# Patient Record
Sex: Female | Born: 1980
Health system: Southern US, Community
[De-identification: ages and names within clinical notes are randomized; demographics above are authoritative.]

## PROBLEM LIST (undated history)

## (undated) ENCOUNTER — Ambulatory Visit: Admission: EM | Discharge status: Absent without leave | Payer: 59

## (undated) DIAGNOSIS — F329 Major depressive disorder, single episode, unspecified: Secondary | ICD-10-CM

## (undated) DIAGNOSIS — K219 Gastro-esophageal reflux disease without esophagitis: Secondary | ICD-10-CM

## (undated) DIAGNOSIS — Z91018 Allergy to other foods: Secondary | ICD-10-CM

## (undated) DIAGNOSIS — M2550 Pain in unspecified joint: Secondary | ICD-10-CM

## (undated) DIAGNOSIS — E282 Polycystic ovarian syndrome: Secondary | ICD-10-CM

## (undated) DIAGNOSIS — N979 Female infertility, unspecified: Secondary | ICD-10-CM

## (undated) DIAGNOSIS — C4490 Unspecified malignant neoplasm of skin, unspecified: Secondary | ICD-10-CM

## (undated) DIAGNOSIS — I10 Essential (primary) hypertension: Secondary | ICD-10-CM

## (undated) DIAGNOSIS — E88819 Insulin resistance, unspecified: Secondary | ICD-10-CM

## (undated) DIAGNOSIS — F419 Anxiety disorder, unspecified: Secondary | ICD-10-CM

## (undated) DIAGNOSIS — E559 Vitamin D deficiency, unspecified: Secondary | ICD-10-CM

## (undated) DIAGNOSIS — E669 Obesity, unspecified: Secondary | ICD-10-CM

## (undated) DIAGNOSIS — N912 Amenorrhea, unspecified: Secondary | ICD-10-CM

## (undated) DIAGNOSIS — F32A Depression, unspecified: Secondary | ICD-10-CM

## (undated) DIAGNOSIS — F1010 Alcohol abuse, uncomplicated: Secondary | ICD-10-CM

## (undated) DIAGNOSIS — R0683 Snoring: Secondary | ICD-10-CM

## (undated) DIAGNOSIS — E8881 Metabolic syndrome: Secondary | ICD-10-CM

## (undated) DIAGNOSIS — G43109 Migraine with aura, not intractable, without status migrainosus: Secondary | ICD-10-CM

## (undated) DIAGNOSIS — E7800 Pure hypercholesterolemia, unspecified: Secondary | ICD-10-CM

## (undated) HISTORY — DX: Metabolic syndrome: E88.81

## (undated) HISTORY — DX: Migraine with aura, not intractable, without status migrainosus: G43.109

## (undated) HISTORY — DX: Vitamin D deficiency, unspecified: E55.9

## (undated) HISTORY — DX: Depression, unspecified: F32.A

## (undated) HISTORY — DX: Anxiety disorder, unspecified: F41.9

## (undated) HISTORY — DX: Snoring: R06.83

## (undated) HISTORY — DX: Insulin resistance, unspecified: E88.819

## (undated) HISTORY — DX: Allergy to other foods: Z91.018

## (undated) HISTORY — DX: Pure hypercholesterolemia, unspecified: E78.00

## (undated) HISTORY — DX: Major depressive disorder, single episode, unspecified: F32.9

## (undated) HISTORY — DX: Pain in unspecified joint: M25.50

## (undated) HISTORY — DX: Obesity, unspecified: E66.9

## (undated) HISTORY — DX: Amenorrhea, unspecified: N91.2

## (undated) HISTORY — DX: Polycystic ovarian syndrome: E28.2

## (undated) HISTORY — PX: WISDOM TOOTH EXTRACTION: SHX21

## (undated) HISTORY — DX: Female infertility, unspecified: N97.9

## (undated) HISTORY — DX: Alcohol abuse, uncomplicated: F10.10

## (undated) HISTORY — DX: Gastro-esophageal reflux disease without esophagitis: K21.9

## (undated) HISTORY — DX: Essential (primary) hypertension: I10

---

## 2001-09-30 ENCOUNTER — Other Ambulatory Visit: Admission: RE | Admit: 2001-09-30 | Discharge: 2001-09-30 | Payer: Self-pay | Admitting: Family Medicine

## 2004-06-21 ENCOUNTER — Ambulatory Visit: Payer: Self-pay | Admitting: Family Medicine

## 2004-07-09 DIAGNOSIS — N912 Amenorrhea, unspecified: Secondary | ICD-10-CM

## 2004-07-09 DIAGNOSIS — G43109 Migraine with aura, not intractable, without status migrainosus: Secondary | ICD-10-CM

## 2004-07-09 HISTORY — DX: Migraine with aura, not intractable, without status migrainosus: G43.109

## 2004-07-09 HISTORY — DX: Amenorrhea, unspecified: N91.2

## 2006-07-09 HISTORY — PX: SKIN CANCER DESTRUCTION: SHX778

## 2013-06-09 ENCOUNTER — Ambulatory Visit: Payer: Self-pay

## 2013-06-09 LAB — HM PAP SMEAR: HM Pap smear: NORMAL

## 2013-06-11 ENCOUNTER — Ambulatory Visit: Payer: Self-pay | Admitting: Family Medicine

## 2013-06-11 LAB — COMPREHENSIVE METABOLIC PANEL
Albumin: 3.9 g/dL (ref 3.4–5.0)
Alkaline Phosphatase: 52 U/L
Anion Gap: 9 (ref 7–16)
BUN: 11 mg/dL (ref 7–18)
Bilirubin,Total: 0.4 mg/dL (ref 0.2–1.0)
Calcium, Total: 8.7 mg/dL (ref 8.5–10.1)
Co2: 27 mmol/L (ref 21–32)
Creatinine: 0.79 mg/dL (ref 0.60–1.30)
Glucose: 96 mg/dL (ref 65–99)
Osmolality: 275 (ref 275–301)
Potassium: 3.8 mmol/L (ref 3.5–5.1)

## 2013-06-11 LAB — CBC WITH DIFFERENTIAL/PLATELET
Basophil #: 0 10*3/uL (ref 0.0–0.1)
Eosinophil #: 0.1 10*3/uL (ref 0.0–0.7)
HCT: 41.6 % (ref 35.0–47.0)
HGB: 14 g/dL (ref 12.0–16.0)
Lymphocyte #: 2.6 10*3/uL (ref 1.0–3.6)
MCHC: 33.7 g/dL (ref 32.0–36.0)
Monocyte #: 0.4 x10 3/mm (ref 0.2–0.9)
Monocyte %: 6.1 %
Neutrophil #: 3.9 10*3/uL (ref 1.4–6.5)
Neutrophil %: 55 %
RDW: 12.5 % (ref 11.5–14.5)
WBC: 7.1 10*3/uL (ref 3.6–11.0)

## 2013-06-11 LAB — TSH: Thyroid Stimulating Horm: 1.06 u[IU]/mL

## 2013-06-11 LAB — LIPID PANEL
Cholesterol: 175 mg/dL (ref 0–200)
Ldl Cholesterol, Calc: 123 mg/dL — ABNORMAL HIGH (ref 0–100)
Triglycerides: 52 mg/dL (ref 0–200)
VLDL Cholesterol, Calc: 10 mg/dL (ref 5–40)

## 2013-07-09 ENCOUNTER — Ambulatory Visit: Payer: Self-pay

## 2013-10-30 ENCOUNTER — Ambulatory Visit: Payer: Self-pay | Admitting: Family Medicine

## 2013-11-06 ENCOUNTER — Ambulatory Visit: Payer: Self-pay | Admitting: Family Medicine

## 2013-12-14 ENCOUNTER — Ambulatory Visit: Payer: Self-pay | Admitting: Family Medicine

## 2014-01-06 ENCOUNTER — Ambulatory Visit: Payer: Self-pay | Admitting: Family Medicine

## 2014-07-25 ENCOUNTER — Ambulatory Visit: Payer: Self-pay | Admitting: Physician Assistant

## 2014-11-07 ENCOUNTER — Ambulatory Visit
Admission: EM | Admit: 2014-11-07 | Discharge: 2014-11-07 | Disposition: A | Discharge status: Home / Self Care | Payer: 59 | Attending: Internal Medicine | Admitting: Internal Medicine

## 2014-11-07 ENCOUNTER — Ambulatory Visit: Payer: 59

## 2014-11-07 DIAGNOSIS — M2011 Hallux valgus (acquired), right foot: Secondary | ICD-10-CM | POA: Insufficient documentation

## 2014-11-07 DIAGNOSIS — M79671 Pain in right foot: Secondary | ICD-10-CM | POA: Diagnosis present

## 2014-11-07 DIAGNOSIS — Z85828 Personal history of other malignant neoplasm of skin: Secondary | ICD-10-CM | POA: Insufficient documentation

## 2014-11-07 DIAGNOSIS — Z833 Family history of diabetes mellitus: Secondary | ICD-10-CM | POA: Diagnosis present

## 2014-11-07 DIAGNOSIS — Z87891 Personal history of nicotine dependence: Secondary | ICD-10-CM | POA: Insufficient documentation

## 2014-11-07 DIAGNOSIS — Z8249 Family history of ischemic heart disease and other diseases of the circulatory system: Secondary | ICD-10-CM | POA: Diagnosis not present

## 2014-11-07 DIAGNOSIS — Z79899 Other long term (current) drug therapy: Secondary | ICD-10-CM | POA: Diagnosis present

## 2014-11-07 DIAGNOSIS — M21611 Bunion of right foot: Secondary | ICD-10-CM

## 2014-11-07 HISTORY — DX: Unspecified malignant neoplasm of skin, unspecified: C44.90

## 2014-11-07 NOTE — Discharge Instructions (Signed)

## 2014-11-07 NOTE — ED Provider Notes (Signed)
CSN: [id number]     Arrival date & time 11/07/14  1655 History   First MD Initiated Contact with Patient 11/07/14 1032     Chief Complaint  Patient presents with  . Foot Pain   (Consider location/radiation/quality/duration/timing/severity/associated sxs/prior Treatment) HPI Comments: Right foot pain worsening over past year  The history is provided by the patient.   Patient denied trauma, has noticed swelling and radiating pain across top of right foot.  Tried shoes with large toebox no improvement. Past Medical History  Diagnosis Date  . Skin cancer    History reviewed. No pertinent past surgical history. Family History  Problem Relation Age of Onset  . Hypertension Father   . Diabetes Father    History  Substance Use Topics  . Smoking status: Former Smoker    Quit date: 07/10/2003  . Smokeless tobacco: Never Used  . Alcohol Use: 1.2 oz/week    1 Glasses of wine, 1 Cans of beer per week   OB History    No data available     Review of Systems  Constitutional: Negative.   HENT: Negative.   Eyes: Negative.   Respiratory: Negative.   Cardiovascular: Negative.   Gastrointestinal: Negative.   Musculoskeletal: Positive for joint swelling and arthralgias.  Skin: Negative.   Neurological: Negative.   Hematological: Negative.     Allergies  Celery oil; Codeine; and Pollen extract  Home Medications   Prior to Admission medications   Medication Sig Start Date End Date Taking? Authorizing Provider  citalopram (CELEXA) 10 MG tablet Take 10 mg by mouth daily.   Yes Historical Provider, MD  Multiple Vitamin (MULTIVITAMIN WITH MINERALS) TABS tablet Take 1 tablet by mouth daily.   Yes Historical Provider, MD   BP 117/74 mmHg  Pulse 80  Temp(Src) 97.1 F (36.2 C) (Tympanic)  Resp 16  Ht 5\' 3"  (1.6 m)  Wt 193 lb (87.544 kg)  BMI 34.20 kg/m2  SpO2 99%  LMP  (Within Weeks) Physical Exam  Constitutional: She is oriented to person, place, and time. She appears  well-developed and well-nourished. No distress.  HENT:  Head: Normocephalic and atraumatic.  Eyes: Conjunctivae and EOM are normal. Pupils are equal, round, and reactive to light.  Neck: Normal range of motion. Neck supple.  Cardiovascular: Normal rate, regular rhythm and intact distal pulses.   Pulmonary/Chest: Breath sounds normal.  Musculoskeletal: Normal range of motion.       Feet:  Neurological: She is alert and oriented to person, place, and time. She has normal reflexes.  Skin: Skin is warm and dry. She is not diaphoretic.  Psychiatric: She has a normal mood and affect. Her behavior is normal. Judgment and thought content normal.  Nursing note and vitals reviewed.   ED Course  Procedures (including critical care time) Labs Review Labs Reviewed - No data to display Discussed radiology results with patient and given copy of report.  Discussed to follow up with [facility] for podiatry referral if required by insurance or schedule on own at provider of choice.  May request copy of foot xray images from radiology dept on disk.  May start motrin 800mg  po TID or tylenol 1000mg  po QID prn pain, heat, stretches and elevation at home, ensure toe box shoes not pinching toes.  Patient verbalized understanding of information/instructions, agreed with plan of care and had no further questions at this time.  Imaging Review Dg Foot Complete Right  11/07/2014   CLINICAL DATA:  Metatarsal phalangeal joint pain and swelling, with  progression over past year. No known injury.  EXAM: RIGHT FOOT COMPLETE - 3+ VIEW  COMPARISON:  None.  FINDINGS: There is no evidence of fracture or dislocation.  Mild to moderate hallux valgus is seen with bony bunion formation involving the distal first metatarsal. No other osseous abnormality identified.  IMPRESSION: No acute findings.  Mild to moderate hallux valgus with bony bunion.   Electronically Signed   By: Earle Gell M.D.   On: 11/07/2014 14:31     MDM   1. Hallux  valgus with bunions of right foot       Olen Cordial, NP 11/07/14 1949

## 2014-11-07 NOTE — ED Notes (Signed)
Right foot pain thinks she is having a bunion on big toe inside portion.

## 2014-12-14 ENCOUNTER — Ambulatory Visit: Payer: Self-pay | Admitting: Podiatry

## 2014-12-31 ENCOUNTER — Encounter: Payer: Self-pay | Admitting: Family Medicine

## 2015-01-31 ENCOUNTER — Ambulatory Visit
Admission: EM | Admit: 2015-01-31 | Discharge: 2015-01-31 | Disposition: A | Discharge status: Home / Self Care | Payer: 59 | Attending: Family Medicine | Admitting: Family Medicine

## 2015-01-31 DIAGNOSIS — S93401A Sprain of unspecified ligament of right ankle, initial encounter: Secondary | ICD-10-CM | POA: Diagnosis not present

## 2015-01-31 MED ORDER — ACETAMINOPHEN 500 MG PO TABS: 500.0000 mg | ORAL_TABLET | Freq: Four times a day (QID) | ORAL | Status: DC | PRN

## 2015-01-31 NOTE — ED Notes (Signed)
States twisted right ankle Saturday afternoon. Painful to move but not when bearing weight. + swelling right outer ankle

## 2015-01-31 NOTE — Discharge Instructions (Signed)

## 2015-01-31 NOTE — ED Provider Notes (Signed)
CSN: [id number]     Arrival date & time 01/31/15  1256 History   First MD Initiated Contact with Patient 01/31/15 1312     Chief Complaint  Patient presents with  . 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  Has been walking in exercise shoes yesterday and today without difficulty unless she makes sharp movement.  Iced and elevated yesterday at home.  Denied bruising right foot, leg pain, toe pain or injuring any other extremities, previous sprain to right ankle.  Patient is a 34 y.o. female presenting with ankle pain. The history is provided by the patient.  Ankle Pain Location:  Ankle Time since incident:  36 hours Injury: yes   Ankle location:  R ankle Pain details:    Quality:  Aching   Radiates to:  Does not radiate   Severity:  Mild   Onset quality:  Sudden   Duration:  36 hours   Timing:  Intermittent   Progression:  Unchanged Chronicity:  New Dislocation: no   Foreign body present:  No foreign bodies Tetanus status:  Up to date Prior injury to area:  No Relieved by:  Rest, ice and elevation Worsened by:  Rotation Ineffective treatments:  Rest, ice and elevation Associated symptoms: swelling   Associated symptoms: no back pain, no decreased ROM, no fatigue, no fever, no itching, no muscle weakness, no neck pain, no numbness and no tingling   Risk factors: obesity   Risk factors: no concern for non-accidental trauma, no frequent fractures, no known bone disorder and no recent illness     Past Medical History  Diagnosis Date  . Skin cancer    History reviewed. No pertinent past surgical history. Family History  Problem Relation Age of Onset  . Hypertension Father   . Diabetes Father    History  Substance  Use Topics  . Smoking status: Former Smoker    Quit date: 07/10/2003  . Smokeless tobacco: Never Used  . Alcohol Use: 1.2 oz/week    1 Glasses of wine, 1 Cans of beer per week     Comment: socially   OB History    No data available     Review of Systems  Constitutional: Negative for fever, chills, diaphoresis, activity change, appetite change and fatigue.  HENT: Negative for dental problem, ear discharge and nosebleeds.   Eyes: Negative for discharge and itching.  Respiratory: Negative for cough and wheezing.   Cardiovascular: Negative for chest pain and leg swelling.  Gastrointestinal: Negative for nausea, vomiting, abdominal pain, diarrhea, constipation, blood in stool and abdominal distention.  Endocrine: Negative for cold intolerance and heat intolerance.  Genitourinary: Negative for dysuria and hematuria.  Musculoskeletal: Positive for joint swelling. Negative for myalgias, back pain, arthralgias, gait problem, neck pain and neck stiffness.  Skin: Negative for color change, itching, pallor, rash and wound.  Allergic/Immunologic: Negative for environmental allergies and food allergies.  Neurological: Negative for dizziness, tremors, syncope, facial asymmetry, weakness, light-headedness and headaches.  Hematological: Negative for adenopathy. Does not bruise/bleed easily.  Psychiatric/Behavioral: Negative for behavioral problems, confusion, sleep disturbance and agitation.    Allergies  Celery oil; Codeine; and Pollen extract  Home Medications   Prior to Admission medications   Medication Sig Start Date End Date Taking? Authorizing Provider  citalopram (CELEXA) 10 MG tablet Take 10 mg by  mouth daily.   Yes Historical Provider, MD  Garlic 510 MG TABS Take by mouth.   Yes Historical Provider, MD  Multiple Vitamin (MULTIVITAMIN WITH MINERALS) TABS tablet Take 1 tablet by mouth daily.   Yes Historical Provider, MD  acetaminophen (TYLENOL) 500 MG tablet Take 1 tablet (500 mg  total) by mouth every 6 (six) hours as needed. 01/31/15   Aura Fey Betancourt, NP   BP 121/60 mmHg  Pulse 74  Temp(Src) 98.5 F (36.9 C) (Oral)  Resp 16  Ht 5\' 3"  (1.6 m)  Wt 190 lb (86.183 kg)  BMI 33.67 kg/m2  SpO2 99%  LMP 01/02/2015 (Approximate) Physical Exam  Constitutional: She is oriented to person, place, and time. Vital signs are normal. She appears well-developed and well-nourished. No distress.  HENT:  Head: Normocephalic and atraumatic.  Right Ear: External ear normal.  Left Ear: External ear normal.  Nose: Nose normal.  Mouth/Throat: Oropharynx is clear and moist.  Eyes: Conjunctivae, EOM and lids are normal. Pupils are equal, round, and reactive to light. Right eye exhibits no discharge. Left eye exhibits no discharge. No scleral icterus.  Neck: Trachea normal and normal range of motion. Neck supple. No tracheal deviation present.  Cardiovascular: Normal rate, regular rhythm and intact distal pulses.   Pulmonary/Chest: Effort normal and breath sounds normal. No stridor. No respiratory distress. She has no wheezes.  Abdominal: Soft. She exhibits no distension.  Musculoskeletal: She exhibits edema and tenderness.       Right knee: Normal.       Left knee: Normal.       Right ankle: She exhibits decreased range of motion and swelling. She exhibits no ecchymosis, no deformity, no laceration and normal pulse. Tenderness. AITFL tenderness found. No lateral malleolus, no medial malleolus, no CF ligament, no posterior TFL, no head of 5th metatarsal and no proximal fibula tenderness found. Achilles tendon normal. Achilles tendon exhibits no pain and no defect.       Left ankle: Normal.       Right lower leg: Normal.       Left lower leg: Normal.       Right foot: Normal.       Left foot: Normal.       Feet:  Neurological: She is alert and oriented to person, place, and time. She exhibits normal muscle tone. Coordination normal.  Skin: Skin is warm, dry and intact. No abrasion,  no bruising, no burn, no ecchymosis, no laceration, no lesion, no petechiae and no rash noted. She is not diaphoretic. No erythema. No pallor.  Psychiatric: She has a normal mood and affect. Her speech is normal and behavior is normal. Judgment and thought content normal. Cognition and memory are normal.  Nursing note and vitals reviewed.   ED Course  Procedures (including critical care time) Labs Review Labs Reviewed - No data to display  Imaging Review No results found.   MDM   1. Ankle sprain, right, initial encounter    Patient was instructed to rest, ice and elevate the ankle as much as possible.  Activity as tolerated and work on ROM exercises.  Patient is to take NSAIDS as needed tylenol 1000mg  po QID prn.  Discussed at risk to reinjure ankle over the next year and to wear supportive footwear/ankle sleeve/ace bandage.  Follow up with PCM if symptoms persist greater than 4 weeks for re-evaluation.  Patient refused work note and offer of crutches discussed if changing gait can cause foot/ankle tendonitis in addition  to sprain.  Follow up for re-evaluation in 7 to 10 days if symptoms worsening.  Patient verbalized agreement and understanding of treatment plan.   P2:  Injury Prevention and Fitness.    Olen Cordial, NP 01/31/15 [phone]

## 2015-10-06 ENCOUNTER — Encounter: Payer: 59 | Attending: Family Medicine | Admitting: Dietician

## 2015-10-06 ENCOUNTER — Encounter: Payer: Self-pay | Admitting: Dietician

## 2015-10-06 VITALS — Ht 63.0 in | Wt 200.7 lb

## 2015-10-06 DIAGNOSIS — Z713 Dietary counseling and surveillance: Secondary | ICD-10-CM | POA: Diagnosis not present

## 2015-10-06 DIAGNOSIS — E669 Obesity, unspecified: Secondary | ICD-10-CM

## 2015-10-06 NOTE — Progress Notes (Signed)
Medical Nutrition Therapy:  Appt start time: [id number] end time:  [id number].   Assessment:  Primary concerns today: Leah Brown is here today since she is having a hard time losing weight. Feeling very frustrated. Weight has been the same for the past 6-12 months. Normal weight is 180 lbs for the past 10 years and she was 170 lbs about a year ago. Has cut back on soda and is drinking sparkling water. Other than that cannot figure out what is different about what she was doing then versus now. Had a new job that was stressful but is getting less so. Moves less with this job than previous job. Exercises now when she used to not. Feeling hungry all of the time which is she used to feel also. Doesn't feel full when she eats. Feels like portion sizes are large. Eats meals quickly, when she is driving, and eats lunch and dinner talking with someone.   Describes herself as a "caffeine addict" and takes a supplement each day. Feels sluggish if she doesn't take it. Gets 7-8 hours of sleep per night. Has been tested for sleep apnea 3 or more years ago and did not have it. Thyroid was normal several years ago. Was negative for PCOS.   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 Lives with her boyfriend and states that he does the meal preparation at home. Has been doing small or no dinner recently. Eats out 1-2 meals per week.   Family members tend to be on the larger size. Would like to be able to do yoga, be healthy, and feel better in her own skin. Felt best around 150 lbs.    Preferred Learning Style:   No preference indicated   Learning Readiness:   Ready  MEDICATIONS: see list   DIETARY INTAKE:  Usual eating pattern includes 3 meals and 2 snacks per day.  Avoided foods include: doesn't like a lot of meat, almonds   24-hr recall:  B (8 AM): V8 juice, bagel or toast plain or butter  Snk ( 10-10:30 AM): granola bar   L ( PM): lean meat and vegetable - salad with meat  sometimes with a carb Snk ( PM): on drive home - apple or banana D ( PM): red meat and vegetable or protein shake or cheese stick with crackers Snk ( PM): none Beverages: water, coffee with 1/2 and 1/2 cream and sometimes sugar, sparkling water, coconut water, hot tea   Usual physical activity: 2 x week - cardio sculpt, spin, and pump and sculpt  Estimated energy needs: 1800 calories 200 g carbohydrates 135 g protein 50 g fat  Progress Towards Goal(s):  In progress.   Nutritional Diagnosis:  [place]-3.3 Overweight/obesity As related to hx of large portion sizing and eating quickly.  As evidenced by BMI of 35.6.    Intervention:  Nutrition counseling provided. Plan: Fill up half of your plate with vegetables at lunch and dinner meals.  Have a quarter of your plate protein and a quarter of your plate starch or fruit. Try adding and egg or other protein breakfast meals/snacks (egg, protein shake, nuts, cheese).  Try using small plates to help with portion sizes. Aim to take 20 minutes to eat meals. Chew 20-30 x per bite and eat without distractions when possible. Enjoy your food! Pay attention to hunger/fullness feelings. Eat when you are hungry and stop when you have had enough.  Teaching Method Utilized:  Ship broker  Hands on  Handouts given during visit include:  MyPlate Handout  15 g CHO Snacks  Barriers to learning/adherence to lifestyle change: none  Demonstrated degree of understanding via:  Teach Back   Monitoring/Evaluation:  Dietary intake, exercise, and body weight in 1 month(s).

## 2015-10-06 NOTE — Patient Instructions (Signed)
Fill up half of your plate with vegetables at lunch and dinner meals.  Have a quarter of your plate protein and a quarter of your plate starch or fruit. Try adding and egg or other protein breakfast meals/snacks (egg, protein shake, nuts, cheese).  Try using small plates to help with portion sizes. Aim to take 20 minutes to eat meals. Chew 20-30 x per bite and eat without distractions when possible. Enjoy your food! Pay attention to hunger/fullness feelings. Eat when you are hungry and stop when you have had enough.

## 2015-11-01 ENCOUNTER — Encounter: Payer: Self-pay | Admitting: Physician Assistant

## 2015-11-01 ENCOUNTER — Ambulatory Visit: Payer: Self-pay | Admitting: Physician Assistant

## 2015-11-01 VITALS — BP 110/80 | HR 80 | Temp 99.1°F

## 2015-11-01 DIAGNOSIS — R197 Diarrhea, unspecified: Secondary | ICD-10-CM

## 2015-11-01 DIAGNOSIS — R509 Fever, unspecified: Secondary | ICD-10-CM

## 2015-11-01 DIAGNOSIS — R110 Nausea: Secondary | ICD-10-CM

## 2015-11-01 LAB — POCT URINALYSIS DIPSTICK
Bilirubin, UA: NEGATIVE
GLUCOSE UA: NEGATIVE
Ketones, UA: NEGATIVE
Leukocytes, UA: NEGATIVE
Nitrite, UA: NEGATIVE
Protein, UA: NEGATIVE
SPEC GRAV UA: 1.015
Urobilinogen, UA: 0.2
pH, UA: 6

## 2015-11-01 LAB — POCT INFLUENZA A/B
Influenza A, POC: NEGATIVE
Influenza B, POC: NEGATIVE

## 2015-11-01 LAB — POCT URINE PREGNANCY: Preg Test, Ur: NEGATIVE

## 2015-11-01 NOTE — Progress Notes (Signed)
S:  Pt c/o nausea and diarrhea, sx for 1 day, +fever/chills, no abd pain except for cramping with diarrhea; denies cp/sob, denies camping, bad food, recent antibiotics, or exposure to bad water, ?if pregnant Remainder ros neg  O:  Vitals wnl, nad, ENT wnl, neck supple no lymph, lungs c t a, cv rrr, abd soft nontender bs increased lower quads b/l, neuro intact, flu swab neg, ua 3+ blood, urine preg neg  A:  Viral gastroenteritis  P:  Reassurance, fluids, brat diet, immodium ad for diarrhea if needed, return if not better in 3 days, return earlier if worsening, if need something for nausea will call in phenergan or zofran

## 2015-11-08 ENCOUNTER — Encounter: Payer: Self-pay | Admitting: Dietician

## 2015-11-08 ENCOUNTER — Encounter: Payer: 59 | Attending: Family Medicine | Admitting: Dietician

## 2015-11-08 VITALS — Ht 63.0 in | Wt 200.0 lb

## 2015-11-08 DIAGNOSIS — E669 Obesity, unspecified: Secondary | ICD-10-CM

## 2015-11-08 DIAGNOSIS — Z713 Dietary counseling and surveillance: Secondary | ICD-10-CM | POA: Insufficient documentation

## 2015-11-08 NOTE — Progress Notes (Signed)
  Medical Nutrition Therapy:  Appt start time: 1720 end time:  1745   Assessment:  Primary concerns today: Leah Brown is here for a follow up for weight loss. Has not lost weight. Had been working on eating slower for about 2 weeks and then slipped back into old habits. Felt like she wasn't eating as much in one sitting when she was eating slower or was feeling more satisfied. Has been sick and has not exercised in 2 weeks. Also was eating more carbs when sick. Also had her birthday and probably ate more than usual. No really having any changes to her diet.  Got some smaller plates. Trying to not have seconds.    Family members tend to be on the larger size. Would like to be able to do yoga, be healthy, and feel better in her own skin. Felt best around 150 lbs.    Preferred Learning Style:   No preference indicated   Learning Readiness:   Ready  MEDICATIONS: see list   DIETARY INTAKE:  Usual eating pattern includes 3 meals and 2 snacks per day.  Avoided foods include: doesn't like a lot of meat, almonds   24-hr recall:  B (8 AM): V8 juice premier protein and shake Snk ( 10-10:30 AM): granola bar   L ( PM): lean meat and vegetable - salad with meat sometimes with a carb Snk ( PM): on drive home - apple or banana D ( PM): red meat and vegetable or protein shake or cheese stick with crackers Snk ( PM): none Beverages: water, coffee with 1/2 and 1/2 cream and sometimes sugar, sparkling water, coconut water, hot tea   Usual physical activity: 2 x week - cardio sculpt, spin, and pump and sculpt  Estimated energy needs: 1800 calories 200 g carbohydrates 135 g protein 50 g fat  Progress Towards Goal(s):  In progress.   Nutritional Diagnosis:  [place]-3.3 Overweight/obesity As related to hx of large portion sizing and eating quickly.  As evidenced by BMI of 35.6.    Intervention:  Nutrition counseling provided. Plan: Make sure to have vegetable at meals, consider having prepped raw  vegetables around. Try ranch with plain nonfat greek yogurt with [employer].  Aim to take 20 minutes to eat meals. Chew 20-30 x per bite and eat without distractions when possible. Enjoy your food! Pay attention to hunger/fullness feelings. Eat when you are hungry and stop when you have had enough. Try to journal food and hunger feelings. Look for patterns. Consider - how do you know when you are hungry?   Teaching Method Utilized:  Visual Auditory Hands on  Handouts given during visit include:  none  Barriers to learning/adherence to lifestyle change: none  Demonstrated degree of understanding via:  Teach Back   Monitoring/Evaluation:  Dietary intake, exercise, and body weight in 1 month(s).

## 2015-11-08 NOTE — Patient Instructions (Addendum)
Make sure to have vegetable at meals, consider having prepped raw vegetables around. Try ranch with plain nonfat greek yogurt with [employer].  Aim to take 20 minutes to eat meals. Chew 20-30 x per bite and eat without distractions when possible. Enjoy your food! Pay attention to hunger/fullness feelings. Eat when you are hungry and stop when you have had enough. Try to journal food and hunger feelings. Look for patterns. Consider - how do you know when you are hungry?

## 2015-12-02 ENCOUNTER — Encounter: Payer: Self-pay | Admitting: Family Medicine

## 2015-12-02 ENCOUNTER — Ambulatory Visit (INDEPENDENT_AMBULATORY_CARE_PROVIDER_SITE_OTHER): Payer: 59 | Admitting: Family Medicine

## 2015-12-02 VITALS — BP 126/76 | HR 73 | Temp 98.8°F | Resp 16 | Ht 63.0 in | Wt 203.0 lb

## 2015-12-02 DIAGNOSIS — N889 Noninflammatory disorder of cervix uteri, unspecified: Secondary | ICD-10-CM

## 2015-12-02 DIAGNOSIS — E669 Obesity, unspecified: Secondary | ICD-10-CM | POA: Diagnosis not present

## 2015-12-02 DIAGNOSIS — Z124 Encounter for screening for malignant neoplasm of cervix: Secondary | ICD-10-CM | POA: Diagnosis not present

## 2015-12-02 DIAGNOSIS — Z8041 Family history of malignant neoplasm of ovary: Secondary | ICD-10-CM | POA: Diagnosis not present

## 2015-12-02 DIAGNOSIS — Z0000 Encounter for general adult medical examination without abnormal findings: Secondary | ICD-10-CM | POA: Diagnosis not present

## 2015-12-02 DIAGNOSIS — J302 Other seasonal allergic rhinitis: Secondary | ICD-10-CM | POA: Diagnosis not present

## 2015-12-02 DIAGNOSIS — R0683 Snoring: Secondary | ICD-10-CM

## 2015-12-02 DIAGNOSIS — Z114 Encounter for screening for human immunodeficiency virus [HIV]: Secondary | ICD-10-CM | POA: Diagnosis not present

## 2015-12-02 HISTORY — DX: Snoring: R06.83

## 2015-12-02 MED ORDER — FLUCONAZOLE 150 MG PO TABS: 150.0000 mg | ORAL_TABLET | Freq: Once | ORAL | Status: DC

## 2015-12-02 NOTE — Progress Notes (Signed)
Patient ID: Leah Brown, female   DOB: 1980-08-11, 35 y.o.   MRN: [id number]   Subjective:   Leah Brown is a 35 y.o. female here for a complete physical exam  Interim issues since last visit: no medical excitement  USPSTF grade A and B recommendations Alcohol: socially, much less than 7 per week Depression:  Depression screen [facility] 2/9 12/02/2015 11/08/2015 10/06/2015  Decreased Interest 0 0 0  Down, Depressed, Hopeless 0 0 0  PHQ - 2 Score 0 0 0   Hypertension: no, controlled Obesity: struggled with weight since 2005; ongoing; has been checked for thyroid issues, not PCOS; does spin class, exercises; sleepy a lot; had sleep study done; snores Tobacco use: hx of, quit 2006 HIV: interested STD testing and prevention (chl/gon/syphilis): asx, willing to get tested Lipids: another day, not fasting Glucose: another day, not fasting Colorectal cancer: no 1st degrees fam Breast cancer: no 1st or 2nd degrees fam BRCA gene screening: no breast cancer; already had the screening done (GM and aunt had ovarian cancer) Intimate partner violence: no Cervical cancer screening: no abnormal pap smears; requesting one today Lung cancer: n/a Osteoporosis: n/a Fall prevention/vitamin D: 1000 iu vit D3 daily AAA: n/a Aspirin: n/a Diet: larger portions, normal healthy food; rare fast food and processed; does have coffee but limiting calories from cream and sugar; low calorie grapefruit juice, diet drinks Exercise: doing exercise, spin classes and cardio scuplt class Skin cancer: hx of skin cancer, left breast, mole on right side scalp; watching it (NOT melanoma)  She is interested in starting a family in about a year; asked if anything she should be doing now   Past Medical History  Diagnosis Date  . Anxiety   . Depression   . Multiple food allergies   . Obesity   . Skin cancer   . Ophthalmic migraine 2006  . Amenorrhea 2006    Ongoing since 2006  . Snores 12/02/2015   Past Surgical  History  Procedure Laterality Date  . Skin cancer destruction Left 2008    Breast   Family History  Problem Relation Age of Onset  . Hypertension Father   . Diabetes Father   . Obesity Father   . Appendicitis Mother 60  . Ovarian cancer Maternal Aunt   . Ovarian cancer Maternal Grandmother   added: Ovarian GM and aunt (maternal)  Social History  Substance Use Topics  . Smoking status: Former Smoker -- 0.50 packs/day for 4 years    Types: Cigarettes    Start date: 07/10/1999    Quit date: 07/10/2003  . Smokeless tobacco: Never Used  . Alcohol Use: 1.2 oz/week    1 Glasses of wine, 1 Cans of beer per week     Comment: socially   Review of Systems  Constitutional: Negative for unexpected weight change.  HENT: Positive for ear pain (pressure with allergies).   Eyes: Negative for visual disturbance.  Respiratory: Negative for cough, shortness of breath and wheezing.   Cardiovascular: Negative for chest pain and leg swelling.  Gastrointestinal: Negative for blood in stool.  Endocrine: Positive for heat intolerance. Negative for polydipsia and polyuria.  Genitourinary: Positive for menstrual problem. Negative for hematuria, vaginal bleeding, vaginal discharge and pelvic pain.  Musculoskeletal: Negative for joint swelling and arthralgias.  Skin:       Watching mole right temple in side hairline  Allergic/Immunologic: Positive for environmental allergies.  Neurological: Negative for tremors.  Hematological: Does not bruise/bleed easily.  Psychiatric/Behavioral: Negative for dysphoric  mood.    Objective:   Filed Vitals:   12/02/15 1427  BP: 126/76  Pulse: 73  Temp: 98.8 F (37.1 C)  TempSrc: Oral  Resp: 16  Height: 5' 3"  (1.6 m)  Weight: 203 lb (92.08 kg)  SpO2: 98%   Body mass index is 35.97 kg/(m^2). Wt Readings from Last 3 Encounters:  12/02/15 203 lb (92.08 kg)  11/08/15 200 lb (90.719 kg)  10/06/15 200 lb 11.2 oz (91.037 kg)   Physical Exam  Constitutional:  She appears well-developed and well-nourished.  obese  HENT:  Head: Normocephalic and atraumatic.  Eyes: Conjunctivae and EOM are normal. Right eye exhibits no hordeolum. Left eye exhibits no hordeolum. No scleral icterus.  Neck: Carotid bruit is not present. No thyromegaly present.  Cardiovascular: Normal rate, regular rhythm, S1 normal, S2 normal and normal heart sounds.   No extrasystoles are present.  Pulmonary/Chest: Effort normal and breath sounds normal. No respiratory distress. Right breast exhibits no inverted nipple, no mass, no nipple discharge, no skin change and no tenderness. Left breast exhibits no inverted nipple, no mass, no nipple discharge, no skin change and no tenderness. Breasts are symmetrical.  Surgical scar lateral left breast  Abdominal: Soft. Normal appearance and bowel sounds are normal. She exhibits no distension, no abdominal bruit, no pulsatile midline mass and no mass. There is no hepatosplenomegaly. There is no tenderness. No hernia.  Genitourinary: Uterus normal. Pelvic exam was performed with patient prone. There is no rash or lesion on the right labia. There is no rash or lesion on the left labia. Cervix exhibits friability. Cervix exhibits no motion tenderness. Right adnexum displays no mass, no tenderness and no fullness. Left adnexum displays no mass, no tenderness and no fullness. Vaginal discharge (clumpy white) found.  Easily bled, what appears to be small polyp 12 to 1 o'clock position of cervix at os, but visualization difficult because of bleeding  Musculoskeletal: Normal range of motion. She exhibits no edema.  Lymphadenopathy:       Head (right side): No submandibular adenopathy present.       Head (left side): No submandibular adenopathy present.    She has no cervical adenopathy.    She has no axillary adenopathy.  Neurological: She is alert. She displays no tremor. No cranial nerve deficit. She exhibits normal muscle tone. Gait normal.  Skin: Skin  is warm and dry. No bruising and no ecchymosis noted. No cyanosis. No pallor.  Psychiatric: Her speech is normal and behavior is normal. Judgment and thought content normal. Her mood appears not anxious. Her affect is not labile and not inappropriate. She does not exhibit a depressed mood.    Assessment/Plan:   Problem List Items Addressed This Visit      Respiratory   Other seasonal allergic rhinitis   Relevant Orders   Ambulatory referral to ENT     Genitourinary   Abnormal cervix finding    Possible small polyp at os, excessive bright red bleeding with pap collection; refer to GYN      Relevant Orders   Ambulatory referral to Gynecology     Other   Cervical cancer screening    Thin prep collected      Relevant Orders   IGP, rfx Aptima HPV ASCU   Family hx of ovarian malignancy    Patient was tested for BRCA and was negative per her report      Obesity    Encouraged working on weight and diet now, prior to considering starting  a family; get in training now, get body in shape      Preventative health care - Primary    USPSTF grade A and B recommendations reviewed with patient; age-appropriate recommendations, preventive care, screening tests, etc discussed and encouraged; healthy living encouraged; see AVS for patient education given to patient      Relevant Orders   CBC with Differential/Platelet   Comprehensive metabolic panel   Lipid Panel w/o Chol/HDL Ratio   Vitamin B12   VITAMIN D 25 Hydroxy (Vit-D Deficiency, Fractures)   TSH   Screening for HIV (human immunodeficiency virus)    Discussed one-time HIV screening recommendation per USPSTF guidelines; patient agrees with testing; HIV antibody ordered      Relevant Orders   HIV antibody   Snores    Obese; refer to ENT      Relevant Orders   Ambulatory referral to ENT       Meds ordered this encounter  Medications  . cetirizine (ZYRTEC) 10 MG tablet    Sig: Take 10 mg by mouth daily.   .  fluconazole (DIFLUCAN) 150 MG tablet    Sig: Take 1 tablet (150 mg total) by mouth once.    Dispense:  1 tablet    Refill:  0   Orders Placed This Encounter  Procedures  . CBC with Differential/Platelet  . Comprehensive metabolic panel    Order Specific Question:  Has the patient fasted?    Answer:  Yes  . HIV antibody  . Lipid Panel w/o Chol/HDL Ratio    Order Specific Question:  Has the patient fasted?    Answer:  Yes  . Vitamin B12  . VITAMIN D 25 Hydroxy (Vit-D Deficiency, Fractures)  . TSH  . Ambulatory referral to ENT    Referral Priority:  Routine    Referral Type:  Consultation    Referral Reason:  Specialty Services Required    Requested Specialty:  Otolaryngology    Number of Visits Requested:  1  . Ambulatory referral to Gynecology    Referral Priority:  Routine    Referral Type:  Consultation    Referral Reason:  Specialty Services Required    Requested Specialty:  Gynecology    Number of Visits Requested:  1    Follow up plan: Return in about 1 year (around 12/01/2016) for complete physical.  An after-visit summary was printed and given to the patient at [place].  Please see the patient instructions which may contain other information and recommendations beyond what is mentioned above in the assessment and plan.

## 2015-12-02 NOTE — Patient Instructions (Addendum)
I've put in a referral for you to see ENT Please have fasting labs done at your convenience  Health Maintenance, Female Adopting a healthy lifestyle and getting preventive care can go a long way to promote health and wellness. Talk with your health care provider about what schedule of regular examinations is right for you. This is a good chance for you to check in with your provider about disease prevention and staying healthy. In between checkups, there are plenty of things you can do on your own. Experts have done a lot of research about which lifestyle changes and preventive measures are most likely to keep you healthy. Ask your health care provider for more information. WEIGHT AND DIET  Eat a healthy diet  Be sure to include plenty of vegetables, fruits, low-fat dairy products, and lean protein.  Do not eat a lot of foods high in solid fats, added sugars, or salt.  Get regular exercise. This is one of the most important things you can do for your health.  Most adults should exercise for at least 150 minutes each week. The exercise should increase your heart rate and make you sweat (moderate-intensity exercise).  Most adults should also do strengthening exercises at least twice a week. This is in addition to the moderate-intensity exercise.  Maintain a healthy weight  Body mass index (BMI) is a measurement that can be used to identify possible weight problems. It estimates body fat based on height and weight. Your health care provider can help determine your BMI and help you achieve or maintain a healthy weight.  For females 83 years of age and older:   A BMI below 18.5 is considered underweight.  A BMI of 18.5 to 24.9 is normal.  A BMI of 25 to 29.9 is considered overweight.  A BMI of 30 and above is considered obese.  Watch levels of cholesterol and blood lipids  You should start having your blood tested for lipids and cholesterol at 34 years of age, then have this test every  5 years.  You may need to have your cholesterol levels checked more often if:  Your lipid or cholesterol levels are high.  You are older than 34 years of age.  You are at high risk for heart disease.  CANCER SCREENING   Lung Cancer  Lung cancer screening is recommended for adults 34-37 years old who are at high risk for lung cancer because of a history of smoking.  A yearly low-dose CT scan of the lungs is recommended for people who:  Currently smoke.  Have quit within the past 15 years.  Have at least a 30-pack-year history of smoking. A pack year is smoking an average of one pack of cigarettes a day for 1 year.  Yearly screening should continue until it has been 15 years since you quit.  Yearly screening should stop if you develop a health problem that would prevent you from having lung cancer treatment.  Breast Cancer  Practice breast self-awareness. This means understanding how your breasts normally appear and feel.  It also means doing regular breast self-exams. Let your health care provider know about any changes, no matter how small.  If you are in your 20s or 30s, you should have a clinical breast exam (CBE) by a health care provider every 1-3 years as part of a regular health exam.  If you are 89 or older, have a CBE every year. Also consider having a breast X-ray (mammogram) every year.  If you  have a family history of breast cancer, talk to your health care provider about genetic screening.  If you are at high risk for breast cancer, talk to your health care provider about having an MRI and a mammogram every year.  Breast cancer gene (BRCA) assessment is recommended for women who have family members with BRCA-related cancers. BRCA-related cancers include:  Breast.  Ovarian.  Tubal.  Peritoneal cancers.  Results of the assessment will determine the need for genetic counseling and BRCA1 and BRCA2 testing. Cervical Cancer Your health care provider may  recommend that you be screened regularly for cancer of the pelvic organs (ovaries, uterus, and vagina). This screening involves a pelvic examination, including checking for microscopic changes to the surface of your cervix (Pap test). You may be encouraged to have this screening done every 3 years, beginning at age 50.  For women ages 72-65, health care providers may recommend pelvic exams and Pap testing every 3 years, or they may recommend the Pap and pelvic exam, combined with testing for human papilloma virus (HPV), every 5 years. Some types of HPV increase your risk of cervical cancer. Testing for HPV may also be done on women of any age with unclear Pap test results.  Other health care providers may not recommend any screening for nonpregnant women who are considered low risk for pelvic cancer and who do not have symptoms. Ask your health care provider if a screening pelvic exam is right for you.  If you have had past treatment for cervical cancer or a condition that could lead to cancer, you need Pap tests and screening for cancer for at least 20 years after your treatment. If Pap tests have been discontinued, your risk factors (such as having a new sexual partner) need to be reassessed to determine if screening should resume. Some women have medical problems that increase the chance of getting cervical cancer. In these cases, your health care provider may recommend more frequent screening and Pap tests. Colorectal Cancer  This type of cancer can be detected and often prevented.  Routine colorectal cancer screening usually begins at 34 years of age and continues through 34 years of age.  Your health care provider may recommend screening at an earlier age if you have risk factors for colon cancer.  Your health care provider may also recommend using home test kits to check for hidden blood in the stool.  A small camera at the end of a tube can be used to examine your colon directly  (sigmoidoscopy or colonoscopy). This is done to check for the earliest forms of colorectal cancer.  Routine screening usually begins at age 43.  Direct examination of the colon should be repeated every 5-10 years through 34 years of age. However, you may need to be screened more often if early forms of precancerous polyps or small growths are found. Skin Cancer  Check your skin from head to toe regularly.  Tell your health care provider about any new moles or changes in moles, especially if there is a change in a mole's shape or color.  Also tell your health care provider if you have a mole that is larger than the size of a pencil eraser.  Always use sunscreen. Apply sunscreen liberally and repeatedly throughout the day.  Protect yourself by wearing long sleeves, pants, a wide-brimmed hat, and sunglasses whenever you are outside. HEART DISEASE, DIABETES, AND HIGH BLOOD PRESSURE   High blood pressure causes heart disease and increases the risk of stroke.  High blood pressure is more likely to develop in:  People who have blood pressure in the high end of the normal range (130-139/85-89 mm Hg).  People who are overweight or obese.  People who are African American.  If you are 42-104 years of age, have your blood pressure checked every 3-5 years. If you are 60 years of age or older, have your blood pressure checked every year. You should have your blood pressure measured twice--once when you are at a hospital or clinic, and once when you are not at a hospital or clinic. Record the average of the two measurements. To check your blood pressure when you are not at a hospital or clinic, you can use:  An automated blood pressure machine at a pharmacy.  A home blood pressure monitor.  If you are between 18 years and 59 years old, ask your health care provider if you should take aspirin to prevent strokes.  Have regular diabetes screenings. This involves taking a blood sample to check your  fasting blood sugar level.  If you are at a normal weight and have a low risk for diabetes, have this test once every three years after 34 years of age.  If you are overweight and have a high risk for diabetes, consider being tested at a younger age or more often. PREVENTING INFECTION  Hepatitis B  If you have a higher risk for hepatitis B, you should be screened for this virus. You are considered at high risk for hepatitis B if:  You were born in a country where hepatitis B is common. Ask your health care provider which countries are considered high risk.  Your parents were born in a high-risk country, and you have not been immunized against hepatitis B (hepatitis B vaccine).  You have HIV or AIDS.  You use needles to inject street drugs.  You live with someone who has hepatitis B.  You have had sex with someone who has hepatitis B.  You get hemodialysis treatment.  You take certain medicines for conditions, including cancer, organ transplantation, and autoimmune conditions. Hepatitis C  Blood testing is recommended for:  Everyone born from [birth year] through 1964.  Anyone with known risk factors for hepatitis C. Sexually transmitted infections (STIs)  You should be screened for sexually transmitted infections (STIs) including gonorrhea and chlamydia if:  You are sexually active and are younger than 34 years of age.  You are older than 34 years of age and your health care provider tells you that you are at risk for this type of infection.  Your sexual activity has changed since you were last screened and you are at an increased risk for chlamydia or gonorrhea. Ask your health care provider if you are at risk.  If you do not have HIV, but are at risk, it may be recommended that you take a prescription medicine daily to prevent HIV infection. This is called pre-exposure prophylaxis (PrEP). You are considered at risk if:  You are sexually active and do not regularly use condoms or  know the HIV status of your partner(s).  You take drugs by injection.  You are sexually active with a partner who has HIV. Talk with your health care provider about whether you are at high risk of being infected with HIV. If you choose to begin PrEP, you should first be tested for HIV. You should then be tested every 3 months for as long as you are taking PrEP.  PREGNANCY   If you  are premenopausal and you may become pregnant, ask your health care provider about preconception counseling.  If you may become pregnant, take 400 to 800 micrograms (mcg) of folic acid every day.  If you want to prevent pregnancy, talk to your health care provider about birth control (contraception). OSTEOPOROSIS AND MENOPAUSE   Osteoporosis is a disease in which the bones lose minerals and strength with aging. This can result in serious bone fractures. Your risk for osteoporosis can be identified using a bone density scan.  If you are 38 years of age or older, or if you are at risk for osteoporosis and fractures, ask your health care provider if you should be screened.  Ask your health care provider whether you should take a calcium or vitamin D supplement to lower your risk for osteoporosis.  Menopause may have certain physical symptoms and risks.  Hormone replacement therapy may reduce some of these symptoms and risks. Talk to your health care provider about whether hormone replacement therapy is right for you.  HOME CARE INSTRUCTIONS   Schedule regular health, dental, and eye exams.  Stay current with your immunizations.   Do not use any tobacco products including cigarettes, chewing tobacco, or electronic cigarettes.  If you are pregnant, do not drink alcohol.  If you are breastfeeding, limit how much and how often you drink alcohol.  Limit alcohol intake to no more than 1 drink per day for nonpregnant women. One drink equals 12 ounces of beer, 5 ounces of wine, or 1 ounces of hard liquor.  Do  not use street drugs.  Do not share needles.  Ask your health care provider for help if you need support or information about quitting drugs.  Tell your health care provider if you often feel depressed.  Tell your health care provider if you have ever been abused or do not feel safe at home.   This information is not intended to replace advice given to you by your health care provider. Make sure you discuss any questions you have with your health care provider.   Document Released: 01/08/2011 Document Revised: 07/16/2014 Document Reviewed: 05/27/2013 Elsevier Interactive Patient Education [employer].

## 2015-12-03 ENCOUNTER — Encounter: Payer: Self-pay | Admitting: Family Medicine

## 2015-12-03 DIAGNOSIS — Z124 Encounter for screening for malignant neoplasm of cervix: Secondary | ICD-10-CM | POA: Insufficient documentation

## 2015-12-03 DIAGNOSIS — N889 Noninflammatory disorder of cervix uteri, unspecified: Secondary | ICD-10-CM | POA: Insufficient documentation

## 2015-12-03 DIAGNOSIS — E669 Obesity, unspecified: Secondary | ICD-10-CM | POA: Insufficient documentation

## 2015-12-03 DIAGNOSIS — Z8041 Family history of malignant neoplasm of ovary: Secondary | ICD-10-CM | POA: Insufficient documentation

## 2015-12-03 DIAGNOSIS — J302 Other seasonal allergic rhinitis: Secondary | ICD-10-CM | POA: Insufficient documentation

## 2015-12-03 NOTE — Assessment & Plan Note (Signed)
USPSTF grade A and B recommendations reviewed with patient; age-appropriate recommendations, preventive care, screening tests, etc discussed and encouraged; healthy living encouraged; see AVS for patient education given to patient  

## 2015-12-03 NOTE — Assessment & Plan Note (Signed)
Encouraged working on [employer] and diet now, prior to considering starting a family; get in training now, get body in shape

## 2015-12-03 NOTE — Assessment & Plan Note (Signed)
Thin prep collected 

## 2015-12-03 NOTE — Assessment & Plan Note (Signed)
Possible small polyp at os, excessive bright red bleeding with pap collection; refer to GYN

## 2015-12-03 NOTE — Assessment & Plan Note (Signed)
Obese; refer to ENT

## 2015-12-03 NOTE — Assessment & Plan Note (Signed)
Patient was tested for BRCA and was negative per her report

## 2015-12-03 NOTE — Assessment & Plan Note (Signed)
Discussed one-time HIV screening recommendation per USPSTF guidelines; patient agrees with testing; HIV antibody ordered 

## 2015-12-06 DIAGNOSIS — Z124 Encounter for screening for malignant neoplasm of cervix: Secondary | ICD-10-CM | POA: Diagnosis not present

## 2015-12-08 ENCOUNTER — Encounter: Payer: Self-pay | Admitting: Family Medicine

## 2015-12-09 DIAGNOSIS — Z114 Encounter for screening for human immunodeficiency virus [HIV]: Secondary | ICD-10-CM | POA: Diagnosis not present

## 2015-12-09 DIAGNOSIS — Z0000 Encounter for general adult medical examination without abnormal findings: Secondary | ICD-10-CM | POA: Diagnosis not present

## 2015-12-09 LAB — IGP, RFX APTIMA HPV ASCU: PAP Smear Comment: 0

## 2015-12-09 LAB — HIV ANTIBODY (ROUTINE TESTING W REFLEX): HIV 1&2 Ab, 4th Generation: NEGATIVE

## 2015-12-10 LAB — COMPREHENSIVE METABOLIC PANEL
A/G RATIO: 1.8 (ref 1.2–2.2)
ALBUMIN: 4.6 g/dL (ref 3.5–5.5)
ALT: 14 IU/L (ref 0–32)
AST: 15 IU/L (ref 0–40)
Alkaline Phosphatase: 69 IU/L (ref 39–117)
BILIRUBIN TOTAL: 0.6 mg/dL (ref 0.0–1.2)
BUN / CREAT RATIO: 12 (ref 9–23)
BUN: 11 mg/dL (ref 6–20)
CHLORIDE: 99 mmol/L (ref 96–106)
CO2: 22 mmol/L (ref 18–29)
Calcium: 9.5 mg/dL (ref 8.7–10.2)
Creatinine, Ser: 0.89 mg/dL (ref 0.57–1.00)
GFR calc non Af Amer: 85 mL/min/{1.73_m2} (ref >59)
GFR, EST AFRICAN AMERICAN: 98 mL/min/{1.73_m2} (ref >59)
Globulin, Total: 2.5 g/dL (ref 1.5–4.5)
Glucose: 96 mg/dL (ref 65–99)
POTASSIUM: 4.5 mmol/L (ref 3.5–5.2)
SODIUM: 137 mmol/L (ref 134–144)
Total Protein: 7.1 g/dL (ref 6.0–8.5)

## 2015-12-10 LAB — CBC WITH DIFFERENTIAL/PLATELET
Basophils Absolute: 0 10*3/uL (ref 0.0–0.2)
Basos: 1 %
EOS (ABSOLUTE): 0.2 10*3/uL (ref 0.0–0.4)
EOS: 3 %
HEMATOCRIT: 40.6 % (ref 34.0–46.6)
HEMOGLOBIN: 13.9 g/dL (ref 11.1–15.9)
IMMATURE GRANULOCYTES: 0 %
Immature Grans (Abs): 0 10*3/uL (ref 0.0–0.1)
LYMPHS: 39 %
Lymphocytes Absolute: 2.6 10*3/uL (ref 0.7–3.1)
MCH: 30.7 pg (ref 26.6–33.0)
MCHC: 34.2 g/dL (ref 31.5–35.7)
MCV: 90 fL (ref 79–97)
Monocytes Absolute: 0.5 10*3/uL (ref 0.1–0.9)
Monocytes: 8 %
NEUTROS ABS: 3.3 10*3/uL (ref 1.4–7.0)
Neutrophils: 49 %
Platelets: 296 10*3/uL (ref 150–379)
RBC: 4.53 x10E6/uL (ref 3.77–5.28)
RDW: 13.5 % (ref 12.3–15.4)
WBC: 6.6 10*3/uL (ref 3.4–10.8)

## 2015-12-10 LAB — VITAMIN D 25 HYDROXY (VIT D DEFICIENCY, FRACTURES): Vit D, 25-Hydroxy: 27.2 ng/mL — ABNORMAL LOW (ref 30.0–100.0)

## 2015-12-10 LAB — LIPID PANEL W/O CHOL/HDL RATIO
Cholesterol, Total: 209 mg/dL — ABNORMAL HIGH (ref 100–199)
HDL: 51 mg/dL (ref >39)
LDL Calculated: 143 mg/dL — ABNORMAL HIGH (ref 0–99)
Triglycerides: 73 mg/dL (ref 0–149)
VLDL Cholesterol Cal: 15 mg/dL (ref 5–40)

## 2015-12-10 LAB — HIV ANTIBODY (ROUTINE TESTING W REFLEX): HIV Screen 4th Generation wRfx: NONREACTIVE

## 2015-12-10 LAB — VITAMIN B12: VITAMIN B 12: 613 pg/mL (ref 211–946)

## 2015-12-10 LAB — TSH: TSH: 1.17 u[IU]/mL (ref 0.450–4.500)

## 2015-12-13 ENCOUNTER — Encounter: Payer: 59 | Attending: Family Medicine | Admitting: Dietician

## 2015-12-13 ENCOUNTER — Encounter: Payer: Self-pay | Admitting: Dietician

## 2015-12-13 ENCOUNTER — Telehealth: Payer: Self-pay | Admitting: Family Medicine

## 2015-12-13 VITALS — Ht 63.0 in | Wt 198.1 lb

## 2015-12-13 DIAGNOSIS — Z713 Dietary counseling and surveillance: Secondary | ICD-10-CM | POA: Insufficient documentation

## 2015-12-13 DIAGNOSIS — Z124 Encounter for screening for malignant neoplasm of cervix: Secondary | ICD-10-CM

## 2015-12-13 DIAGNOSIS — E669 Obesity, unspecified: Secondary | ICD-10-CM

## 2015-12-13 NOTE — Patient Instructions (Addendum)
Try doing grapefruit and protein shake in the morning (carb and protein).  Continue to work out 5 x week. Continue to work on eating slowly and mindfully. Pay attention to how clothes are fitting and how you are feeling exercising for motivation. Look for other "small wins/non scale victories".  Look for non food rewards such as new exercise clothes.

## 2015-12-13 NOTE — Progress Notes (Signed)
  Medical Nutrition Therapy:  Appt start time: 1710 end time:  1735  Assessment:  Primary concerns today: Oneisha is here for a follow up for weight loss. Has lost about 2 lbs in the past month. Started logging her food in a journal and it opened her eyes to the amounts and types of foods she was eating. Starting using MyFitnessPal. Has cut back on calories a lot and eating 1300-1500 calories per day. Was eating around 2300 calories. Trying to eat more mindfully. [facility] Doing weights and swimming. Has a friend who has been keeping her on track.  Overall doing well and trying to not get frustrated that the weight loss isn't faster.    Preferred Learning Style:   No preference indicated   Learning Readiness:   Ready  MEDICATIONS: see list   DIETARY INTAKE:  Usual eating pattern includes 3 meals and 2 snacks per day.  Avoided foods include: doesn't like a lot of meat, almonds   24-hr recall:  B (8 AM): V8 juice premier protein and shake or grapefruit  Snk ( 10-10:30 AM): none L ( PM): lean meat and vegetable - salad with meat sometimes with a carb (from cafeteria from [facility])  Snk ( PM): none D ( PM): red meat and vegetable  Snk ( PM): none Beverages: water, coffee with 1/2 and 1/2 cream and sometimes sugar, sparkling water, coconut water, hot tea, grapefruit juice (low calorie)   Us[facility] - [facility], weights, cardio  Estimated energy needs: 1800 calories 200 g carbohydrates 135 g protein 50 g fat  Progress Towards Goal(s):  In progress.   Nutritional Diagnosis:  [place]-3.3 Overweight/obesity As related to hx of large portion sizing and eating quickly.  As evidenced by BMI of 35.6.    Intervention:  Nutrition counseling provided. Plan: Make sure to have vegetable at meals, consider having prepped raw vegetables around. Try ranch with plain nonfat greek yogurt with [employer].  Aim to take 20 minutes to eat meals. Chew 20-30 x per bite and eat without distractions when possible. Enjoy your food! Pay attention to hunger/fullness feelings. Eat when you are hungry and stop when you have had enough. Try to journal food and hunger feelings. Look for patterns. Consider - how do you know when you are hungry?   Teaching Method Utilized:  Visual Auditory Hands on  Handouts given during visit include:  none  Barriers to learning/adherence to lifestyle change: none  Demonstrated degree of understanding via:  Teach Back   Monitoring/Evaluation:  Dietary intake, exercise, and body weight in 1 month(s).

## 2015-12-13 NOTE — Assessment & Plan Note (Signed)
Pap smear

## 2015-12-13 NOTE — Telephone Encounter (Signed)
I am so sorry Leah Brown. I hope this is the last one going forward. Please add on an HPV to the pap smear that was collected for this patient. Thank you

## 2015-12-14 NOTE — Telephone Encounter (Signed)
Added

## 2015-12-16 LAB — HPV DNA PROBE HIGH RISK, AMPLIFIED: HPV, high-risk: NEGATIVE

## 2015-12-16 LAB — SPECIMEN STATUS REPORT

## 2015-12-20 ENCOUNTER — Encounter: Payer: Self-pay | Admitting: Family Medicine

## 2015-12-20 MED ORDER — CITALOPRAM HYDROBROMIDE 10 MG PO TABS: 10.0000 mg | ORAL_TABLET | Freq: Every day | ORAL | Status: DC

## 2016-01-12 ENCOUNTER — Encounter: Payer: Self-pay | Admitting: Obstetrics and Gynecology

## 2016-01-12 ENCOUNTER — Ambulatory Visit (INDEPENDENT_AMBULATORY_CARE_PROVIDER_SITE_OTHER): Payer: 59 | Admitting: Obstetrics and Gynecology

## 2016-01-12 VITALS — BP 116/60 | HR 80 | Resp 16 | Ht 63.5 in | Wt 198.0 lb

## 2016-01-12 DIAGNOSIS — N888 Other specified noninflammatory disorders of cervix uteri: Secondary | ICD-10-CM | POA: Diagnosis not present

## 2016-01-12 DIAGNOSIS — N949 Unspecified condition associated with female genital organs and menstrual cycle: Secondary | ICD-10-CM

## 2016-01-12 DIAGNOSIS — N9489 Other specified conditions associated with female genital organs and menstrual cycle: Secondary | ICD-10-CM

## 2016-01-12 NOTE — Progress Notes (Signed)
Patient ID: Leah Brown, female   DOB: 15-May-1981, 35 y.o.   MRN: [id number] 35 y.o. [id number] SingleCaucasianF here for a consultation from Dr Sanda Klein for a possible cervical polyp. She was noted to have excessive bleeding during her recent pap (pap was normal). She is also c/o vaginal burning x 7 days, mild. No itching or increased vaginal discharge.   Period Cycle (Days): 28 Period Duration (Days): 3-5 days Period Pattern: Regular Menstrual Flow: Light, Heavy Menstrual Control: Maxi pad, Thin pad Dysmenorrhea: (!) Moderate Dysmenorrhea Symptoms: Cramping  She can saturate a pad in up to 5-6 hours. No BTB, no postcoital bleeding.   Patient's last menstrual period was 12/14/2015.          Sexually active: Yes.    The current method of family planning is none.    Exercising: Yes.    weights, cardio, and swim Smoker:  Former smoker   Health Maintenance: Pap:  12-06-15 WNL NEG HR HPV History of abnormal Pap:  no MMG:  N/A Colonoscopy:  N/A BMD:   N/A TDaP:  04-22-13 Gardasil: completed all 3    reports that she quit smoking about 12 years ago. Her smoking use included Cigarettes. She started smoking about 16 years ago. She has a 2 pack-year smoking history. She has never used smokeless tobacco. She reports that she drinks about 1.2 oz of alcohol per week. She reports that she does not use illicit drugs.  Past Medical History  Diagnosis Date  . Anxiety   . Depression   . Multiple food allergies   . Obesity   . Skin cancer   . Ophthalmic migraine 2006  . Amenorrhea 2006    Ongoing since 2006  . Snores 12/02/2015  . Infertility, female     Past Surgical History  Procedure Laterality Date  . Skin cancer destruction Left 2008    Breast    Current Outpatient Prescriptions  Medication Sig Dispense Refill  . aspirin 81 MG tablet Take 81 mg by mouth daily.    . caffeine 200 MG TABS tablet Take 200 mg by mouth 2 (two) times daily.    . cetirizine (ZYRTEC) 10 MG tablet Take 10 mg by  mouth daily.     . citalopram (CELEXA) 10 MG tablet Take 1 tablet (10 mg total) by mouth daily. 30 tablet 6  . Garlic 123XX123 MG TABS Take 1 tablet by mouth daily.     . Magnesium 100 MG CAPS Take by mouth.    . MELATONIN PO Take 25 mg by mouth daily.    . Multiple Vitamin (MULTIVITAMIN WITH MINERALS) TABS tablet Take 1 tablet by mouth daily.    Marland Kitchen VITAMIN D, ERGOCALCIFEROL, PO Take 1 tablet by mouth daily.      No current facility-administered medications for this visit.    Family History  Problem Relation Age of Onset  . Hypertension Father   . Diabetes Father   . Obesity Father   . Appendicitis Mother 36  . Ovarian cancer Maternal Aunt   . Ovarian cancer Maternal Grandmother     Review of Systems  Constitutional: Negative.   HENT: Negative.   Eyes: Negative.   Respiratory: Negative.   Cardiovascular: Negative.   Gastrointestinal: Negative.   Endocrine: Negative.   Genitourinary:       Vaginal burning   Musculoskeletal: Negative.   Skin: Negative.   Allergic/Immunologic: Negative.   Neurological: Negative.   Psychiatric/Behavioral: Negative.     Exam:   BP 116/60 mmHg  Pulse 80  Resp 16  Ht 5' 3.5" (1.613 m)  Wt 198 lb (89.812 kg)  BMI 34.52 kg/m2  LMP 12/14/2015  Weight change: @WEIGHTCHANGE @ Height:   Height: 5' 3.5" (161.3 cm)  Ht Readings from Last 3 Encounters:  01/12/16 5' 3.5" (1.613 m)  12/13/15 5\' 3"  (1.6 m)  12/02/15 5\' 3"  (1.6 m)    General appearance: alert, cooperative and appears stated age   Pelvic: External genitalia:  no lesions              Urethra:  normal appearing urethra with no masses, tenderness or lesions              Bartholins and Skenes: normal                 Vagina: normal appearing vagina with normal color, slight increase in white creamy vaginal discharge, no lesions              Cervix: no lesions, slight ectropion, used a cytobrush to try and tease any polyp out from the endocervix, no polyp seen. No excessive bleeding today.                Bimanual Exam:  Uterus:  normal size, contour, position, consistency, mobility, non-tender              Adnexa: no mass, fullness, tenderness                Wet prep: no clue, no trich, ++ wbc KOH: no yeast PH: 4   Chaperone was present for exam.  A:  Friable cervix on recent exam, currently WNL, no polyp seen  Vaginal burning, ++WBC on vaginal slides, no clear infection  P:   Reassured  Wet prep probe sent, treatment depending on results    CC: Dr Enid Derry Letter sent

## 2016-01-13 ENCOUNTER — Other Ambulatory Visit: Payer: Self-pay | Admitting: *Deleted

## 2016-01-13 LAB — WET PREP BY MOLECULAR PROBE
Candida species: NEGATIVE
Gardnerella vaginalis: POSITIVE — AB
TRICHOMONAS VAG: NEGATIVE

## 2016-01-13 MED ORDER — METRONIDAZOLE 500 MG PO TABS: 500.0000 mg | ORAL_TABLET | Freq: Two times a day (BID) | ORAL | Status: DC

## 2016-01-13 MED FILL — metroNIDAZOLE 500 MG TABS: 500 | 7 days supply | Qty: 14 | Fill #0

## 2016-01-19 ENCOUNTER — Encounter: Payer: Self-pay | Admitting: Family Medicine

## 2016-01-20 ENCOUNTER — Telehealth: Payer: Self-pay | Admitting: Family Medicine

## 2016-01-20 NOTE — Telephone Encounter (Signed)
Patient has not heard anything back about the sleep study specialist referral that was put in on May 26th; please follow-up on that and contact her Monday

## 2016-01-23 NOTE — Telephone Encounter (Signed)
Please follow-up on the referral and contact the patient; she has not heard anything; thank you

## 2016-01-23 NOTE — Telephone Encounter (Signed)
According to documentation it was sent to [facility] on 5/30 by Roselyn Reef

## 2016-01-30 MED FILL — CITALOPRAM HBR 10 MG TABLET: 10 | 30 days supply | Qty: 30 | Fill #0

## 2016-02-20 DIAGNOSIS — G4710 Hypersomnia, unspecified: Secondary | ICD-10-CM | POA: Diagnosis not present

## 2016-02-20 DIAGNOSIS — J301 Allergic rhinitis due to pollen: Secondary | ICD-10-CM | POA: Diagnosis not present

## 2016-03-04 ENCOUNTER — Ambulatory Visit: Payer: 59 | Attending: Otolaryngology

## 2016-03-04 DIAGNOSIS — R0683 Snoring: Secondary | ICD-10-CM | POA: Insufficient documentation

## 2016-03-04 DIAGNOSIS — G4730 Sleep apnea, unspecified: Secondary | ICD-10-CM | POA: Diagnosis not present

## 2016-03-04 DIAGNOSIS — E669 Obesity, unspecified: Secondary | ICD-10-CM | POA: Diagnosis not present

## 2016-03-04 DIAGNOSIS — G4700 Insomnia, unspecified: Secondary | ICD-10-CM | POA: Diagnosis present

## 2016-03-04 DIAGNOSIS — F5101 Primary insomnia: Secondary | ICD-10-CM | POA: Insufficient documentation

## 2016-03-04 DIAGNOSIS — F39 Unspecified mood [affective] disorder: Secondary | ICD-10-CM | POA: Insufficient documentation

## 2016-03-08 MED FILL — CITALOPRAM HBR 10 MG TABLET: 10 | 30 days supply | Qty: 30 | Fill #1

## 2016-03-13 ENCOUNTER — Encounter: Payer: 59 | Admitting: Obstetrics and Gynecology

## 2016-03-15 NOTE — Telephone Encounter (Signed)
Please see note.

## 2016-03-16 NOTE — Telephone Encounter (Signed)
Yes, this is an old note; I started the note on July 14th; thank you for giving some closure

## 2016-03-16 NOTE — Telephone Encounter (Signed)
Confused is this an old note? She has sleep study 03/04/16

## 2016-04-12 MED FILL — CITALOPRAM HBR 10 MG TABLET: 10 | 30 days supply | Qty: 30 | Fill #2

## 2016-05-14 MED FILL — CITALOPRAM HBR 10 MG TABLET: 10 | 30 days supply | Qty: 30 | Fill #3

## 2016-06-05 MED FILL — CITALOPRAM HBR 10 MG TABLET: 10 | 30 days supply | Qty: 30 | Fill #4

## 2016-06-25 ENCOUNTER — Encounter: Payer: Self-pay | Admitting: Physician Assistant

## 2016-06-25 ENCOUNTER — Ambulatory Visit: Payer: Self-pay | Admitting: Physician Assistant

## 2016-06-25 VITALS — BP 120/74 | HR 80 | Temp 98.9°F

## 2016-06-25 DIAGNOSIS — J069 Acute upper respiratory infection, unspecified: Secondary | ICD-10-CM

## 2016-06-25 NOTE — Progress Notes (Signed)
S: C/o runny nose and congestion for 2 days, no fever, chills, cp/sob, v/d; mucus was bloody this am but clear throughout the day, cough is sporadic, dry, traveled to [place] and it was dryer there than here, ? If bloody mucus bc she's dried out or because she's sick  Using otc meds: robitussin  O: PE: vitals wnl, nad, perrl eomi, normocephalic, tms dull, nasal mucosa red and swollen, throat injected, neck supple no lymph, lungs c t a, cv rrr, neuro intact  A:  Acute viral uri   P: drink fluids, continue regular meds , use otc meds of choice, return if not improving in 5 days, return earlier if worsening , saline nasal spray, neosporin to inside of nose, if worsening will call in an antibotic

## 2016-07-11 DIAGNOSIS — H52223 Regular astigmatism, bilateral: Secondary | ICD-10-CM | POA: Diagnosis not present

## 2016-07-13 MED FILL — CITALOPRAM HBR 10 MG TABLET: 10 | 30 days supply | Qty: 30 | Fill #5

## 2016-08-13 ENCOUNTER — Other Ambulatory Visit: Payer: Self-pay | Admitting: Family Medicine

## 2016-08-13 MED FILL — CITALOPRAM HBR 10 MG TABLET: 10 | 30 days supply | Qty: 30 | Fill #0

## 2016-09-11 MED FILL — CITALOPRAM HBR 10 MG TABLET: 10 | 30 days supply | Qty: 30 | Fill #1

## 2016-09-19 ENCOUNTER — Telehealth: Payer: 59 | Admitting: Family

## 2016-09-19 DIAGNOSIS — R399 Unspecified symptoms and signs involving the genitourinary system: Secondary | ICD-10-CM | POA: Diagnosis not present

## 2016-09-19 MED ORDER — NITROFURANTOIN MONOHYD MACRO 100 MG PO CAPS: 100.0000 mg | ORAL_CAPSULE | Freq: Two times a day (BID) | ORAL | 0 refills | Status: DC

## 2016-09-19 MED FILL — NITROFURANTOIN MONO-MCR 100: 100 | 7 days supply | Qty: 14 | Fill #0

## 2016-09-19 NOTE — Progress Notes (Signed)

## 2016-10-15 MED FILL — CITALOPRAM HBR 10 MG TABLET: 10 | 30 days supply | Qty: 30 | Fill #2

## 2016-11-20 MED FILL — CITALOPRAM HBR 10 MG TABLET: 10 | 30 days supply | Qty: 30 | Fill #3

## 2016-12-06 ENCOUNTER — Encounter: Payer: 59 | Admitting: Family Medicine

## 2016-12-13 ENCOUNTER — Ambulatory Visit (INDEPENDENT_AMBULATORY_CARE_PROVIDER_SITE_OTHER): Payer: 59 | Admitting: Family Medicine

## 2016-12-13 ENCOUNTER — Encounter: Payer: Self-pay | Admitting: Family Medicine

## 2016-12-13 VITALS — BP 104/56 | HR 83 | Temp 98.5°F | Resp 16 | Ht 63.5 in | Wt 193.6 lb

## 2016-12-13 DIAGNOSIS — Z0000 Encounter for general adult medical examination without abnormal findings: Secondary | ICD-10-CM

## 2016-12-13 DIAGNOSIS — Z1231 Encounter for screening mammogram for malignant neoplasm of breast: Secondary | ICD-10-CM | POA: Diagnosis not present

## 2016-12-13 DIAGNOSIS — Z1239 Encounter for other screening for malignant neoplasm of breast: Secondary | ICD-10-CM

## 2016-12-13 DIAGNOSIS — E559 Vitamin D deficiency, unspecified: Secondary | ICD-10-CM | POA: Insufficient documentation

## 2016-12-13 DIAGNOSIS — Z8041 Family history of malignant neoplasm of ovary: Secondary | ICD-10-CM

## 2016-12-13 DIAGNOSIS — D485 Neoplasm of uncertain behavior of skin: Secondary | ICD-10-CM

## 2016-12-13 LAB — CBC WITH DIFFERENTIAL/PLATELET
Basophils Absolute: 80 cells/uL (ref 0–200)
Basophils Relative: 1 %
EOS ABS: 80 {cells}/uL (ref 15–500)
Eosinophils Relative: 1 %
HCT: 41.3 % (ref 35.0–45.0)
Hemoglobin: 13.6 g/dL (ref 11.7–15.5)
LYMPHS PCT: 34 %
Lymphs Abs: 2720 cells/uL (ref 850–3900)
MCH: 30.6 pg (ref 27.0–33.0)
MCHC: 32.9 g/dL (ref 32.0–36.0)
MCV: 93 fL (ref 80.0–100.0)
MONO ABS: 560 {cells}/uL (ref 200–950)
MONOS PCT: 7 %
MPV: 9.8 fL (ref 7.5–12.5)
Neutro Abs: 4560 cells/uL (ref 1500–7800)
Neutrophils Relative %: 57 %
Platelets: 282 10*3/uL (ref 140–400)
RBC: 4.44 MIL/uL (ref 3.80–5.10)
RDW: 13.4 % (ref 11.0–15.0)
WBC: 8 10*3/uL (ref 3.8–10.8)

## 2016-12-13 LAB — COMPLETE METABOLIC PANEL WITH GFR
ALT: 15 U/L (ref 6–29)
AST: 15 U/L (ref 10–30)
Albumin: 4.1 g/dL (ref 3.6–5.1)
Alkaline Phosphatase: 58 U/L (ref 33–115)
BILIRUBIN TOTAL: 0.6 mg/dL (ref 0.2–1.2)
BUN: 13 mg/dL (ref 7–25)
CO2: 25 mmol/L (ref 20–31)
CREATININE: 0.77 mg/dL (ref 0.50–1.10)
Calcium: 9.2 mg/dL (ref 8.6–10.2)
Chloride: 104 mmol/L (ref 98–110)
GFR, Est African American: >89 mL/min (ref >=60)
GFR, Est Non African American: >89 mL/min (ref >=60)
GLUCOSE: 90 mg/dL (ref 65–99)
Potassium: 4.3 mmol/L (ref 3.5–5.3)
SODIUM: 138 mmol/L (ref 135–146)
TOTAL PROTEIN: 6.7 g/dL (ref 6.1–8.1)

## 2016-12-13 LAB — LIPID PANEL
CHOL/HDL RATIO: 3.5 ratio (ref <5.0)
Cholesterol: 188 mg/dL (ref <200)
HDL: 53 mg/dL (ref >50)
LDL Cholesterol: 122 mg/dL — ABNORMAL HIGH (ref <100)
Triglycerides: 64 mg/dL (ref <150)
VLDL: 13 mg/dL (ref <30)

## 2016-12-13 MED ORDER — CITALOPRAM HYDROBROMIDE 10 MG PO TABS: 5.0000 mg | ORAL_TABLET | Freq: Every day | ORAL | 0 refills | Status: DC

## 2016-12-13 NOTE — Assessment & Plan Note (Signed)
Check level, supplement as indicated

## 2016-12-13 NOTE — Progress Notes (Signed)
Patient ID: Leah Brown, female   DOB: [birth year], 36 y.o.   MRN: [id number]   Subjective:   Leah Brown is a 36 y.o. female here for a complete physical exam  Interim issues since last visit: saw gyn for possible polyp; none seen by GYN; no abnormal paps  USPSTF grade A and B recommendations Depression:  Depression screen [facility] 2/9 12/13/2016 12/13/2015 12/02/2015 11/08/2015 10/06/2015  Decreased Interest 0 0 0 0 0  Down, Depressed, Hopeless 0 0 0 0 0  PHQ - 2 Score 0 0 0 0 0   Hypertension: BP Readings from Last 3 Encounters:  12/13/16 (!) 104/56  06/25/16 120/74  01/12/16 116/60   Obesity:  Wt Readings from Last 3 Encounters:  12/13/16 193 lb 9.6 oz (87.8 kg)  01/12/16 198 lb (89.8 kg)  12/13/15 198 lb 1.6 oz (89.9 kg)   BMI Readings from Last 3 Encounters:  12/13/16 33.76 kg/m  01/12/16 34.52 kg/m  12/13/15 35.09 kg/m    Alcohol: social Tobacco use: quit 2005 HIV, hep B, hep C: not intersted STD testing and prevention (chl/gon/syphilis): not interested Intimate partner violence: no abuse Breast cancer: no lumps BRCA gene screening: already tested, negative Cervical cancer screening: 12/06/2015 negative, HPV negative Osteoporosis: n/a Fall prevention/vitamin D: taking vit D if not out in the sun Lipids:  Lab Results  Component Value Date   CHOL 209 (H) 12/09/2015   CHOL 175 06/11/2013   Lab Results  Component Value Date   HDL 51 12/09/2015   HDL 42 06/11/2013   Lab Results  Component Value Date   LDLCALC 143 (H) 12/09/2015   LDLCALC 123 (H) 06/11/2013   Lab Results  Component Value Date   TRIG 73 12/09/2015   TRIG 52 06/11/2013   No results found for: CHOLHDL No results found for: LDLDIRECT Glucose:  Glucose  Date Value Ref Range Status  12/09/2015 96 65 - 99 mg/dL Final  06/11/2013 96 65 - 99 mg/dL Final   Colorectal cancer: no first degree relatives with colon cancer Lung cancer:  n/a AAA: n/a Aspirin: n/a Diet: will work to improve diet   Exercise: will try to increase activity slowly and gradually Skin cancer: one mole on right scalp and right hip  Interested in decreasing SSRI  Past Medical History:  Diagnosis Date  . Amenorrhea 2006   Ongoing since 2006  . Anxiety   . Depression   . Infertility, female   . Multiple food allergies   . Obesity   . Ophthalmic migraine 2006  . Skin cancer   . Snores 12/02/2015   Past Surgical History:  Procedure Laterality Date  . SKIN CANCER DESTRUCTION Left 2008   Breast   Family History  Problem Relation Age of Onset  . Hypertension Father   . Diabetes Father   . Obesity Father   . Appendicitis Mother 57  . Ovarian cancer Maternal Aunt   . Gallstones Maternal Aunt   . Ovarian cancer Maternal Grandmother    Social History  Substance Use Topics  . Smoking status: Former Smoker    Packs/day: 0.50    Years: 4.00    Types: Cigarettes    Start date: 07/10/1999    Quit date: 07/10/2003  . Smokeless tobacco: Never Used  . Alcohol use 1.2 oz/week    1 Glasses of wine, 1 Cans of beer per week     Comment: socially   Review of Systems  Objective:   Vitals:   12/13/16 0819  BP: (!) 104/56  Pulse: 83  Resp: 16  Temp: 98.5 F (36.9 C)  TempSrc: Oral  SpO2: 98%  Weight: 193 lb 9.6 oz (87.8 kg)  Height: 5' 3.5" (1.613 m)   Body mass index is 33.76 kg/m. Wt Readings from Last 3 Encounters:  12/13/16 193 lb 9.6 oz (87.8 kg)  01/12/16 198 lb (89.8 kg)  12/13/15 198 lb 1.6 oz (89.9 kg)   Physical Exam  Constitutional: She appears well-developed and well-nourished.  HENT:  Head: Normocephalic and atraumatic.  Right Ear: Hearing, tympanic membrane, external ear and ear canal normal.  Left Ear: Hearing, tympanic membrane, external ear and ear canal normal.  Eyes: Conjunctivae and EOM are normal. Right eye exhibits no hordeolum. Left eye exhibits no hordeolum. No scleral icterus.  Neck: Carotid bruit is not present. No thyromegaly present.  Cardiovascular: Normal  rate, regular rhythm, S1 normal, S2 normal and normal heart sounds.   No extrasystoles are present.  Pulmonary/Chest: Effort normal and breath sounds normal. No respiratory distress. Right breast exhibits no inverted nipple, no mass, no nipple discharge, no skin change and no tenderness. Left breast exhibits no inverted nipple, no mass, no nipple discharge, no skin change and no tenderness. Breasts are symmetrical.  Abdominal: Soft. Normal appearance and bowel sounds are normal. She exhibits no distension, no abdominal bruit, no pulsatile midline mass and no mass. There is no hepatosplenomegaly. There is no tenderness. No hernia.  Musculoskeletal: Normal range of motion. She exhibits no edema.  Lymphadenopathy:       Head (right side): No submandibular adenopathy present.       Head (left side): No submandibular adenopathy present.    She has no cervical adenopathy.    She has no axillary adenopathy.  Neurological: She is alert. She displays no tremor. No cranial nerve deficit. She exhibits normal muscle tone. Gait normal.  Reflex Scores:      Patellar reflexes are 2+ on the right side and 2+ on the left side. Skin: Skin is warm and dry. No bruising and no ecchymosis noted. No cyanosis. No pallor.  Oval slightly papular lesion, light brown with very dark brown center; RIGHT flank 3.5 x 2 mm  Psychiatric: Her speech is normal and behavior is normal. Thought content normal. Her mood appears not anxious. She does not exhibit a depressed mood.    Assessment/Plan:   Problem List Items Addressed This Visit      Other   Vitamin D deficiency    Check level, supplement as indicated      Relevant Orders   VITAMIN D 25 Hydroxy (Vit-D Deficiency, Fractures)   Preventative health care - Primary    USPSTF grade A and B recommendations reviewed with patient; age-appropriate recommendations, preventive care, screening tests, etc discussed and encouraged; healthy living encouraged; see AVS for patient  education given to patient      Relevant Orders   COMPLETE METABOLIC PANEL WITH GFR   CBC with Differential/Platelet   Lipid panel   TSH   Family hx of ovarian malignancy    BRCA negative       Other Visit Diagnoses    Neoplasm of uncertain behavior of skin       Relevant Orders   Ambulatory referral to Dermatology   Screening for breast cancer       Relevant Orders   MM Digital Screening       Meds ordered this encounter  Medications  . Cholecalciferol (VITAMIN D) 2000 units CAPS  Sig: Take 1 capsule by mouth daily.  . citalopram (CELEXA) 10 MG tablet    Sig: Take 0.5 tablets (5 mg total) by mouth daily. For 4 weeks, then stop    Dispense:  15 tablet    Refill:  0   Orders Placed This Encounter  Procedures  . MM Digital Screening    Standing Status:   Future    Standing Expiration Date:   02/12/2018    Order Specific Question:   Reason for Exam (SYMPTOM  OR DIAGNOSIS REQUIRED)    Answer:   yearly screening for breast cancer    Order Specific Question:   Is the patient pregnant?    Answer:   No    Order Specific Question:   Preferred imaging location?    Answer:   [facility]  . VITAMIN D 25 Hydroxy (Vit-D Deficiency, Fractures)  . COMPLETE METABOLIC PANEL WITH GFR  . CBC with Differential/Platelet  . Lipid panel  . TSH  . Ambulatory referral to Dermatology    Referral Priority:   Routine    Referral Type:   Consultation    Referral Reason:   Specialty Services Required    Requested Specialty:   Dermatology    Number of Visits Requested:   1    Follow up plan: Return in about 1 year (around 12/13/2017) for complete physical.  An After Visit Summary was printed and given to the patient.

## 2016-12-13 NOTE — Assessment & Plan Note (Signed)
USPSTF grade A and B recommendations reviewed with patient; age-appropriate recommendations, preventive care, screening tests, etc discussed and encouraged; healthy living encouraged; see AVS for patient education given to patient  

## 2016-12-13 NOTE — Patient Instructions (Addendum)
We'll get labs Decrease citalopram to 5 mg daily for 4 weeks then stop Call right away if darkened mood Try to limit saturated fats in your diet (bologna, hot dogs, barbeque, cheeseburgers, hamburgers, steak, bacon, sausage, cheese, etc.) and get more fresh fruits, vegetables, and whole grains Health Maintenance, Female Adopting a healthy lifestyle and getting preventive care can go a long way to promote health and wellness. Talk with your health care provider about what schedule of regular examinations is right for you. This is a good chance for you to check in with your provider about disease prevention and staying healthy. In between checkups, there are plenty of things you can do on your own. Experts have done a lot of research about which lifestyle changes and preventive measures are most likely to keep you healthy. Ask your health care provider for more information. Weight and diet Eat a healthy diet  Be sure to include plenty of vegetables, fruits, low-fat dairy products, and lean protein.  Do not eat a lot of foods high in solid fats, added sugars, or salt.  Get regular exercise. This is one of the most important things you can do for your health. ? Most adults should exercise for at least 150 minutes each week. The exercise should increase your heart rate and make you sweat (moderate-intensity exercise). ? Most adults should also do strengthening exercises at least twice a week. This is in addition to the moderate-intensity exercise.  Maintain a healthy weight  Body mass index (BMI) is a measurement that can be used to identify possible weight problems. It estimates body fat based on height and weight. Your health care provider can help determine your BMI and help you achieve or maintain a healthy weight.  For females 48 years of age and older: ? A BMI below 18.5 is considered underweight. ? A BMI of 18.5 to 24.9 is normal. ? A BMI of 25 to 29.9 is considered overweight. ? A BMI of  30 and above is considered obese.  Watch levels of cholesterol and blood lipids  You should start having your blood tested for lipids and cholesterol at 35 years of age, then have this test every 5 years.  You may need to have your cholesterol levels checked more often if: ? Your lipid or cholesterol levels are high. ? You are older than 35 years of age. ? You are at high risk for heart disease.  Cancer screening Lung Cancer  Lung cancer screening is recommended for adults 24-89 years old who are at high risk for lung cancer because of a history of smoking.  A yearly low-dose CT scan of the lungs is recommended for people who: ? Currently smoke. ? Have quit within the past 15 years. ? Have at least a 30-pack-year history of smoking. A pack year is smoking an average of one pack of cigarettes a day for 1 year.  Yearly screening should continue until it has been 15 years since you quit.  Yearly screening should stop if you develop a health problem that would prevent you from having lung cancer treatment.  Breast Cancer  Practice breast self-awareness. This means understanding how your breasts normally appear and feel.  It also means doing regular breast self-exams. Let your health care provider know about any changes, no matter how small.  If you are in your 20s or 30s, you should have a clinical breast exam (CBE) by a health care provider every 1-3 years as part of a regular health  exam.  If you are 40 or older, have a CBE every year. Also consider having a breast X-ray (mammogram) every year.  If you have a family history of breast cancer, talk to your health care provider about genetic screening.  If you are at high risk for breast cancer, talk to your health care provider about having an MRI and a mammogram every year.  Breast cancer gene (BRCA) assessment is recommended for women who have family members with BRCA-related cancers. BRCA-related cancers  include: ? Breast. ? Ovarian. ? Tubal. ? Peritoneal cancers.  Results of the assessment will determine the need for genetic counseling and BRCA1 and BRCA2 testing.  Cervical Cancer Your health care provider may recommend that you be screened regularly for cancer of the pelvic organs (ovaries, uterus, and vagina). This screening involves a pelvic examination, including checking for microscopic changes to the surface of your cervix (Pap test). You may be encouraged to have this screening done every 3 years, beginning at age 55.  For women ages 59-65, health care providers may recommend pelvic exams and Pap testing every 3 years, or they may recommend the Pap and pelvic exam, combined with testing for human papilloma virus (HPV), every 5 years. Some types of HPV increase your risk of cervical cancer. Testing for HPV may also be done on women of any age with unclear Pap test results.  Other health care providers may not recommend any screening for nonpregnant women who are considered low risk for pelvic cancer and who do not have symptoms. Ask your health care provider if a screening pelvic exam is right for you.  If you have had past treatment for cervical cancer or a condition that could lead to cancer, you need Pap tests and screening for cancer for at least 20 years after your treatment. If Pap tests have been discontinued, your risk factors (such as having a new sexual partner) need to be reassessed to determine if screening should resume. Some women have medical problems that increase the chance of getting cervical cancer. In these cases, your health care provider may recommend more frequent screening and Pap tests.  Colorectal Cancer  This type of cancer can be detected and often prevented.  Routine colorectal cancer screening usually begins at 35 years of age and continues through 35 years of age.  Your health care provider may recommend screening at an earlier age if you have risk factors  for colon cancer.  Your health care provider may also recommend using home test kits to check for hidden blood in the stool.  A small camera at the end of a tube can be used to examine your colon directly (sigmoidoscopy or colonoscopy). This is done to check for the earliest forms of colorectal cancer.  Routine screening usually begins at age 75.  Direct examination of the colon should be repeated every 5-10 years through 35 years of age. However, you may need to be screened more often if early forms of precancerous polyps or small growths are found.  Skin Cancer  Check your skin from head to toe regularly.  Tell your health care provider about any new moles or changes in moles, especially if there is a change in a mole's shape or color.  Also tell your health care provider if you have a mole that is larger than the size of a pencil eraser.  Always use sunscreen. Apply sunscreen liberally and repeatedly throughout the day.  Protect yourself by wearing long sleeves, pants, a wide-brimmed  hat, and sunglasses whenever you are outside.  Heart disease, diabetes, and high blood pressure  High blood pressure causes heart disease and increases the risk of stroke. High blood pressure is more likely to develop in: ? People who have blood pressure in the high end of the normal range (130-139/85-89 mm Hg). ? People who are overweight or obese. ? People who are African American.  If you are 77-97 years of age, have your blood pressure checked every 3-5 years. If you are 30 years of age or older, have your blood pressure checked every year. You should have your blood pressure measured twice-once when you are at a hospital or clinic, and once when you are not at a hospital or clinic. Record the average of the two measurements. To check your blood pressure when you are not at a hospital or clinic, you can use: ? An automated blood pressure machine at a pharmacy. ? A home blood pressure monitor.  If  you are between 7 years and 48 years old, ask your health care provider if you should take aspirin to prevent strokes.  Have regular diabetes screenings. This involves taking a blood sample to check your fasting blood sugar level. ? If you are at a normal weight and have a low risk for diabetes, have this test once every three years after 35 years of age. ? If you are overweight and have a high risk for diabetes, consider being tested at a younger age or more often. Preventing infection Hepatitis B  If you have a higher risk for hepatitis B, you should be screened for this virus. You are considered at high risk for hepatitis B if: ? You were born in a country where hepatitis B is common. Ask your health care provider which countries are considered high risk. ? Your parents were born in a high-risk country, and you have not been immunized against hepatitis B (hepatitis B vaccine). ? You have HIV or AIDS. ? You use needles to inject street drugs. ? You live with someone who has hepatitis B. ? You have had sex with someone who has hepatitis B. ? You get hemodialysis treatment. ? You take certain medicines for conditions, including cancer, organ transplantation, and autoimmune conditions.  Hepatitis C  Blood testing is recommended for: ? Everyone born from [birth year] through 1964. ? Anyone with known risk factors for hepatitis C.  Sexually transmitted infections (STIs)  You should be screened for sexually transmitted infections (STIs) including gonorrhea and chlamydia if: ? You are sexually active and are younger than 35 years of age. ? You are older than 35 years of age and your health care provider tells you that you are at risk for this type of infection. ? Your sexual activity has changed since you were last screened and you are at an increased risk for chlamydia or gonorrhea. Ask your health care provider if you are at risk.  If you do not have HIV, but are at risk, it may be recommended  that you take a prescription medicine daily to prevent HIV infection. This is called pre-exposure prophylaxis (PrEP). You are considered at risk if: ? You are sexually active and do not regularly use condoms or know the HIV status of your partner(s). ? You take drugs by injection. ? You are sexually active with a partner who has HIV.  Talk with your health care provider about whether you are at high risk of being infected with HIV. If you choose to begin  PrEP, you should first be tested for HIV. You should then be tested every 3 months for as long as you are taking PrEP. Pregnancy  If you are premenopausal and you may become pregnant, ask your health care provider about preconception counseling.  If you may become pregnant, take 400 to 800 micrograms (mcg) of folic acid every day.  If you want to prevent pregnancy, talk to your health care provider about birth control (contraception). Osteoporosis and menopause  Osteoporosis is a disease in which the bones lose minerals and strength with aging. This can result in serious bone fractures. Your risk for osteoporosis can be identified using a bone density scan.  If you are 22 years of age or older, or if you are at risk for osteoporosis and fractures, ask your health care provider if you should be screened.  Ask your health care provider whether you should take a calcium or vitamin D supplement to lower your risk for osteoporosis.  Menopause may have certain physical symptoms and risks.  Hormone replacement therapy may reduce some of these symptoms and risks. Talk to your health care provider about whether hormone replacement therapy is right for you. Follow these instructions at home:  Schedule regular health, dental, and eye exams.  Stay current with your immunizations.  Do not use any tobacco products including cigarettes, chewing tobacco, or electronic cigarettes.  If you are pregnant, do not drink alcohol.  If you are  breastfeeding, limit how much and how often you drink alcohol.  Limit alcohol intake to no more than 1 drink per day for nonpregnant women. One drink equals 12 ounces of beer, 5 ounces of wine, or 1 ounces of hard liquor.  Do not use street drugs.  Do not share needles.  Ask your health care provider for help if you need support or information about quitting drugs.  Tell your health care provider if you often feel depressed.  Tell your health care provider if you have ever been abused or do not feel safe at home. This information is not intended to replace advice given to you by your health care provider. Make sure you discuss any questions you have with your health care provider. Document Released: 01/08/2011 Document Revised: 12/01/2015 Document Reviewed: 03/29/2015 Elsevier Interactive Patient Education  [employer].

## 2016-12-13 NOTE — Assessment & Plan Note (Signed)
BRCA negative

## 2016-12-14 LAB — TSH: TSH: 1.83 m[IU]/L

## 2016-12-14 LAB — VITAMIN D 25 HYDROXY (VIT D DEFICIENCY, FRACTURES): VIT D 25 HYDROXY: 28 ng/mL — AB (ref 30–100)

## 2016-12-20 ENCOUNTER — Other Ambulatory Visit: Payer: Self-pay | Admitting: Family Medicine

## 2016-12-20 NOTE — Telephone Encounter (Signed)
She was supposed to taper off of medicine and stop

## 2016-12-21 MED FILL — CITALOPRAM HBR 10 MG TABLET: 10 | 30 days supply | Qty: 15 | Fill #0

## 2017-04-19 ENCOUNTER — Ambulatory Visit
Admission: RE | Admit: 2017-04-19 | Discharge: 2017-04-19 | Disposition: A | Discharge status: Home / Self Care | Payer: 59 | Source: Ambulatory Visit | Attending: Family Medicine | Admitting: Family Medicine

## 2017-04-19 DIAGNOSIS — Z1239 Encounter for other screening for malignant neoplasm of breast: Secondary | ICD-10-CM

## 2017-04-19 DIAGNOSIS — Z1231 Encounter for screening mammogram for malignant neoplasm of breast: Secondary | ICD-10-CM | POA: Diagnosis not present

## 2017-06-24 ENCOUNTER — Telehealth: Payer: 59 | Admitting: Family

## 2017-06-24 DIAGNOSIS — R12 Heartburn: Secondary | ICD-10-CM

## 2017-06-24 NOTE — Progress Notes (Signed)
Thank you for the details you included in the comment boxes. Those details are very helpful in determining the best course of treatment for you and help [place] to provide the best care. If this is constant for 3 days without any variation or relief, we need to examine you in person.  Based on what you shared with me it looks like you have a serious condition that should be evaluated in a face to face office visit.  NOTE: Even if you have entered your credit card information for this eVisit, you will not be charged.   If you are having a true medical emergency please call 911.  If you need an urgent face to face visit, [facility] has four urgent care centers for your convenience.  If you need care fast and have a high deductible or no insurance consider:   [email or web address]  [phone]  [address] [place], [place] [address] 8 am to 8 pm Monday-Friday 10 am to 4 pm Saturday-Sunday   The following sites will take your  insurance:    . [facility] [place] a Provider at this Location  [address] [place], [place] [address] . 10 am to 8 pm Monday-Friday . 12 pm to 8 pm Saturday-Sunday   . [facility] [place] a Provider at this Location  [address] [place], [place], [place] [address] . 8 am to 8 pm Monday-Friday . 9 am to 6 pm Saturday . 11 am to 6 pm Sunday   . [facility] at [place] Get Driving Directions  [address] [place],  [address] . 8 am to 8 pm Monday-Friday . 8 am to 4 pm Saturday-Sunday   Your e-visit answers were reviewed by a board certified advanced clinical practitioner to complete your personal care plan.  Thank you for using e-Visits.

## 2017-06-25 ENCOUNTER — Ambulatory Visit (INDEPENDENT_AMBULATORY_CARE_PROVIDER_SITE_OTHER): Payer: 59 | Admitting: Family Medicine

## 2017-06-25 ENCOUNTER — Encounter: Payer: Self-pay | Admitting: Family Medicine

## 2017-06-25 VITALS — BP 120/74 | HR 100 | Temp 98.8°F | Ht 63.0 in | Wt 198.4 lb

## 2017-06-25 DIAGNOSIS — R1013 Epigastric pain: Secondary | ICD-10-CM | POA: Diagnosis not present

## 2017-06-25 MED ORDER — SUCRALFATE 1 GM/10ML PO SUSP: 1.0000 g | Freq: Three times a day (TID) | ORAL | 0 refills | Status: DC

## 2017-06-25 MED ORDER — OMEPRAZOLE 20 MG PO CPDR: 20.0000 mg | DELAYED_RELEASE_CAPSULE | Freq: Every day | ORAL | 0 refills | Status: DC

## 2017-06-25 MED FILL — OMEPRAZOLE 20 MG CAP: 20 | 60 days supply | Qty: 60 | Fill #0

## 2017-06-25 MED FILL — CARAFATE 1 GM/10 ML SUSP: 1 | 10 days supply | Qty: 420 | Fill #0

## 2017-06-25 NOTE — Progress Notes (Signed)
BP 120/74 (BP Location: Right Arm, Patient Position: Sitting, Cuff Size: Large)   Pulse 100   Temp 98.8 F (37.1 C) (Oral)   Ht 5\' 3"  (1.6 m)   Wt 198 lb 6.4 oz (90 kg)   LMP 05/31/2017   SpO2 99%   BMI 35.14 kg/m    Subjective:    Patient ID: Leah Brown, female    DOB: 1981-01-15, 36 y.o.   MRN: [id number]  HPI: Leah Brown is a 36 y.o. female  Chief Complaint  Patient presents with  . Heartburn    Pt states that she has had ,omior issues with this in th past nothing this bad, came on 3 days ago has tred tums and tummy zen     HPI Patient is here c/o heartburn No major increase in stress Trying tummy zen and tums, not really helping No certain trigger No blood in stool Some caffeine, coffee, 2-3 cups a day Not many sodas or tea Caffeine pill for energy Rare NSAID No hx of H pylori; not known in the family Abdominal aching and burning belly button up base of throat is raw No actual acid or regurgitation No vomiting, but does feel a bit nauseated Not losing weight  Depression screen [facility] 2/9 06/25/2017 12/13/2016 12/13/2015 12/02/2015 11/08/2015  Decreased Interest 0 0 0 0 0  Down, Depressed, Hopeless 0 0 0 0 0  PHQ - 2 Score 0 0 0 0 0    Relevant past medical, surgical, family and social history reviewed Past Medical History:  Diagnosis Date  . Amenorrhea 2006   Ongoing since 2006  . Anxiety   . Depression   . Infertility, female   . Multiple food allergies   . Obesity   . Ophthalmic migraine 2006  . Skin cancer   . Snores 12/02/2015   Past Surgical History:  Procedure Laterality Date  . SKIN CANCER DESTRUCTION Left 2008   Breast   Family History  Problem Relation Age of Onset  . Hypertension Father   . Diabetes Father   . Obesity Father   . Appendicitis Mother 46  . Ovarian cancer Maternal Aunt   . Gallstones Maternal Aunt   . Ovarian cancer Maternal Grandmother   . Breast cancer Neg Hx    Social History   Tobacco Use  . Smoking status:  Former Smoker    Packs/day: 0.50    Years: 4.00    Pack years: 2.00    Types: Cigarettes    Start date: 07/10/1999    Last attempt to quit: 07/10/2003    Years since quitting: 13.9  . Smokeless tobacco: Never Used  Substance Use Topics  . Alcohol use: Yes    Alcohol/week: 1.2 oz    Types: 1 Glasses of wine, 1 Cans of beer per week    Comment: socially  . Drug use: No   Interim medical history since last visit reviewed. Allergies and medications reviewed  Review of Systems Per HPI unless specifically indicated above     Objective:    BP 120/74 (BP Location: Right Arm, Patient Position: Sitting, Cuff Size: Large)   Pulse 100   Temp 98.8 F (37.1 C) (Oral)   Ht 5\' 3"  (1.6 m)   Wt 198 lb 6.4 oz (90 kg)   LMP 05/31/2017   SpO2 99%   BMI 35.14 kg/m   Wt Readings from Last 3 Encounters:  06/25/17 198 lb 6.4 oz (90 kg)  12/13/16 193 lb 9.6 oz (87.8  kg)  01/12/16 198 lb (89.8 kg)    Physical Exam  Constitutional: She appears well-developed and well-nourished. No distress.  HENT:  Mouth/Throat: Mucous membranes are normal.  Eyes: EOM are normal. No scleral icterus.  Cardiovascular: Normal rate and regular rhythm.  Pulmonary/Chest: Effort normal and breath sounds normal.  Abdominal: She exhibits no distension and no mass. There is tenderness (mild epigastric). There is no guarding.  Neurological: She is alert.  Skin: No pallor.  Psychiatric: She has a normal mood and affect. Her behavior is normal.      Assessment & Plan:   Problem List Items Addressed This Visit    None    Visit Diagnoses    Acute epigastric pain    -  Primary   today is day 4; ddx includes gastritis, duodenitis, H pylori infection; start with labs, carafate, PPI   Relevant Orders   CBC with Differential/Platelet (Completed)   H. pylori breath test   Lipase   Amylase   COMPLETE METABOLIC PANEL WITH GFR       Follow up plan: No Follow-up on file.  An after-visit summary was printed and given  to the patient at [place].  Please see the patient instructions which may contain other information and recommendations beyond what is mentioned above in the assessment and plan.  Meds ordered this encounter  Medications  . sucralfate (CARAFATE) 1 GM/10ML suspension    Sig: Take 10 mLs (1 g total) by mouth 4 (four) times daily -  with meals and at bedtime.    Dispense:  420 mL    Refill:  0  . omeprazole (PRILOSEC) 20 MG capsule    Sig: Take 1 capsule (20 mg total) by mouth daily.    Dispense:  60 capsule    Refill:  0    Orders Placed This Encounter  Procedures  . CBC with Differential/Platelet  . H. pylori breath test  . Lipase  . Amylase  . COMPLETE METABOLIC PANEL WITH GFR

## 2017-06-25 NOTE — Patient Instructions (Addendum)
Start the new medicines Try to limit or avoid triggers like coffee, caffeinated beverages, onions, chocolate, spicy foods, peppermint, acidic foods like pizza, spaghetti sauce, and orange juice Lose weight if you are overweight or obese Try elevating the head of your bed by placing a small wedge between your mattress and box springs to keep acid in the stomach at night instead of coming up into your esophagus We'll get labs If you have not heard anything from my staff in a week about any orders/referrals/studies from today, please contact us here to follow-up [phone] Please do seek medical treatment right away for any serious symptoms like bleeding, vomiting blood, serious pain, etc. Call me with an update in 2 weeks

## 2017-06-26 ENCOUNTER — Encounter: Payer: Self-pay | Admitting: Family Medicine

## 2017-06-26 LAB — COMPLETE METABOLIC PANEL WITH GFR
AG RATIO: 1.5 (calc) (ref 1.0–2.5)
ALBUMIN MSPROF: 4.1 g/dL (ref 3.6–5.1)
ALT: 11 U/L (ref 6–29)
AST: 15 U/L (ref 10–30)
Alkaline phosphatase (APISO): 59 U/L (ref 33–115)
BILIRUBIN TOTAL: 0.5 mg/dL (ref 0.2–1.2)
BUN: 10 mg/dL (ref 7–25)
CHLORIDE: 103 mmol/L (ref 98–110)
CO2: 27 mmol/L (ref 20–32)
Calcium: 9.4 mg/dL (ref 8.6–10.2)
Creat: 0.72 mg/dL (ref 0.50–1.10)
GFR, EST AFRICAN AMERICAN: 126 mL/min/{1.73_m2} (ref >=60)
GFR, Est Non African American: 108 mL/min/{1.73_m2} (ref >=60)
GLOBULIN: 2.8 g/dL (ref 1.9–3.7)
Glucose, Bld: 96 mg/dL (ref 65–99)
POTASSIUM: 4.3 mmol/L (ref 3.5–5.3)
SODIUM: 137 mmol/L (ref 135–146)
TOTAL PROTEIN: 6.9 g/dL (ref 6.1–8.1)

## 2017-06-26 LAB — CBC WITH DIFFERENTIAL/PLATELET
BASOS ABS: 59 {cells}/uL (ref 0–200)
Basophils Relative: 0.7 %
EOS PCT: 1.8 %
Eosinophils Absolute: 151 cells/uL (ref 15–500)
HCT: 40.6 % (ref 35.0–45.0)
Hemoglobin: 13.7 g/dL (ref 11.7–15.5)
Lymphs Abs: 3427 cells/uL (ref 850–3900)
MCH: 30.4 pg (ref 27.0–33.0)
MCHC: 33.7 g/dL (ref 32.0–36.0)
MCV: 90 fL (ref 80.0–100.0)
MONOS PCT: 6.6 %
MPV: 10.5 fL (ref 7.5–12.5)
NEUTROS PCT: 50.1 %
Neutro Abs: 4208 cells/uL (ref 1500–7800)
PLATELETS: 259 10*3/uL (ref 140–400)
RBC: 4.51 10*6/uL (ref 3.80–5.10)
RDW: 12.2 % (ref 11.0–15.0)
Total Lymphocyte: 40.8 %
WBC mixed population: 554 cells/uL (ref 200–950)
WBC: 8.4 10*3/uL (ref 3.8–10.8)

## 2017-06-26 LAB — AMYLASE: AMYLASE: 28 U/L (ref 21–101)

## 2017-06-26 LAB — LIPASE: Lipase: 18 U/L (ref 7–60)

## 2017-06-26 LAB — H. PYLORI BREATH TEST: H. pylori Breath Test: NOT DETECTED

## 2017-07-10 ENCOUNTER — Encounter: Payer: Self-pay | Admitting: Family Medicine

## 2017-07-18 ENCOUNTER — Ambulatory Visit (INDEPENDENT_AMBULATORY_CARE_PROVIDER_SITE_OTHER): Payer: No Typology Code available for payment source | Admitting: Family Medicine

## 2017-07-18 ENCOUNTER — Encounter: Payer: Self-pay | Admitting: Family Medicine

## 2017-07-18 VITALS — BP 118/64 | HR 75 | Temp 98.4°F | Resp 14 | Wt 197.0 lb

## 2017-07-18 DIAGNOSIS — Z026 Encounter for examination for insurance purposes: Secondary | ICD-10-CM | POA: Diagnosis not present

## 2017-07-18 DIAGNOSIS — J302 Other seasonal allergic rhinitis: Secondary | ICD-10-CM | POA: Diagnosis not present

## 2017-07-18 DIAGNOSIS — K30 Functional dyspepsia: Secondary | ICD-10-CM | POA: Diagnosis not present

## 2017-07-18 MED ORDER — OMEPRAZOLE 20 MG PO CPDR: 20.0000 mg | DELAYED_RELEASE_CAPSULE | Freq: Every day | ORAL | 3 refills | Status: DC

## 2017-07-18 NOTE — Patient Instructions (Signed)
Caution: prolonged use of proton pump inhibitors like omeprazole (Prilosec), pantoprazole (Protonix), esomeprazole (Nexium), and others like Dexilant and Aciphex may increase your risk of pneumonia, Clostridium difficile colitis, osteoporosis, anemia and other health complications Try to limit or avoid triggers like coffee, caffeinated beverages, onions, chocolate, spicy foods, peppermint, acidic foods like pizza, spaghetti sauce, and orange juice Lose weight if you are overweight or obese Try elevating the head of your bed by placing a small wedge between your mattress and box springs to keep acid in the stomach at night instead of coming up into your esophagus

## 2017-07-18 NOTE — Assessment & Plan Note (Signed)
Taking cetirizine PRN

## 2017-07-18 NOTE — Assessment & Plan Note (Signed)
Avoid triggers; continue PPI for short term

## 2017-07-18 NOTE — Progress Notes (Signed)
BP 118/64   Pulse 75   Temp 98.4 F (36.9 C) (Oral)   Resp 14   Wt 197 lb (89.4 kg)   LMP 07/07/2017   SpO2 99%   BMI 34.90 kg/m    Subjective:    Patient ID: Leah Brown, female    DOB: March 01, 1981, 37 y.o.   MRN: [id number]  HPI: Leah Brown is a 37 y.o. female  Chief Complaint  Patient presents with  . Paperwork    life ins.    HPI Patient is here for an appt to go over life insurance paperwork Signing up for life insurance, new plan  She had some issues with epigastric discomfort and was seen in December; she wrote a note to say that her stomach was no longer bothering her; she was taking the medicine and being mindful of her diet and it was working Labs were done in December which showed normal CMP, CBC, lipase, amylase, and H pylori breath test; no blood in stool; medicine is working well and she'd like refills  Hx of some anxiety, work-related prior to June 2016; took medication; never had to see a psychiatrist or psychologist; changed jobs and the anxiety went away  Depression screen [facility] 2/9 07/18/2017 06/25/2017 12/13/2016 12/13/2015 12/02/2015  Decreased Interest 0 0 0 0 0  Down, Depressed, Hopeless 0 0 0 0 0  PHQ - 2 Score 0 0 0 0 0    Relevant past medical, surgical, family and social history reviewed Past Medical History:  Diagnosis Date  . Amenorrhea 2006   Ongoing since 2006  . Anxiety   . Depression   . Infertility, female   . Multiple food allergies   . Obesity   . Ophthalmic migraine 2006  . Skin cancer   . Snores 12/02/2015   Past Surgical History:  Procedure Laterality Date  . SKIN CANCER DESTRUCTION Left 2008   Breast   Family History  Problem Relation Age of Onset  . Hypertension Father   . Diabetes Father   . Obesity Father   . Appendicitis Mother 68  . Ovarian cancer Maternal Aunt   . Gallstones Maternal Aunt   . Ovarian cancer Maternal Grandmother   . Breast cancer Neg Hx    Social History   Tobacco Use  . Smoking  status: Former Smoker    Packs/day: 0.50    Years: 4.00    Pack years: 2.00    Types: Cigarettes    Start date: 07/10/1999    Last attempt to quit: 07/10/2003    Years since quitting: 14.0  . Smokeless tobacco: Never Used  Substance Use Topics  . Alcohol use: Yes    Alcohol/week: 1.2 oz    Types: 1 Glasses of wine, 1 Cans of beer per week    Comment: socially  . Drug use: No    Interim medical history since last visit reviewed. Allergies and medications reviewed  Review of Systems Per HPI unless specifically indicated above     Objective:    BP 118/64   Pulse 75   Temp 98.4 F (36.9 C) (Oral)   Resp 14   Wt 197 lb (89.4 kg)   LMP 07/07/2017   SpO2 99%   BMI 34.90 kg/m   Wt Readings from Last 3 Encounters:  07/18/17 197 lb (89.4 kg)  06/25/17 198 lb 6.4 oz (90 kg)  12/13/16 193 lb 9.6 oz (87.8 kg)    Physical Exam  Constitutional: She appears well-developed and well-nourished.  HENT:  Mouth/Throat: Mucous membranes are normal.  Eyes: EOM are normal. No scleral icterus.  Cardiovascular: Normal rate and regular rhythm.  Pulmonary/Chest: Effort normal and breath sounds normal.  Abdominal: Soft. She exhibits no distension. There is no tenderness.  Neurological: She is alert.  Psychiatric: She has a normal mood and affect. Her behavior is normal.    Results for orders placed or performed in visit on 06/25/17  CBC with Differential/Platelet  Result Value Ref Range   WBC 8.4 3.8 - 10.8 Thousand/uL   RBC 4.51 3.80 - 5.10 Million/uL   Hemoglobin 13.7 11.7 - 15.5 g/dL   HCT 40.6 35.0 - 45.0 %   MCV 90.0 80.0 - 100.0 fL   MCH 30.4 27.0 - 33.0 pg   MCHC 33.7 32.0 - 36.0 g/dL   RDW 12.2 11.0 - 15.0 %   Platelets 259 140 - 400 Thousand/uL   MPV 10.5 7.5 - 12.5 fL   Neutro Abs 4,208 1,500 - 7,800 cells/uL   Lymphs Abs 3,427 850 - 3,900 cells/uL   WBC mixed population 554 200 - 950 cells/uL   Eosinophils Absolute 151 15 - 500 cells/uL   Basophils Absolute 59 0 - 200  cells/uL   Neutrophils Relative % 50.1 %   Total Lymphocyte 40.8 %   Monocytes Relative 6.6 %   Eosinophils Relative 1.8 %   Basophils Relative 0.7 %  H. pylori breath test  Result Value Ref Range   H. pylori Breath Test NOT DETECTED NOT DETECT  Lipase  Result Value Ref Range   Lipase 18 7 - 60 U/L  Amylase  Result Value Ref Range   Amylase 28 21 - 101 U/L  COMPLETE METABOLIC PANEL WITH GFR  Result Value Ref Range   Glucose, Bld 96 65 - 99 mg/dL   BUN 10 7 - 25 mg/dL   Creat 0.72 0.50 - 1.10 mg/dL   GFR, Est Non African American 108 > OR = 60 mL/min/1.83m2   GFR, Est African American 126 > OR = 60 mL/min/1.33m2   BUN/Creatinine Ratio NOT APPLICABLE 6 - 22 (calc)   Sodium 137 135 - 146 mmol/L   Potassium 4.3 3.5 - 5.3 mmol/L   Chloride 103 98 - 110 mmol/L   CO2 27 20 - 32 mmol/L   Calcium 9.4 8.6 - 10.2 mg/dL   Total Protein 6.9 6.1 - 8.1 g/dL   Albumin 4.1 3.6 - 5.1 g/dL   Globulin 2.8 1.9 - 3.7 g/dL (calc)   AG Ratio 1.5 1.0 - 2.5 (calc)   Total Bilirubin 0.5 0.2 - 1.2 mg/dL   Alkaline phosphatase (APISO) 59 33 - 115 U/L   AST 15 10 - 30 U/L   ALT 11 6 - 29 U/L      Assessment & Plan:   Problem List Items Addressed This Visit      Respiratory   Other seasonal allergic rhinitis    Taking cetirizine PRN        Other   Acid indigestion - Primary    Avoid triggers; continue PPI for short term       Other Visit Diagnoses    Encounter for insurance examination       see form, completed with patient in its entirety       Follow up plan: Return if symptoms worsen or fail to improve.  An after-visit summary was printed and given to the patient at [facility].  Please see the patient instructions which may contain other information and  recommendations beyond what is mentioned above in the assessment and plan.  Meds ordered this encounter  Medications  . omeprazole (PRILOSEC) 20 MG capsule    Sig: Take 1 capsule (20 mg total) by mouth daily.    Dispense:  30  capsule    Refill:  3    No orders of the defined types were placed in this encounter.

## 2017-07-30 ENCOUNTER — Encounter: Payer: Self-pay | Admitting: Family Medicine

## 2017-08-09 ENCOUNTER — Ambulatory Visit (INDEPENDENT_AMBULATORY_CARE_PROVIDER_SITE_OTHER): Payer: No Typology Code available for payment source | Admitting: Family Medicine

## 2017-08-09 ENCOUNTER — Encounter: Payer: Self-pay | Admitting: Family Medicine

## 2017-08-09 VITALS — BP 118/74 | HR 88 | Temp 98.6°F | Resp 14 | Ht 63.88 in | Wt 197.0 lb

## 2017-08-09 DIAGNOSIS — D485 Neoplasm of uncertain behavior of skin: Secondary | ICD-10-CM

## 2017-08-09 DIAGNOSIS — N760 Acute vaginitis: Secondary | ICD-10-CM | POA: Diagnosis not present

## 2017-08-09 DIAGNOSIS — Z01419 Encounter for gynecological examination (general) (routine) without abnormal findings: Secondary | ICD-10-CM

## 2017-08-09 DIAGNOSIS — B9689 Other specified bacterial agents as the cause of diseases classified elsewhere: Secondary | ICD-10-CM

## 2017-08-09 NOTE — Progress Notes (Signed)
BP 118/74   Pulse 88   Temp 98.6 F (37 C) (Oral)   Resp 14   Ht 5' 3.88" (1.623 m)   Wt 197 lb (89.4 kg)   SpO2 96%   BMI 33.95 kg/m    Subjective:    Patient ID: Leah Brown, female    DOB: March 14, 1981, 37 y.o.   MRN: [id number]  HPI: Leah Brown is a 37 y.o. female  Chief Complaint  Patient presents with  . Paper work    HPI Patient has other paperwork to do; see copy She needs clarification on a few things She had a possible polyp and was seen by GYN at [facility] Reviewed note today by Dr. Sumner Boast; pelvic exam reviewed in detail; her notes clearly say "no polyp seen" on the exam and in the assessment  The second issue is dermatologic She is wanting to know her current status and treatment pertaining to a derm referral In June, 2018, she had a mole noted on the right flank with a very dark center and referral was entered; she does not recall getting an appointment notification from any office; I do not have any documentation that the patient was noncompliant; she simply doesn't recall getting an appt; note from June reviewed, 3.5 x 2 mm, right flank  Depression screen [facility] 2/9 07/18/2017 06/25/2017 12/13/2016 12/13/2015 12/02/2015  Decreased Interest 0 0 0 0 0  Down, Depressed, Hopeless 0 0 0 0 0  PHQ - 2 Score 0 0 0 0 0    Relevant past medical, surgical, family and social history reviewed Past Medical History:  Diagnosis Date  . Amenorrhea 2006   Ongoing since 2006  . Anxiety   . Depression   . Infertility, female   . Multiple food allergies   . Obesity   . Ophthalmic migraine 2006  . Skin cancer   . Snores 12/02/2015   Past Surgical History:  Procedure Laterality Date  . SKIN CANCER DESTRUCTION Left 2008   Breast   Family History  Problem Relation Age of Onset  . Hypertension Father   . Diabetes Father   . Obesity Father   . Appendicitis Mother 27  . Ovarian cancer Maternal Aunt   . Gallstones Maternal Aunt   . Ovarian cancer  Maternal Grandmother   . Breast cancer Neg Hx    Social History   Tobacco Use  . Smoking status: Former Smoker    Packs/day: 0.50    Years: 4.00    Pack years: 2.00    Types: Cigarettes    Start date: 07/10/1999    Last attempt to quit: 07/10/2003    Years since quitting: 14.0  . Smokeless tobacco: Never Used  Substance Use Topics  . Alcohol use: Yes    Alcohol/week: 1.2 oz    Types: 1 Glasses of wine, 1 Cans of beer per week    Comment: socially  . Drug use: No   Interim medical history since last visit reviewed. Allergies and medications reviewed  Review of Systems Per HPI unless specifically indicated above     Objective:    BP 118/74   Pulse 88   Temp 98.6 F (37 C) (Oral)   Resp 14   Ht 5' 3.88" (1.623 m)   Wt 197 lb (89.4 kg)   SpO2 96%   BMI 33.95 kg/m   Wt Readings from Last 3 Encounters:  08/09/17 197 lb (89.4 kg)  07/18/17 197 lb (89.4 kg)  06/25/17  198 lb 6.4 oz (90 kg)    Physical Exam  Constitutional: She appears well-developed and well-nourished. No distress.  Cardiovascular: Normal rate.  Pulmonary/Chest: Effort normal.  Skin: No pallor.  3.5 x 2 mm oval slightly papular lesion with dark center on the RIGHT flank  Psychiatric: She has a normal mood and affect.      Assessment & Plan:   Problem List Items Addressed This Visit    None    Visit Diagnoses    Neoplasm of uncertain behavior of skin of back    -  Primary   will re-refer to derm; lesion may be benign, but worth having a dermatologist look at this   Relevant Orders   Ambulatory referral to Dermatology   Gynecologic exam normal       patient was seen by Dr. Talbert Nan in 2017; there was no polyp found on the exam; see her note   Bacterial vaginosis       hx of, resolved; noted on wet prep from GYN in 2017; currently asymptomatic       Follow up plan: No Follow-up on file.  An after-visit summary was printed and given to the patient at [facility].  Please see the patient  instructions which may contain other information and recommendations beyond what is mentioned above in the assessment and plan.  No orders of the defined types were placed in this encounter.   Orders Placed This Encounter  Procedures  . Ambulatory referral to Dermatology   Face-to-face time with patient was more than 15 minutes, >50% time spent counseling and coordination of care

## 2017-08-09 NOTE — Patient Instructions (Signed)
Someone should call you about a dermatology appointment soon If you have not heard anything from my staff in a week about any orders/referrals/studies from today, please contact us here to follow-up [phone]

## 2017-10-10 ENCOUNTER — Encounter: Payer: Self-pay | Admitting: Family Medicine

## 2017-10-10 DIAGNOSIS — E6609 Other obesity due to excess calories: Secondary | ICD-10-CM

## 2017-10-10 DIAGNOSIS — Z6836 Body mass index (BMI) 36.0-36.9, adult: Secondary | ICD-10-CM

## 2017-10-17 ENCOUNTER — Encounter (INDEPENDENT_AMBULATORY_CARE_PROVIDER_SITE_OTHER): Payer: Self-pay

## 2017-10-31 ENCOUNTER — Ambulatory Visit (INDEPENDENT_AMBULATORY_CARE_PROVIDER_SITE_OTHER): Payer: 59 | Admitting: Family Medicine

## 2017-10-31 ENCOUNTER — Encounter (INDEPENDENT_AMBULATORY_CARE_PROVIDER_SITE_OTHER): Payer: Self-pay | Admitting: Family Medicine

## 2017-10-31 VITALS — BP 103/70 | HR 81 | Temp 98.3°F | Ht 63.0 in | Wt 205.0 lb

## 2017-10-31 DIAGNOSIS — Z0289 Encounter for other administrative examinations: Secondary | ICD-10-CM

## 2017-10-31 DIAGNOSIS — Z1331 Encounter for screening for depression: Secondary | ICD-10-CM | POA: Diagnosis not present

## 2017-10-31 DIAGNOSIS — R0602 Shortness of breath: Secondary | ICD-10-CM | POA: Diagnosis not present

## 2017-10-31 DIAGNOSIS — R5383 Other fatigue: Secondary | ICD-10-CM

## 2017-10-31 DIAGNOSIS — Z9189 Other specified personal risk factors, not elsewhere classified: Secondary | ICD-10-CM

## 2017-10-31 DIAGNOSIS — E559 Vitamin D deficiency, unspecified: Secondary | ICD-10-CM

## 2017-10-31 DIAGNOSIS — Z6836 Body mass index (BMI) 36.0-36.9, adult: Secondary | ICD-10-CM | POA: Diagnosis not present

## 2017-11-01 LAB — HEMOGLOBIN A1C
Est. average glucose Bld gHb Est-mCnc: 103 mg/dL
HEMOGLOBIN A1C: 5.2 % (ref 4.8–5.6)

## 2017-11-01 LAB — COMPREHENSIVE METABOLIC PANEL
ALT: 20 IU/L (ref 0–32)
AST: 20 IU/L (ref 0–40)
Albumin/Globulin Ratio: 1.5 (ref 1.2–2.2)
Albumin: 4.3 g/dL (ref 3.5–5.5)
Alkaline Phosphatase: 73 IU/L (ref 39–117)
BUN/Creatinine Ratio: 13 (ref 9–23)
BUN: 11 mg/dL (ref 6–20)
Bilirubin Total: 0.3 mg/dL (ref 0.0–1.2)
CALCIUM: 9.4 mg/dL (ref 8.7–10.2)
CO2: 24 mmol/L (ref 20–29)
CREATININE: 0.86 mg/dL (ref 0.57–1.00)
Chloride: 102 mmol/L (ref 96–106)
GFR calc Af Amer: 101 mL/min/{1.73_m2} (ref >59)
GFR, EST NON AFRICAN AMERICAN: 87 mL/min/{1.73_m2} (ref >59)
GLUCOSE: 96 mg/dL (ref 65–99)
Globulin, Total: 2.9 g/dL (ref 1.5–4.5)
Potassium: 4.3 mmol/L (ref 3.5–5.2)
Sodium: 140 mmol/L (ref 134–144)
Total Protein: 7.2 g/dL (ref 6.0–8.5)

## 2017-11-01 LAB — CBC WITH DIFFERENTIAL
BASOS: 1 %
Basophils Absolute: 0.1 10*3/uL (ref 0.0–0.2)
EOS (ABSOLUTE): 0.2 10*3/uL (ref 0.0–0.4)
Eos: 2 %
HEMOGLOBIN: 13.8 g/dL (ref 11.1–15.9)
Hematocrit: 42.2 % (ref 34.0–46.6)
IMMATURE GRANS (ABS): 0 10*3/uL (ref 0.0–0.1)
Immature Granulocytes: 0 %
LYMPHS: 40 %
Lymphocytes Absolute: 3.5 10*3/uL — ABNORMAL HIGH (ref 0.7–3.1)
MCH: 30.1 pg (ref 26.6–33.0)
MCHC: 32.7 g/dL (ref 31.5–35.7)
MCV: 92 fL (ref 79–97)
MONOCYTES: 7 %
Monocytes Absolute: 0.6 10*3/uL (ref 0.1–0.9)
NEUTROS ABS: 4.4 10*3/uL (ref 1.4–7.0)
Neutrophils: 50 %
RBC: 4.58 x10E6/uL (ref 3.77–5.28)
RDW: 13.7 % (ref 12.3–15.4)
WBC: 8.8 10*3/uL (ref 3.4–10.8)

## 2017-11-01 LAB — LIPID PANEL WITH LDL/HDL RATIO
CHOLESTEROL TOTAL: 205 mg/dL — AB (ref 100–199)
HDL: 53 mg/dL (ref >39)
LDL CALC: 132 mg/dL — AB (ref 0–99)
LDl/HDL Ratio: 2.5 ratio (ref 0.0–3.2)
TRIGLYCERIDES: 99 mg/dL (ref 0–149)
VLDL CHOLESTEROL CAL: 20 mg/dL (ref 5–40)

## 2017-11-01 LAB — VITAMIN B12: VITAMIN B 12: 777 pg/mL (ref 232–1245)

## 2017-11-01 LAB — VITAMIN D 25 HYDROXY (VIT D DEFICIENCY, FRACTURES): Vit D, 25-Hydroxy: 28.3 ng/mL — ABNORMAL LOW (ref 30.0–100.0)

## 2017-11-01 LAB — FOLATE: FOLATE: 11.5 ng/mL (ref >3.0)

## 2017-11-01 LAB — T4, FREE: Free T4: 1.09 ng/dL (ref 0.82–1.77)

## 2017-11-01 LAB — T3: T3, Total: 124 ng/dL (ref 71–180)

## 2017-11-01 LAB — INSULIN, RANDOM: INSULIN: 20.2 u[IU]/mL (ref 2.6–24.9)

## 2017-11-01 LAB — TSH: TSH: 1.73 u[IU]/mL (ref 0.450–4.500)

## 2017-11-04 NOTE — Progress Notes (Signed)
Office: [phone]  /  Fax: [phone]   Dear Dr. Sanda Klein,   Thank you for referring Leah Brown to our clinic. The following note includes my evaluation and treatment recommendations.  HPI:   Chief Complaint: OBESITY    Leah Brown has been referred by Arnetha Courser, MD for consultation regarding her obesity and obesity related comorbidities.    Leah Brown (MR# [id number]) is a 37 y.o. female who presents on 11/04/2017 for obesity evaluation and treatment. Current BMI is Body mass index is 36.31 kg/m.Marland Kitchen Leah Brown has been struggling with her weight for many years and has been unsuccessful in either losing weight, maintaining weight loss, or reaching her healthy weight goal.     Karlin attended our information session and states she is currently in the action stage of change and ready to dedicate time achieving and maintaining a healthier weight. Leah Brown is interested in becoming our Leah Brown and working on intensive lifestyle modifications including (but not limited to) diet, exercise and weight loss.    Leah Brown states her family eats meals together she thinks her family will eat healthier with  her she struggles with family and or coworkers weight loss sabotage her desired weight loss is 63 she started gaining weight in 2006 her heaviest weight ever was 207 lbs. she has significant food cravings issues  she skips meals frequently she is frequently drinking liquids with calories she frequently makes poor food choices she has problems with excessive hunger  she frequently eats larger portions than normal  she has binge eating behaviors she struggles with emotional eating    Leah Brown feels her energy is lower than it should be. This has worsened with weight gain and has not worsened recently. Leah Brown admits to daytime somnolence and admits to waking up still tired. Leah Brown is at risk for obstructive sleep apnea. Patent has a history of symptoms of daytime Leah,  morning Leah and morning headache. Leah Brown generally gets 3 to 5 hours of sleep per night, and states they generally have restless sleep. Snoring is present. Apneic episodes are not present. Epworth Sleepiness Score is 7  Dyspnea on exertion Lennie notes increasing shortness of breath with exercising and seems to be worsening over time with weight gain. She notes getting out of breath sooner with activity than she used to. This has not gotten worse recently. Leah Brown denies orthopnea.  Vitamin D deficiency Leah Brown has a diagnosis of vitamin D deficiency. She is currently taking OTC vit D and denies nausea, vomiting or muscle weakness.  At risk for cardiovascular disease Leah Brown is at a higher than average risk for cardiovascular disease due to obesity. She currently denies any chest pain.  Depression Screen Leah Brown's Food and Mood (modified PHQ-9) score was  Depression screen PHQ 2/9 10/31/2017  Decreased Interest 3  Down, Depressed, Hopeless 3  PHQ - 2 Score 6  Altered sleeping 3  Tired, decreased energy 3  Change in appetite 1  Feeling bad or failure about yourself  2  Trouble concentrating 1  Moving slowly or fidgety/restless 1  Suicidal thoughts 0  PHQ-9 Score 17    ALLERGIES: Allergies  Allergen Reactions  . Almond (Diagnostic) Hives and Swelling  . Celery Oil Swelling  . Codeine Nausea Only  . Peanut-Containing Drug Products Other (See Comments)    Allergy testing   . Pollen Extract Swelling  . Tree Extract Hives and Swelling    MEDICATIONS: Current Outpatient Medications on File Prior to Visit  Medication Sig Dispense  Refill  . caffeine 200 MG TABS tablet Take 0.5 tablets (100 mg total) by mouth daily as needed.    . cetirizine (ZYRTEC) 10 MG tablet Take 10 mg by mouth as needed.     . Cholecalciferol (VITAMIN D) 2000 units CAPS Take 1 capsule by mouth daily.    . Multiple Vitamin (MULTIVITAMIN WITH MINERALS) TABS tablet Take 1 tablet by mouth daily.    Marland Kitchen omeprazole  (PRILOSEC) 20 MG capsule Take 1 capsule (20 mg total) by mouth daily. (Leah Brown not taking: Reported on 08/09/2017) 30 capsule 3   No current facility-administered medications on file prior to visit.     PAST MEDICAL HISTORY: Past Medical History:  Diagnosis Date  . Alcohol abuse   . Amenorrhea 2006   Ongoing since 2006  . Anxiety   . Depression   . Food allergy   . GERD (gastroesophageal reflux disease)   . High cholesterol   . Hypertension   . Infertility, female   . Joint pain   . Multiple food allergies   . Obesity   . Ophthalmic migraine 2006  . Skin cancer   . Snores 12/02/2015  . Vitamin D deficiency     PAST SURGICAL HISTORY: Past Surgical History:  Procedure Laterality Date  . SKIN CANCER DESTRUCTION Left 2008   Breast  . WISDOM TOOTH EXTRACTION      SOCIAL HISTORY: Social History   Tobacco Use  . Smoking status: Former Smoker    Packs/day: 0.50    Years: 4.00    Pack years: 2.00    Types: Cigarettes    Start date: 07/10/1999    Last attempt to quit: 07/10/2003    Years since quitting: 14.3  . Smokeless tobacco: Never Used  Substance Use Topics  . Alcohol use: Yes    Alcohol/week: 1.2 oz    Types: 1 Glasses of wine, 1 Cans of beer per week    Comment: socially  . Drug use: No    FAMILY HISTORY: Family History  Problem Relation Age of Onset  . Hypertension Father   . Diabetes Father   . Obesity Father   . Hyperlipidemia Father   . Appendicitis Mother 89  . Ovarian cancer Maternal Aunt   . Gallstones Maternal Aunt   . Ovarian cancer Maternal Grandmother   . Breast cancer Neg Hx     ROS: Review of Systems  Constitutional: Positive for malaise/Leah.  Eyes:       Wear Glasses or Contacts  Respiratory: Positive for shortness of breath (on exertion).   Cardiovascular: Negative for chest pain and orthopnea.  Gastrointestinal: Negative for nausea and vomiting.  Musculoskeletal:       Negative for muscle weakness  Skin: Positive for itching.    Neurological: Positive for headaches.  Psychiatric/Behavioral: The Leah Brown has insomnia.        Stress    PHYSICAL EXAM: Blood pressure 103/70, pulse 81, temperature 98.3 F (36.8 C), height 5\' 3"  (1.6 m), weight 205 lb (93 kg), SpO2 98 %. Body mass index is 36.31 kg/m. Physical Exam  Constitutional: She is oriented to person, place, and time. She appears well-developed and well-nourished.  HENT:  Head: Normocephalic and atraumatic.  Nose: Nose normal.  Eyes: EOM are normal. No scleral icterus.  Neck: Normal range of motion. Neck supple. No thyromegaly present.  Cardiovascular: Normal rate and regular rhythm.  Pulmonary/Chest: Effort normal. No respiratory distress.  Abdominal: Soft. There is no tenderness.  + obesity  Musculoskeletal: Normal range of  motion.  Range of Motion normal in all 4 extremities  Neurological: She is alert and oriented to person, place, and time. Coordination normal.  Skin: Skin is warm and dry.  Psychiatric: She has a normal mood and affect. Her behavior is normal.  Vitals reviewed.   RECENT LABS AND TESTS: BMET    Component Value Date/Time   NA 140 10/31/2017 0919   NA 138 06/11/2013 1014   K 4.3 10/31/2017 0919   K 3.8 06/11/2013 1014   CL 102 10/31/2017 0919   CL 102 06/11/2013 1014   CO2 24 10/31/2017 0919   CO2 27 06/11/2013 1014   GLUCOSE 96 10/31/2017 0919   GLUCOSE 96 06/25/2017 1205   GLUCOSE 96 06/11/2013 1014   BUN 11 10/31/2017 0919   BUN 11 06/11/2013 1014   CREATININE 0.86 10/31/2017 0919   CREATININE 0.72 06/25/2017 1205   CALCIUM 9.4 10/31/2017 0919   CALCIUM 8.7 06/11/2013 1014   GFRNONAA 87 10/31/2017 0919   GFRNONAA 108 06/25/2017 1205   GFRAA 101 10/31/2017 0919   GFRAA 126 06/25/2017 1205   Lab Results  Component Value Date   HGBA1C 5.2 10/31/2017   Lab Results  Component Value Date   INSULIN 20.2 10/31/2017   CBC    Component Value Date/Time   WBC 8.8 10/31/2017 0919   WBC 8.4 06/25/2017 1205   RBC  4.58 10/31/2017 0919   RBC 4.51 06/25/2017 1205   HGB 13.8 10/31/2017 0919   HCT 42.2 10/31/2017 0919   PLT 259 06/25/2017 1205   PLT 296 12/09/2015 0830   MCV 92 10/31/2017 0919   MCV 91 06/11/2013 1014   MCH 30.1 10/31/2017 0919   MCH 30.4 06/25/2017 1205   MCHC 32.7 10/31/2017 0919   MCHC 33.7 06/25/2017 1205   RDW 13.7 10/31/2017 0919   RDW 12.5 06/11/2013 1014   LYMPHSABS 3.5 (H) 10/31/2017 0919   LYMPHSABS 2.6 06/11/2013 1014   MONOABS 560 12/13/2016 0902   MONOABS 0.4 06/11/2013 1014   EOSABS 0.2 10/31/2017 0919   EOSABS 0.1 06/11/2013 1014   BASOSABS 0.1 10/31/2017 0919   BASOSABS 0.0 06/11/2013 1014   Iron/TIBC/Ferritin/ %Sat No results found for: IRON, TIBC, FERRITIN, IRONPCTSAT Lipid Panel     Component Value Date/Time   CHOL 205 (H) 10/31/2017 0919   CHOL 175 06/11/2013 1014   TRIG 99 10/31/2017 0919   TRIG 52 06/11/2013 1014   HDL 53 10/31/2017 0919   HDL 42 06/11/2013 1014   CHOLHDL 3.5 12/13/2016 0902   VLDL 13 12/13/2016 0902   VLDL 10 06/11/2013 1014   LDLCALC 132 (H) 10/31/2017 0919   LDLCALC 123 (H) 06/11/2013 1014   Hepatic Function Panel     Component Value Date/Time   PROT 7.2 10/31/2017 0919   PROT 7.3 06/11/2013 1014   ALBUMIN 4.3 10/31/2017 0919   ALBUMIN 3.9 06/11/2013 1014   AST 20 10/31/2017 0919   AST 13 (L) 06/11/2013 1014   ALT 20 10/31/2017 0919   ALT 21 06/11/2013 1014   ALKPHOS 73 10/31/2017 0919   ALKPHOS 52 06/11/2013 1014   BILITOT 0.3 10/31/2017 0919   BILITOT 0.4 06/11/2013 1014      Component Value Date/Time   TSH 1.730 10/31/2017 0919   TSH 1.83 12/13/2016 0902   TSH 1.170 12/09/2015 0830   Results for ILEAN, SPRADLIN (MRN [id number]) as of 11/04/2017 08:25  Ref. Range 12/13/2016 09:02  Vitamin D, 25-Hydroxy Latest Ref Range: 30 - 100 ng/mL 28 (L)  ECG  shows NSR with a rate of 75 BPM INDIRECT CALORIMETER done today shows a VO2 of 274 and a REE of 1910.  Her calculated basal metabolic rate is 7782 thus her  basal metabolic rate is better than expected.    ASSESSMENT AND PLAN: Other Leah - Plan: EKG 12-Lead, Comprehensive metabolic panel, CBC With Differential, Hemoglobin A1c, Insulin, random, Lipid Panel With LDL/HDL Ratio, T3, T4, free, TSH, Vitamin B12, Folate  Shortness of breath on exertion  Vitamin D deficiency - Plan: VITAMIN D 25 Hydroxy (Vit-D Deficiency, Fractures)  Depression screening  At risk for heart disease  Class 2 severe obesity with serious comorbidity and body mass index (BMI) of 36.0 to 36.9 in adult, unspecified obesity type [facility]  PLAN: Leah Tammy was informed that her Leah may be related to obesity, depression or many other causes. Labs will be ordered, and in the meanwhile Leah Brown has agreed to work on diet, exercise and weight loss to help with Leah. Proper sleep hygiene was discussed including the need for 7-8 hours of quality sleep each night. A sleep study was not ordered based on symptoms and Epworth score.  Dyspnea on exertion Leah Brown's shortness of breath appears to be obesity related and exercise induced. She has agreed to work on weight loss and gradually increase exercise to treat her exercise induced shortness of breath. If Leah Brown follows our instructions and loses weight without improvement of her shortness of breath, we will plan to refer to pulmonology. We will monitor this condition regularly. Leah Brown agrees to this plan.  Vitamin D Deficiency Leah Brown was informed that low vitamin D levels contributes to Leah and are associated with obesity, breast, and colon cancer. She agrees to continue to take OTC Vit D. We will check labs and follow. Leah Brown will follow up for routine testing of vitamin D, at least 2-3 times per year. She was informed of the risk of over-replacement of vitamin D and agrees to not increase her dose unless she discusses this with [place] first.  Cardiovascular risk counseling Leah Brown was given extended (15 minutes) coronary artery  disease prevention counseling today. She is 37 y.o. female and has risk factors for heart disease including obesity. We discussed intensive lifestyle modifications today with an emphasis on specific weight loss instructions and strategies. Pt was also informed of the importance of increasing exercise and decreasing saturated fats to help prevent heart disease.  Depression Screen Leah Brown had a strongly positive depression screening. Depression is commonly associated with obesity and often results in emotional eating behaviors. We will monitor this closely and work on CBT to help improve the non-hunger eating patterns. Referral to Psychology may be required if no improvement is seen as she continues in our clinic.  Obesity Leah Brown is currently in the action stage of change and her goal is to continue with weight loss efforts. I recommend Leah Brown begin the structured treatment plan as follows:  She has agreed to follow the Category 3 plan Leah Brown has been instructed to eventually work up to a goal of 150 minutes of combined cardio and strengthening exercise per week for weight loss and overall health benefits. We discussed the following Behavioral Modification Strategies today: increasing lean protein intake, decreasing simple carbohydrates  and work on meal planning and easy cooking plans   She was informed of the importance of frequent follow up visits to maximize her success with intensive lifestyle modifications for her multiple health conditions. She was informed we would discuss her lab results at her  next visit unless there is a critical issue that needs to be addressed sooner. Kween agreed to keep her next visit at the agreed upon time to discuss these results.    OBESITY BEHAVIORAL INTERVENTION VISIT  Today's visit was # 1 out of 22.  Starting weight: 205 lbs Starting date: 10/31/17 Today's weight : 205 lbs Today's date: 10/31/2017 Total lbs lost to date: 0 (Patients must lose 7 lbs in the  first 6 months to continue with counseling)   ASK: We discussed the diagnosis of obesity with Saraya Kizzie Ide today and Camron agreed to give [place] permission to discuss obesity behavioral modification therapy today.  ASSESS: Nicolina has the diagnosis of obesity and her BMI today is 36.32 Sandeep is in the action stage of change   ADVISE: Janvi was educated on the multiple health risks of obesity as well as the benefit of weight loss to improve her health. She was advised of the need for long term treatment and the importance of lifestyle modifications.  AGREE: Multiple dietary modification options and treatment options were discussed and  Jazzalynn agreed to the above obesity treatment plan.   I, Doreene Nest, am acting as transcriptionist for  Dennard Nip, MD  I have reviewed the above documentation for accuracy and completeness, and I agree with the above. -Dennard Nip, MD

## 2017-11-13 ENCOUNTER — Ambulatory Visit (INDEPENDENT_AMBULATORY_CARE_PROVIDER_SITE_OTHER): Payer: No Typology Code available for payment source | Admitting: Family Medicine

## 2017-11-13 VITALS — BP 109/70 | HR 70 | Temp 98.6°F | Ht 63.0 in | Wt 201.0 lb

## 2017-11-13 DIAGNOSIS — E8881 Metabolic syndrome: Secondary | ICD-10-CM

## 2017-11-13 DIAGNOSIS — Z9189 Other specified personal risk factors, not elsewhere classified: Secondary | ICD-10-CM | POA: Diagnosis not present

## 2017-11-13 DIAGNOSIS — E559 Vitamin D deficiency, unspecified: Secondary | ICD-10-CM | POA: Diagnosis not present

## 2017-11-13 DIAGNOSIS — Z6835 Body mass index (BMI) 35.0-35.9, adult: Secondary | ICD-10-CM

## 2017-11-13 MED ORDER — VITAMIN D (ERGOCALCIFEROL) 1.25 MG (50000 UNIT) PO CAPS: 50000.0000 [IU] | ORAL_CAPSULE | ORAL | 0 refills | Status: DC

## 2017-11-13 MED ORDER — METFORMIN HCL 500 MG PO TABS: 500.0000 mg | ORAL_TABLET | Freq: Every day | ORAL | 0 refills | Status: DC

## 2017-11-13 MED FILL — VIT D2 1.25 MG (50,000 UNIT: 1.25 MG | 28 days supply | Qty: 4 | Fill #0

## 2017-11-13 MED FILL — metFORMIN HCL 500 MG TABS: 500 | 30 days supply | Qty: 30 | Fill #0

## 2017-11-14 NOTE — Progress Notes (Signed)
Office: [phone]  /  Fax: [phone]   HPI:   Chief Complaint: OBESITY Leah Brown is here to discuss her progress with her obesity treatment plan. She is on the Category 3 plan and is following her eating plan approximately 80-90 % of the time. She states she is exercising 0 minutes 0 times per week. Leah Brown has done well with weight loss on Category 3 plan. Hunger improved and had good support from her husband. She did well with her food options.  Her weight is 201 lb (91.2 kg) today and has had a weight loss of 4 pounds over a period of 2 weeks since her last visit. She has lost 4 lbs since starting treatment with [place].  Vitamin D Deficiency Leah Brown has a new diagnosis of vitamin D deficiency. She is on multivitamins, not yet at goal. She denies nausea, vomiting or muscle weakness.  Insulin Resistance Leah Brown has a new diagnosis of insulin resistance based on her elevated fasting insulin level >5. She has irregular menses with elevated fasting insulin. She notes polyphagia which improved with diet prescription. Although Leah Brown's blood glucose readings are still under good control, insulin resistance puts her at greater risk of metabolic syndrome and diabetes. She is not taking metformin currently and continues to work on diet and exercise to decrease risk of diabetes.  At risk for diabetes Leah Brown is at higher than average risk for developing diabetes due to her obesity and insulin resistance. She currently denies polyuria or polydipsia.  ALLERGIES: Allergies  Allergen Reactions  . Almond (Diagnostic) Hives and Swelling  . Celery Oil Swelling  . Codeine Nausea Only  . Peanut-Containing Drug Products Other (See Comments)    Allergy testing   . Pollen Extract Swelling  . Tree Extract Hives and Swelling    MEDICATIONS: Current Outpatient Medications on File Prior to Visit  Medication Sig Dispense Refill  . caffeine 200 MG TABS tablet Take 0.5 tablets (100 mg total) by mouth daily as  needed.    . cetirizine (ZYRTEC) 10 MG tablet Take 10 mg by mouth as needed.     . Cholecalciferol (VITAMIN D) 2000 units CAPS Take 1 capsule by mouth daily.    . Multiple Vitamin (MULTIVITAMIN WITH MINERALS) TABS tablet Take 1 tablet by mouth daily.     No current facility-administered medications on file prior to visit.     PAST MEDICAL HISTORY: Past Medical History:  Diagnosis Date  . Alcohol abuse   . Amenorrhea 2006   Ongoing since 2006  . Anxiety   . Depression   . Food allergy   . GERD (gastroesophageal reflux disease)   . High cholesterol   . Hypertension   . Infertility, female   . Joint pain   . Multiple food allergies   . Obesity   . Ophthalmic migraine 2006  . Skin cancer   . Snores 12/02/2015  . Vitamin D deficiency     PAST SURGICAL HISTORY: Past Surgical History:  Procedure Laterality Date  . SKIN CANCER DESTRUCTION Left 2008   Breast  . WISDOM TOOTH EXTRACTION      SOCIAL HISTORY: Social History   Tobacco Use  . Smoking status: Former Smoker    Packs/day: 0.50    Years: 4.00    Pack years: 2.00    Types: Cigarettes    Start date: 07/10/1999    Last attempt to quit: 07/10/2003    Years since quitting: 14.3  . Smokeless tobacco: Never Used  Substance Use Topics  .  Alcohol use: Yes    Alcohol/week: 1.2 oz    Types: 1 Glasses of wine, 1 Cans of beer per week    Comment: socially  . Drug use: No    FAMILY HISTORY: Family History  Problem Relation Age of Onset  . Hypertension Father   . Diabetes Father   . Obesity Father   . Hyperlipidemia Father   . Appendicitis Mother 66  . Ovarian cancer Maternal Aunt   . Gallstones Maternal Aunt   . Ovarian cancer Maternal Grandmother   . Breast cancer Neg Hx     ROS: Review of Systems  Constitutional: Positive for weight loss.  Gastrointestinal: Negative for nausea and vomiting.  Genitourinary: Negative for frequency.  Musculoskeletal:       Negative muscle weakness  Endo/Heme/Allergies:  Negative for polydipsia.       Positive polyphagia    PHYSICAL EXAM: Blood pressure 109/70, pulse 70, temperature 98.6 F (37 C), temperature source Oral, height 5\' 3"  (1.6 m), weight 201 lb (91.2 kg), last menstrual period 09/25/2017, SpO2 98 %. Body mass index is 35.61 kg/m. Physical Exam  Constitutional: She is oriented to person, place, and time. She appears well-developed and well-nourished.  Cardiovascular: Normal rate.  Pulmonary/Chest: Effort normal.  Musculoskeletal: Normal range of motion.  Neurological: She is oriented to person, place, and time.  Skin: Skin is warm and dry.  Psychiatric: She has a normal mood and affect. Her behavior is normal.  Vitals reviewed.   RECENT LABS AND TESTS: BMET    Component Value Date/Time   NA 140 10/31/2017 0919   NA 138 06/11/2013 1014   K 4.3 10/31/2017 0919   K 3.8 06/11/2013 1014   CL 102 10/31/2017 0919   CL 102 06/11/2013 1014   CO2 24 10/31/2017 0919   CO2 27 06/11/2013 1014   GLUCOSE 96 10/31/2017 0919   GLUCOSE 96 06/25/2017 1205   GLUCOSE 96 06/11/2013 1014   BUN 11 10/31/2017 0919   BUN 11 06/11/2013 1014   CREATININE 0.86 10/31/2017 0919   CREATININE 0.72 06/25/2017 1205   CALCIUM 9.4 10/31/2017 0919   CALCIUM 8.7 06/11/2013 1014   GFRNONAA 87 10/31/2017 0919   GFRNONAA 108 06/25/2017 1205   GFRAA 101 10/31/2017 0919   GFRAA 126 06/25/2017 1205   Lab Results  Component Value Date   HGBA1C 5.2 10/31/2017   Lab Results  Component Value Date   INSULIN 20.2 10/31/2017   CBC    Component Value Date/Time   WBC 8.8 10/31/2017 0919   WBC 8.4 06/25/2017 1205   RBC 4.58 10/31/2017 0919   RBC 4.51 06/25/2017 1205   HGB 13.8 10/31/2017 0919   HCT 42.2 10/31/2017 0919   PLT 259 06/25/2017 1205   PLT 296 12/09/2015 0830   MCV 92 10/31/2017 0919   MCV 91 06/11/2013 1014   MCH 30.1 10/31/2017 0919   MCH 30.4 06/25/2017 1205   MCHC 32.7 10/31/2017 0919   MCHC 33.7 06/25/2017 1205   RDW 13.7 10/31/2017 0919     RDW 12.5 06/11/2013 1014   LYMPHSABS 3.5 (H) 10/31/2017 0919   LYMPHSABS 2.6 06/11/2013 1014   MONOABS 560 12/13/2016 0902   MONOABS 0.4 06/11/2013 1014   EOSABS 0.2 10/31/2017 0919   EOSABS 0.1 06/11/2013 1014   BASOSABS 0.1 10/31/2017 0919   BASOSABS 0.0 06/11/2013 1014   Iron/TIBC/Ferritin/ %Sat No results found for: IRON, TIBC, FERRITIN, IRONPCTSAT Lipid Panel     Component Value Date/Time   CHOL 205 (H)  10/31/2017 0919   CHOL 175 06/11/2013 1014   TRIG 99 10/31/2017 0919   TRIG 52 06/11/2013 1014   HDL 53 10/31/2017 0919   HDL 42 06/11/2013 1014   CHOLHDL 3.5 12/13/2016 0902   VLDL 13 12/13/2016 0902   VLDL 10 06/11/2013 1014   LDLCALC 132 (H) 10/31/2017 0919   LDLCALC 123 (H) 06/11/2013 1014   Hepatic Function Panel     Component Value Date/Time   PROT 7.2 10/31/2017 0919   PROT 7.3 06/11/2013 1014   ALBUMIN 4.3 10/31/2017 0919   ALBUMIN 3.9 06/11/2013 1014   AST 20 10/31/2017 0919   AST 13 (L) 06/11/2013 1014   ALT 20 10/31/2017 0919   ALT 21 06/11/2013 1014   ALKPHOS 73 10/31/2017 0919   ALKPHOS 52 06/11/2013 1014   BILITOT 0.3 10/31/2017 0919   BILITOT 0.4 06/11/2013 1014      Component Value Date/Time   TSH 1.730 10/31/2017 0919   TSH 1.83 12/13/2016 0902   TSH 1.170 12/09/2015 0830  Results for CORTASIA, SCREWS (MRN [id number]) as of 11/14/2017 13:37  Ref. Range 10/31/2017 09:19  Vitamin D, 25-Hydroxy Latest Ref Range: 30.0 - 100.0 ng/mL 28.3 (L)    ASSESSMENT AND PLAN: Vitamin D deficiency - Plan: Vitamin D, Ergocalciferol, (DRISDOL) 50000 units CAPS capsule  Insulin resistance - Plan: metFORMIN (GLUCOPHAGE) 500 MG tablet  At risk for diabetes mellitus  Class 2 severe obesity with serious comorbidity and body mass index (BMI) of 35.0 to 35.9 in adult, unspecified obesity type ([place])  PLAN:  Vitamin D Deficiency Leah Brown was informed that low vitamin D levels contributes to fatigue and are associated with obesity, breast, and colon cancer.  Leah Brown agrees to start prescription Vit D @50 ,000 IU every week #4 with no refills. She will follow up for routine testing of vitamin D, at least 2-3 times per year. She was informed of the risk of over-replacement of vitamin D and agrees to not increase her dose unless she discusses this with [place] first. Leah Brown agrees to follow up with our clinic in 2 weeks.  Insulin Resistance Leah Brown will continue to work on weight loss, exercise, and decreasing simple carbohydrates in her diet to help decrease the risk of diabetes. We dicussed metformin including benefits and risks. She was informed that eating too many simple carbohydrates or too many calories at one sitting increases the likelihood of GI side effects. Leah Brown agrees to start metformin 500 mg q AM #30 with no refills. Leah Brown agrees to follow up with our clinic in 2 weeks as directed to monitor her progress.  Diabetes risk counselling Leah Brown was given extended (30 minutes) diabetes prevention counseling today. She is 36 y.o. female and has risk factors for diabetes including obesity and insulin resistance. We discussed intensive lifestyle modifications today with an emphasis on weight loss as well as increasing exercise and decreasing simple carbohydrates in her diet.  Obesity Leah Brown is currently in the action stage of change. As such, her goal is to continue with weight loss efforts She has agreed to follow the Category 3 plan Leah Brown has been instructed to work up to a goal of 150 minutes of combined cardio and strengthening exercise per week for weight loss and overall health benefits. We discussed the following Behavioral Modification Strategies today: increasing lean protein intake, decreasing simple carbohydrates  and work on meal planning and easy cooking plans   Leah Brown has agreed to follow up with our clinic in 2 weeks. She was informed of the  importance of frequent follow up visits to maximize her success with intensive lifestyle modifications for her  multiple health conditions.   OBESITY BEHAVIORAL INTERVENTION VISIT  Today's visit was # 2 out of 22.  Starting weight: 205 lbs Starting date: 10/31/17 Today's weight : 201 lbs Today's date: 11/13/2017 Total lbs lost to date: 4 (Patients must lose 7 lbs in the first 6 months to continue with counseling)   ASK: We discussed the diagnosis of obesity with Leah Brown today and Leah Brown agreed to give [place] permission to discuss obesity behavioral modification therapy today.  ASSESS: Leah Brown has the diagnosis of obesity and her BMI today is 35.61 Leah Brown is in the action stage of change   ADVISE: Leah Brown was educated on the multiple health risks of obesity as well as the benefit of weight loss to improve her health. She was advised of the need for long term treatment and the importance of lifestyle modifications.  AGREE: Multiple dietary modification options and treatment options were discussed and  Leah Brown agreed to the above obesity treatment plan.  I, Trixie Dredge, am acting as transcriptionist for Dennard Nip, MD  I have reviewed the above documentation for accuracy and completeness, and I agree with the above. -Dennard Nip, MD

## 2017-11-18 ENCOUNTER — Encounter: Payer: Self-pay | Admitting: Family Medicine

## 2017-11-18 ENCOUNTER — Ambulatory Visit (INDEPENDENT_AMBULATORY_CARE_PROVIDER_SITE_OTHER): Payer: No Typology Code available for payment source | Admitting: Family Medicine

## 2017-11-18 VITALS — BP 110/64 | HR 92 | Temp 98.9°F | Resp 14 | Ht 63.0 in | Wt 206.4 lb

## 2017-11-18 DIAGNOSIS — F3341 Major depressive disorder, recurrent, in partial remission: Secondary | ICD-10-CM | POA: Insufficient documentation

## 2017-11-18 DIAGNOSIS — Z6836 Body mass index (BMI) 36.0-36.9, adult: Secondary | ICD-10-CM | POA: Diagnosis not present

## 2017-11-18 DIAGNOSIS — E559 Vitamin D deficiency, unspecified: Secondary | ICD-10-CM

## 2017-11-18 DIAGNOSIS — Z3009 Encounter for other general counseling and advice on contraception: Secondary | ICD-10-CM

## 2017-11-18 DIAGNOSIS — F32A Depression, unspecified: Secondary | ICD-10-CM | POA: Insufficient documentation

## 2017-11-18 DIAGNOSIS — F330 Major depressive disorder, recurrent, mild: Secondary | ICD-10-CM | POA: Diagnosis not present

## 2017-11-18 DIAGNOSIS — E6609 Other obesity due to excess calories: Secondary | ICD-10-CM | POA: Diagnosis not present

## 2017-11-18 DIAGNOSIS — F329 Major depressive disorder, single episode, unspecified: Secondary | ICD-10-CM | POA: Insufficient documentation

## 2017-11-18 LAB — POCT URINE PREGNANCY: PREG TEST UR: NEGATIVE

## 2017-11-18 MED ORDER — CITALOPRAM HYDROBROMIDE 10 MG PO TABS: 10.0000 mg | ORAL_TABLET | Freq: Every day | ORAL | 3 refills | Status: DC

## 2017-11-18 MED ORDER — NORGESTIM-ETH ESTRAD TRIPHASIC 0.18/0.215/0.25 MG-25 MCG PO TABS: 1.0000 | ORAL_TABLET | Freq: Every day | ORAL | 11 refills | Status: DC

## 2017-11-18 MED FILL — NORG-EE 0.18-0.215-0.25/0.0: 0.18/0.215/ | 28 days supply | Qty: 28 | Fill #0

## 2017-11-18 MED FILL — CITALOPRAM HBR 10 MG TABLET: 10 | 90 days supply | Qty: 90 | Fill #0

## 2017-11-18 NOTE — Assessment & Plan Note (Signed)
On Rx vitamin D through another office

## 2017-11-18 NOTE — Assessment & Plan Note (Signed)
Patient denies any hx of bipolar d/o; discussed risk of mania/hypomania with unopposed SSRIs; she wishes to start the citalopram again; 10 mg Rx prescribed; call with problems

## 2017-11-18 NOTE — Patient Instructions (Addendum)
Start back on the citalopram Start the oral contraceptive after we get your urine results Use phone times or reminders to keep on time  Check out the information at familydoctor.org entitled "Nutrition for Weight Loss: What You Need to Know about Fad Diets" Try to lose between 1-2 pounds per week by taking in fewer calories and burning off more calories You can succeed by limiting portions, limiting foods dense in calories and fat, becoming more active, and drinking 8 glasses of water a day (64 ounces) Don't skip meals, especially breakfast, as skipping meals may alter your metabolism Do not use over-the-counter weight loss pills or gimmicks that claim rapid weight loss A healthy BMI (or body mass index) is between 18.5 and 24.9 You can calculate your ideal BMI at the [place] website [employer]

## 2017-11-18 NOTE — Assessment & Plan Note (Signed)
Working with weight loss clinic; now on metformin which may increase fertility; continue f/u with weight loss clinic provider

## 2017-11-18 NOTE — Progress Notes (Signed)
BP 110/64   Pulse 92   Temp 98.9 F (37.2 C) (Oral)   Resp 14   Ht 5\' 3"  (1.6 m)   Wt 206 lb 6.4 oz (93.6 kg)   LMP 09/25/2017   SpO2 96%   BMI 36.56 kg/m    Subjective:    Patient ID: Leah Brown, female    DOB: [birth year], 37 y.o.   MRN: [id number]  Leah: Leah Brown is a 37 y.o. female  Chief Complaint  Patient presents with  . Follow-up  . Medication Refill  . Depression    citilapram  . Contraception    pt is on metformin and told her she could become more fertile    Leah Patient is here for f/u  She has been going to see Dr. Leafy Ro, just started metformin; no stomach upset She is aware that it can make her more fertile No desire to conceive right now Has not been using any contraception Was tested for POCS and it was negative in 2010 Not actively trying to get pregnant, using pull-out method She went on-line to look at different options; she used to take the pill, and not the best with taking the pill She is considering the depo She is thinking more for a year, instead of longer term Last menstrual period in March No morning nausea; no breast tenderness  She was found to have low vitamin D and is taking Rx for this; last level was 28.3 (10/31/17) Last random insulin was normal, as was her A1c  No hx of DVT and PE, does get migraines; very seasonal, spring and fall; gets the light and sound sensitivity, brain fog; no aura prior to headaches; no hx of MI, no CVA, no breast cancer Last pap smear Dec 06, 2015; possible polyp a while back, saw OB-GYN, nothing of concern  She had been on citalopram and would like to go back on it; has been on 20 mg and then 40 mg; would like to start back on 10 mg now for some work stressors; dampen the work agitation  Depression screen [facility] 2/9 11/18/2017 10/31/2017 07/18/2017 06/25/2017 12/13/2016  Decreased Interest 0 3 0 0 0  Down, Depressed, Hopeless 0 3 0 0 0  PHQ - 2 Score 0 6 0 0 0  Altered sleeping - 3 - - -    Tired, decreased energy - 3 - - -  Change in appetite - 1 - - -  Feeling bad or failure about yourself  - 2 - - -  Trouble concentrating - 1 - - -  Moving slowly or fidgety/restless - 1 - - -  Suicidal thoughts - 0 - - -  PHQ-9 Score - 17 - - -    Relevant past medical, surgical, family and social history reviewed Past Medical History:  Diagnosis Date  . Alcohol abuse   . Amenorrhea 2006   Ongoing since 2006  . Anxiety   . Depression   . Food allergy   . GERD (gastroesophageal reflux disease)   . High cholesterol   . Hypertension   . Infertility, female   . Insulin resistance   . Joint pain   . Multiple food allergies   . Obesity   . Ophthalmic migraine 2006  . Skin cancer   . Snores 12/02/2015  . Vitamin D deficiency    Past Surgical History:  Procedure Laterality Date  . SKIN CANCER DESTRUCTION Left 2008   Breast  . WISDOM TOOTH EXTRACTION  Family History  Problem Relation Age of Onset  . Hypertension Father   . Diabetes Father   . Obesity Father   . Hyperlipidemia Father   . Appendicitis Mother 81  . Ovarian cancer Maternal Aunt   . Gallstones Maternal Aunt   . Ovarian cancer Maternal Grandmother   . Breast cancer Neg Hx    Social History   Tobacco Use  . Smoking status: Former Smoker    Packs/day: 0.50    Years: 4.00    Pack years: 2.00    Types: Cigarettes    Start date: 07/10/1999    Last attempt to quit: 07/10/2003    Years since quitting: 14.3  . Smokeless tobacco: Never Used  Substance Use Topics  . Alcohol use: Yes    Alcohol/week: 1.2 oz    Types: 1 Glasses of wine, 1 Cans of beer per week    Comment: socially  . Drug use: No    Interim medical history since last visit reviewed. Allergies and medications reviewed  Review of Systems Per Leah unless specifically indicated above     Objective:    BP 110/64   Pulse 92   Temp 98.9 F (37.2 C) (Oral)   Resp 14   Ht 5\' 3"  (1.6 m)   Wt 206 lb 6.4 oz (93.6 kg)   LMP 09/25/2017    SpO2 96%   BMI 36.56 kg/m   Wt Readings from Last 3 Encounters:  11/18/17 206 lb 6.4 oz (93.6 kg)  11/13/17 201 lb (91.2 kg)  10/31/17 205 lb (93 kg)    Physical Exam  Constitutional: She appears well-developed and well-nourished.  obese  HENT:  Mouth/Throat: Mucous membranes are normal.  Eyes: EOM are normal. No scleral icterus.  Cardiovascular: Normal rate and regular rhythm.  Pulmonary/Chest: Effort normal and breath sounds normal.  Psychiatric: She has a normal mood and affect. Her behavior is normal.      Assessment & Plan:   Problem List Items Addressed This Visit      Other   Vitamin D deficiency    On Rx vitamin D through another office      Obesity    Working with weight loss clinic; now on metformin which may increase fertility; continue f/u with weight loss clinic provider      Depression - Primary    Patient denies any hx of bipolar d/o; discussed risk of mania/hypomania with unopposed SSRIs; she wishes to start the citalopram again; 10 mg Rx prescribed; call with problems      Relevant Medications   citalopram (CELEXA) 10 MG tablet    Other Visit Diagnoses    General counseling and advice for contraceptive management       discussed options; no true aura; does not want weight gain (Depo); she will start back on OCPs after urine hCG today; discussed risks   Relevant Orders   POCT urine pregnancy (Completed)       Follow up plan: No follow-ups on file.  An after-visit summary was printed and given to the patient at [facility].  Please see the patient instructions which may contain other information and recommendations beyond what is mentioned above in the assessment and plan.  Meds ordered this encounter  Medications  . Norgestimate-Ethinyl Estradiol Triphasic (ORTHO TRI-CYCLEN LO) 0.18/0.215/0.25 MG-25 MCG tab    Sig: Take 1 tablet by mouth daily.    Dispense:  1 Package    Refill:  11  . citalopram (CELEXA) 10 MG tablet  Sig: Take 1 tablet (10  mg total) by mouth daily.    Dispense:  90 tablet    Refill:  3    Orders Placed This Encounter  Procedures  . POCT urine pregnancy

## 2017-11-25 ENCOUNTER — Ambulatory Visit (INDEPENDENT_AMBULATORY_CARE_PROVIDER_SITE_OTHER): Payer: No Typology Code available for payment source | Admitting: Family Medicine

## 2017-11-25 VITALS — BP 98/56 | HR 66 | Temp 98.1°F | Ht 63.0 in | Wt 200.0 lb

## 2017-11-25 DIAGNOSIS — E8881 Metabolic syndrome: Secondary | ICD-10-CM | POA: Diagnosis not present

## 2017-11-25 DIAGNOSIS — Z9189 Other specified personal risk factors, not elsewhere classified: Secondary | ICD-10-CM | POA: Diagnosis not present

## 2017-11-25 DIAGNOSIS — Z6835 Body mass index (BMI) 35.0-35.9, adult: Secondary | ICD-10-CM

## 2017-11-25 DIAGNOSIS — E559 Vitamin D deficiency, unspecified: Secondary | ICD-10-CM | POA: Diagnosis not present

## 2017-11-25 MED ORDER — METFORMIN HCL 500 MG PO TABS: 500.0000 mg | ORAL_TABLET | Freq: Every day | ORAL | 0 refills | Status: DC

## 2017-11-25 MED ORDER — VITAMIN D (ERGOCALCIFEROL) 1.25 MG (50000 UNIT) PO CAPS: 50000.0000 [IU] | ORAL_CAPSULE | ORAL | 0 refills | Status: DC

## 2017-11-26 NOTE — Progress Notes (Signed)
Office: [phone]  /  Fax: [phone]   HPI:   Chief Complaint: OBESITY Leah Brown is here to discuss her progress with her obesity treatment plan. She is on the Category 3 plan and is following her eating plan approximately 75 % of the time. She states she is exercising 0 minutes 0 times per week. Leah Brown has had increased challenges with family increasing her temptations and she has questions about how to deal with this. Her weight is 200 lb (90.7 kg) today and has had a weight loss of 1 pound over a period of 2 to 3 weeks since her last visit. She has lost 5 lbs since starting treatment with [place].  Vitamin D deficiency Leah Brown has a diagnosis of vitamin D deficiency. She is stable on vit D and denies nausea, vomiting or muscle weakness.  Pre-Diabetes Leah Brown has a diagnosis of prediabetes based on her elevated Hgb A1c and was informed this puts her at greater risk of developing diabetes. Leah Brown is stable on metformin and she continues to work on diet and exercise to decrease risk of diabetes. She denies nausea, vomiting or hypoglycemia.  At risk for diabetes Leah Brown is at higher than average risk for developing diabetes due to her obesity and pre-diabetes. She currently denies polyuria or polydipsia.  ALLERGIES: Allergies  Allergen Reactions  . Almond (Diagnostic) Hives and Swelling  . Celery Oil Swelling  . Codeine Nausea Only  . Peanut-Containing Drug Products Other (See Comments)    Allergy testing   . Pollen Extract Swelling  . Tree Extract Hives and Swelling    MEDICATIONS: Current Outpatient Medications on File Prior to Visit  Medication Sig Dispense Refill  . caffeine 200 MG TABS tablet Take 0.5 tablets (100 mg total) by mouth daily as needed.    . cetirizine (ZYRTEC) 10 MG tablet Take 10 mg by mouth as needed.     . citalopram (CELEXA) 10 MG tablet Take 1 tablet (10 mg total) by mouth daily. 90 tablet 3  . Multiple Vitamin (MULTIVITAMIN WITH MINERALS) TABS tablet Take 1  tablet by mouth daily.    . Norgestimate-Ethinyl Estradiol Triphasic (ORTHO TRI-CYCLEN LO) 0.18/0.215/0.25 MG-25 MCG tab Take 1 tablet by mouth daily. 1 Package 11   No current facility-administered medications on file prior to visit.     PAST MEDICAL HISTORY: Past Medical History:  Diagnosis Date  . Alcohol abuse   . Amenorrhea 2006   Ongoing since 2006  . Anxiety   . Depression   . Food allergy   . GERD (gastroesophageal reflux disease)   . High cholesterol   . Hypertension   . Infertility, female   . Insulin resistance   . Joint pain   . Multiple food allergies   . Obesity   . Ophthalmic migraine 2006  . Skin cancer   . Snores 12/02/2015  . Vitamin D deficiency     PAST SURGICAL HISTORY: Past Surgical History:  Procedure Laterality Date  . SKIN CANCER DESTRUCTION Left 2008   Breast  . WISDOM TOOTH EXTRACTION      SOCIAL HISTORY: Social History   Tobacco Use  . Smoking status: Former Smoker    Packs/day: 0.50    Years: 4.00    Pack years: 2.00    Types: Cigarettes    Start date: 07/10/1999    Last attempt to quit: 07/10/2003    Years since quitting: 14.3  . Smokeless tobacco: Never Used  Substance Use Topics  . Alcohol use: Yes  Alcohol/week: 1.2 oz    Types: 1 Glasses of wine, 1 Cans of beer per week    Comment: socially  . Drug use: No    FAMILY HISTORY: Family History  Problem Relation Age of Onset  . Hypertension Father   . Diabetes Father   . Obesity Father   . Hyperlipidemia Father   . Appendicitis Mother 14  . Ovarian cancer Maternal Aunt   . Gallstones Maternal Aunt   . Ovarian cancer Maternal Grandmother   . Breast cancer Neg Hx     ROS: Review of Systems  Constitutional: Positive for weight loss.  Gastrointestinal: Negative for nausea and vomiting.  Genitourinary: Negative for frequency.  Musculoskeletal:       Negative for muscle weakness  Endo/Heme/Allergies: Negative for polydipsia.       Negative for hypoglycemia     PHYSICAL EXAM: Blood pressure (!) 98/56, pulse 66, temperature 98.1 F (36.7 C), temperature source Oral, height 5\' 3"  (1.6 m), weight 200 lb (90.7 kg), last menstrual period 09/28/2017, SpO2 99 %. Body mass index is 35.43 kg/m. Physical Exam  Constitutional: She is oriented to person, place, and time. She appears well-developed and well-nourished.  Cardiovascular: Normal rate.  Pulmonary/Chest: Effort normal.  Musculoskeletal: Normal range of motion.  Neurological: She is oriented to person, place, and time.  Skin: Skin is warm and dry.  Psychiatric: She has a normal mood and affect. Her behavior is normal.  Vitals reviewed.   RECENT LABS AND TESTS: BMET    Component Value Date/Time   NA 140 10/31/2017 0919   NA 138 06/11/2013 1014   K 4.3 10/31/2017 0919   K 3.8 06/11/2013 1014   CL 102 10/31/2017 0919   CL 102 06/11/2013 1014   CO2 24 10/31/2017 0919   CO2 27 06/11/2013 1014   GLUCOSE 96 10/31/2017 0919   GLUCOSE 96 06/25/2017 1205   GLUCOSE 96 06/11/2013 1014   BUN 11 10/31/2017 0919   BUN 11 06/11/2013 1014   CREATININE 0.86 10/31/2017 0919   CREATININE 0.72 06/25/2017 1205   CALCIUM 9.4 10/31/2017 0919   CALCIUM 8.7 06/11/2013 1014   GFRNONAA 87 10/31/2017 0919   GFRNONAA 108 06/25/2017 1205   GFRAA 101 10/31/2017 0919   GFRAA 126 06/25/2017 1205   Lab Results  Component Value Date   HGBA1C 5.2 10/31/2017   Lab Results  Component Value Date   INSULIN 20.2 10/31/2017   CBC    Component Value Date/Time   WBC 8.8 10/31/2017 0919   WBC 8.4 06/25/2017 1205   RBC 4.58 10/31/2017 0919   RBC 4.51 06/25/2017 1205   HGB 13.8 10/31/2017 0919   HCT 42.2 10/31/2017 0919   PLT 259 06/25/2017 1205   PLT 296 12/09/2015 0830   MCV 92 10/31/2017 0919   MCV 91 06/11/2013 1014   MCH 30.1 10/31/2017 0919   MCH 30.4 06/25/2017 1205   MCHC 32.7 10/31/2017 0919   MCHC 33.7 06/25/2017 1205   RDW 13.7 10/31/2017 0919   RDW 12.5 06/11/2013 1014   LYMPHSABS 3.5  (H) 10/31/2017 0919   LYMPHSABS 2.6 06/11/2013 1014   MONOABS 560 12/13/2016 0902   MONOABS 0.4 06/11/2013 1014   EOSABS 0.2 10/31/2017 0919   EOSABS 0.1 06/11/2013 1014   BASOSABS 0.1 10/31/2017 0919   BASOSABS 0.0 06/11/2013 1014   Iron/TIBC/Ferritin/ %Sat No results found for: IRON, TIBC, FERRITIN, IRONPCTSAT Lipid Panel     Component Value Date/Time   CHOL 205 (H) 10/31/2017 0919  CHOL 175 06/11/2013 1014   TRIG 99 10/31/2017 0919   TRIG 52 06/11/2013 1014   HDL 53 10/31/2017 0919   HDL 42 06/11/2013 1014   CHOLHDL 3.5 12/13/2016 0902   VLDL 13 12/13/2016 0902   VLDL 10 06/11/2013 1014   LDLCALC 132 (H) 10/31/2017 0919   LDLCALC 123 (H) 06/11/2013 1014   Hepatic Function Panel     Component Value Date/Time   PROT 7.2 10/31/2017 0919   PROT 7.3 06/11/2013 1014   ALBUMIN 4.3 10/31/2017 0919   ALBUMIN 3.9 06/11/2013 1014   AST 20 10/31/2017 0919   AST 13 (L) 06/11/2013 1014   ALT 20 10/31/2017 0919   ALT 21 06/11/2013 1014   ALKPHOS 73 10/31/2017 0919   ALKPHOS 52 06/11/2013 1014   BILITOT 0.3 10/31/2017 0919   BILITOT 0.4 06/11/2013 1014      Component Value Date/Time   TSH 1.730 10/31/2017 0919   TSH 1.83 12/13/2016 0902   TSH 1.170 12/09/2015 0830   Results for DAINA, CARA (MRN [id number]) as of 11/26/2017 07:48  Ref. Range 10/31/2017 09:19  Vitamin D, 25-Hydroxy Latest Ref Range: 30.0 - 100.0 ng/mL 28.3 (L)   ASSESSMENT AND PLAN: Insulin resistance - Plan: metFORMIN (GLUCOPHAGE) 500 MG tablet  Vitamin D deficiency - Plan: Vitamin D, Ergocalciferol, (DRISDOL) 50000 units CAPS capsule  At risk for diabetes mellitus  Class 2 severe obesity with serious comorbidity and body mass index (BMI) of 35.0 to 35.9 in adult, unspecified obesity type ([place])  PLAN:  Vitamin D Deficiency Leah Brown was informed that low vitamin D levels contributes to fatigue and are associated with obesity, breast, and colon cancer. She agrees to continue to take prescription  Vit D @50 ,000 IU every week #4 with no refills and will follow up for routine testing of vitamin D, at least 2-3 times per year. She was informed of the risk of over-replacement of vitamin D and agrees to not increase her dose unless she discusses this with [place] first. Leah Brown agrees to follow up as directed.  Pre-Diabetes Leah Brown will continue to work on weight loss, exercise, and decreasing simple carbohydrates in her diet to help decrease the risk of diabetes. We dicussed metformin including benefits and risks. She was informed that eating too many simple carbohydrates or too many calories at one sitting increases the likelihood of GI side effects. Leah Brown requested metformin for now and a prescription was written today for 1 month refill. Leah Brown agreed to follow up with [place] as directed to monitor her progress.  Diabetes risk counseling Leah Brown was given extended (15 minutes) diabetes prevention counseling today. She is 37 y.o. female and has risk factors for diabetes including obesity and pre-diabetes. We discussed intensive lifestyle modifications today with an emphasis on weight loss as well as increasing exercise and decreasing simple carbohydrates in her diet.  Obesity Leah Brown is currently in the action stage of change. As such, her goal is to continue with weight loss efforts She has agreed to follow the Category 3 plan Leah Brown has been instructed to work up to a goal of 150 minutes of combined cardio and strengthening exercise per week for weight loss and overall health benefits. We discussed the following Behavioral Modification Strategies today: increasing lean protein intake, decreasing simple carbohydrates , work on meal planning and easy cooking plans and dealing with family or coworker sabotage Eating out strategies were discussed today.  Leah Brown has agreed to follow up with our clinic in 2 to 3 weeks. She was  informed of the importance of frequent follow up visits to maximize her success with  intensive lifestyle modifications for her multiple health conditions.   OBESITY BEHAVIORAL INTERVENTION VISIT  Today's visit was # 3 out of 22.  Starting weight: 205 lbs Starting date: 10/31/17 Today's weight : 200 lbs Today's date: 11/25/2017 Total lbs lost to date: 5 (Patients must lose 7 lbs in the first 6 months to continue with counseling)   ASK: We discussed the diagnosis of obesity with Leah Brown today and Leah Brown agreed to give [place] permission to discuss obesity behavioral modification therapy today.  ASSESS: Leah Brown has the diagnosis of obesity and her BMI today is 35.44 Leah Brown is in the action stage of change   ADVISE: Leah Brown was educated on the multiple health risks of obesity as well as the benefit of weight loss to improve her health. She was advised of the need for long term treatment and the importance of lifestyle modifications.  AGREE: Multiple dietary modification options and treatment options were discussed and  Leah Brown agreed to the above obesity treatment plan.  I, Doreene Nest, am acting as transcriptionist for Dennard Nip, MD  I have reviewed the above documentation for accuracy and completeness, and I agree with the above. -Dennard Nip, MD

## 2017-12-11 MED FILL — NORG-EE 0.18-0.215-0.25/0.0: 0.18/0.215/ | 84 days supply | Qty: 84 | Fill #1

## 2017-12-11 MED FILL — VIT D2 1.25 MG (50,000 UNIT: 1.25 MG | 28 days supply | Qty: 4 | Fill #0

## 2017-12-11 MED FILL — metFORMIN HCL 500 MG TABS: 500 | 30 days supply | Qty: 30 | Fill #0

## 2017-12-13 ENCOUNTER — Encounter (INDEPENDENT_AMBULATORY_CARE_PROVIDER_SITE_OTHER): Payer: Self-pay | Admitting: Family Medicine

## 2017-12-16 ENCOUNTER — Ambulatory Visit (INDEPENDENT_AMBULATORY_CARE_PROVIDER_SITE_OTHER): Payer: No Typology Code available for payment source | Admitting: Family Medicine

## 2017-12-16 ENCOUNTER — Encounter: Payer: Self-pay | Admitting: Family Medicine

## 2017-12-16 VITALS — BP 118/78 | HR 92 | Temp 98.6°F | Resp 14 | Ht 64.0 in | Wt 203.3 lb

## 2017-12-16 DIAGNOSIS — E6609 Other obesity due to excess calories: Secondary | ICD-10-CM | POA: Diagnosis not present

## 2017-12-16 DIAGNOSIS — Z6836 Body mass index (BMI) 36.0-36.9, adult: Secondary | ICD-10-CM

## 2017-12-16 DIAGNOSIS — Z8041 Family history of malignant neoplasm of ovary: Secondary | ICD-10-CM | POA: Diagnosis not present

## 2017-12-16 DIAGNOSIS — N921 Excessive and frequent menstruation with irregular cycle: Secondary | ICD-10-CM | POA: Diagnosis not present

## 2017-12-16 DIAGNOSIS — Z0000 Encounter for general adult medical examination without abnormal findings: Secondary | ICD-10-CM | POA: Insufficient documentation

## 2017-12-16 DIAGNOSIS — D239 Other benign neoplasm of skin, unspecified: Secondary | ICD-10-CM

## 2017-12-16 NOTE — Assessment & Plan Note (Signed)
Encouraged weight loss 

## 2017-12-16 NOTE — Progress Notes (Signed)
Patient ID: Leah Brown, female   DOB: [birth year], 37 y.o.   MRN: [id number]   Subjective:   Leah Brown is a 37 y.o. female here for a complete physical exam  Interim issues since last visit: she has been on birth control pills; on it for one month now and has had two periods and terrible cramping; she started to bleed during the third week of pills, heavy bleeding; lasted 5 days; then stopped, then had regular period during placebo week; no regular periods for quite some time prior to this; would like to see GYN  USPSTF grade A and B recommendations Depression:  Depression screen [facility] 2/9 12/16/2017 11/18/2017 10/31/2017 07/18/2017 06/25/2017  Decreased Interest 0 0 3 0 0  Down, Depressed, Hopeless 1 0 3 0 0  PHQ - 2 Score 1 0 6 0 0  Altered sleeping 1 - 3 - -  Tired, decreased energy 1 - 3 - -  Change in appetite 0 - 1 - -  Feeling bad or failure about yourself  0 - 2 - -  Trouble concentrating 0 - 1 - -  Moving slowly or fidgety/restless 0 - 1 - -  Suicidal thoughts 0 - 0 - -  PHQ-9 Score 3 - 17 - -  Difficult doing work/chores Not difficult at all - - - -  thinks it is the rain; not clinically depressed; cramping, OCPs, not motivated, in pain with period  Hypertension: BP Readings from Last 3 Encounters:  12/16/17 118/78  11/25/17 (!) 98/56  11/18/17 110/64   Obesity: Wt Readings from Last 3 Encounters:  12/16/17 203 lb 4.8 oz (92.2 kg)  11/25/17 200 lb (90.7 kg)  11/18/17 206 lb 6.4 oz (93.6 kg)   BMI Readings from Last 3 Encounters:  12/16/17 34.90 kg/m  11/25/17 35.43 kg/m  11/18/17 36.56 kg/m    Skin cancer: had one worrisome mole; went to [facility] and they cut it out; wants it looked at; right flank; dysplastic nevus Lung cancer:  Hx of smoker, quit 13 years ago Breast cancer: no lumps Colorectal cancer: no fam hx Cervical cancer screening: done May 2017 BRCA gene screening: family hx of breast and/or ovarian cancer and/or metastatic  prostate cancer? Ovarian cancer; already did the screening at [facility], NEGATIVE HIV, hep B, hep C: n/a STD testing and prevention (chl/gon/syphilis): n/a Intimate partner violence: no abuse Contraception: yes, OCPs Osteoporosis: no steroids Fall prevention/vitamin D: discussed taking vit D weekly, prescribed by Dr. Leafy Ro Immunizations: UTD; had HPV early 37s Diet: getting better; doing [facility] with Dr. Leafy Ro Exercise: not yet, not at that point in the program Alcohol: 1-2 a week Tobacco use: quit AAA: n/a Aspirin:n/a Glucose:  Glucose  Date Value Ref Range Status  10/31/2017 96 65 - 99 mg/dL Final  12/09/2015 96 65 - 99 mg/dL Final  06/11/2013 96 65 - 99 mg/dL Final   Glucose, Bld  Date Value Ref Range Status  06/25/2017 96 65 - 99 mg/dL Final    Comment:    .            Fasting reference interval .   12/13/2016 90 65 - 99 mg/dL Final   Lipids:  Lab Results  Component Value Date   CHOL 205 (H) 10/31/2017   CHOL 188 12/13/2016   CHOL 209 (H) 12/09/2015   Lab Results  Component Value Date   HDL 53 10/31/2017   HDL 53 12/13/2016   HDL 51 12/09/2015   Lab Results  Component  Value Date   LDLCALC 132 (H) 10/31/2017   LDLCALC 122 (H) 12/13/2016   LDLCALC 143 (H) 12/09/2015   Lab Results  Component Value Date   TRIG 99 10/31/2017   TRIG 64 12/13/2016   TRIG 73 12/09/2015   Lab Results  Component Value Date   CHOLHDL 3.5 12/13/2016   No results found for: LDLDIRECT   Past Medical History:  Diagnosis Date  . Alcohol abuse   . Amenorrhea 2006   Ongoing since 2006  . Anxiety   . Depression   . Food allergy   . GERD (gastroesophageal reflux disease)   . High cholesterol   . Hypertension   . Infertility, female   . Insulin resistance   . Joint pain   . Multiple food allergies   . Obesity   . Ophthalmic migraine 2006  . Skin cancer   . Snores 12/02/2015  . Vitamin D deficiency    Past Surgical History:  Procedure Laterality Date  . SKIN CANCER  DESTRUCTION Left 2008   Breast  . WISDOM TOOTH EXTRACTION     Family History  Problem Relation Age of Onset  . Hypertension Father   . Diabetes Father   . Obesity Father   . Hyperlipidemia Father   . Appendicitis Mother 81  . Ovarian cancer Maternal Aunt   . Gallstones Maternal Aunt   . Ovarian cancer Maternal Grandmother   . Breast cancer Neg Hx    Social History   Tobacco Use  . Smoking status: Former Smoker    Packs/day: 0.50    Years: 4.00    Pack years: 2.00    Types: Cigarettes    Start date: 07/10/1999    Last attempt to quit: 07/10/2003    Years since quitting: 14.4  . Smokeless tobacco: Never Used  Substance Use Topics  . Alcohol use: Yes    Alcohol/week: 1.2 oz    Types: 1 Glasses of wine, 1 Cans of beer per week    Comment: socially  . Drug use: No   Review of Systems  Constitutional: Negative for unexpected weight change.  HENT: Negative for hearing loss.   Eyes: Negative for visual disturbance.  Gastrointestinal: Negative for blood in stool.  Endocrine: Negative for polydipsia.  Genitourinary: Negative for hematuria.  Allergic/Immunologic: Positive for food allergies (peanuts; did blood work; never had a reaction).  Neurological: Negative for tremors.  Hematological: Does not bruise/bleed easily.  Psychiatric/Behavioral: Negative for dysphoric mood.    Objective:   Vitals:   12/16/17 0829  BP: 118/78  Pulse: 92  Resp: 14  Temp: 98.6 F (37 C)  TempSrc: Oral  SpO2: 97%  Weight: 203 lb 4.8 oz (92.2 kg)  Height: _0  (1.626 m)   Body mass index is 34.9 kg/m. Wt Readings from Last 3 Encounters:  12/16/17 203 lb 4.8 oz (92.2 kg)  11/25/17 200 lb (90.7 kg)  11/18/17 206 lb 6.4 oz (93.6 kg)   Physical Exam  Constitutional: She appears well-developed and well-nourished.  HENT:  Head: Normocephalic and atraumatic.  Right Ear: Hearing, tympanic membrane, external ear and ear canal normal.  Left Ear: Hearing, tympanic membrane, external ear and  ear canal normal.  Eyes: Conjunctivae and EOM are normal. Right eye exhibits no hordeolum. Left eye exhibits no hordeolum. No scleral icterus.  Neck: Carotid bruit is not present. No thyromegaly present.  Cardiovascular: Normal rate, regular rhythm, S1 normal, S2 normal and normal heart sounds.  No extrasystoles are present.  Pulmonary/Chest: Effort normal  and breath sounds normal. No respiratory distress. Right breast exhibits no inverted nipple, no mass, no nipple discharge, no skin change and no tenderness. Left breast exhibits no inverted nipple, no mass, no nipple discharge, no skin change and no tenderness. Breasts are symmetrical.  Abdominal: Soft. Normal appearance and bowel sounds are normal. She exhibits no distension, no abdominal bruit, no pulsatile midline mass and no mass. There is no hepatosplenomegaly. There is no tenderness. No hernia.  Musculoskeletal: Normal range of motion. She exhibits no edema.  Lymphadenopathy:       Head (right side): No submandibular adenopathy present.       Head (left side): No submandibular adenopathy present.    She has no cervical adenopathy.    She has no axillary adenopathy.  Neurological: She is alert. She displays no tremor. No cranial nerve deficit. She exhibits normal muscle tone. Gait normal.  Reflex Scores:      Patellar reflexes are 2+ on the right side and 2+ on the left side. Skin: Skin is warm and dry. No bruising and no ecchymosis noted. No cyanosis. No pallor.  Surgical scar at site of prevous skin lesion excision LEFT flank; dark brown pigmentation within the scar  Psychiatric: Her speech is normal and behavior is normal. Thought content normal. Her mood appears not anxious. She does not exhibit a depressed mood.    Assessment/Plan:   Problem List Items Addressed This Visit      Other   Family hx of ovarian malignancy   Relevant Orders   Ambulatory referral to Gynecology   Preventative health care - Primary    USPSTF grade A  and B recommendations reviewed with patient; age-appropriate recommendations, preventive care, screening tests, etc discussed and encouraged; healthy living encouraged; see AVS for patient education given to patient       Obesity    Encouraged weight loss       Other Visit Diagnoses    Menometrorrhagia       Relevant Orders   Ambulatory referral to Gynecology   Dysplastic nevus of skin       Relevant Orders   Ambulatory referral to Dermatology       No orders of the defined types were placed in this encounter.  Orders Placed This Encounter  Procedures  . Ambulatory referral to Gynecology    Referral Priority:   Routine    Referral Type:   Consultation    Referral Reason:   Specialty Services Required    Requested Specialty:   Gynecology    Number of Visits Requested:   1  . Ambulatory referral to Dermatology    Referral Priority:   Routine    Referral Type:   Consultation    Referral Reason:   Specialty Services Required    Requested Specialty:   Dermatology    Number of Visits Requested:   1    Follow up plan: Return in about 1 year (around 12/17/2018) for complete physical.  An After Visit Summary was printed and given to the patient.

## 2017-12-16 NOTE — Patient Instructions (Addendum)
Check out the information at familydoctor.org entitled "Nutrition for Weight Loss: What You Need to Know about Fad Diets" Try to lose between 1-2 pounds per week by taking in fewer calories and burning off more calories You can succeed by limiting portions, limiting foods dense in calories and fat, becoming more active, and drinking 8 glasses of water a day (64 ounces) Don't skip meals, especially breakfast, as skipping meals may alter your metabolism Do not use over-the-counter weight loss pills or gimmicks that claim rapid weight loss A healthy BMI (or body mass index) is between 18.5 and 24.9 You can calculate your ideal BMI at the [place] website [employer] We'll have you see the gynecologist and the dermatologist If you have not heard anything from my staff in a week about any orders/referrals/studies from today, please contact us here to follow-up [phone] Health Maintenance, Female Adopting a healthy lifestyle and getting preventive care can go a long way to promote health and wellness. Talk with your health care provider about what schedule of regular examinations is right for you. This is a good chance for you to check in with your provider about disease prevention and staying healthy. In between checkups, there are plenty of things you can do on your own. Experts have done a lot of research about which lifestyle changes and preventive measures are most likely to keep you healthy. Ask your health care provider for more information. Weight and diet Eat a healthy diet  Be sure to include plenty of vegetables, fruits, low-fat dairy products, and lean protein.  Do not eat a lot of foods high in solid fats, added sugars, or salt.  Get regular exercise. This is one of the most important things you can do for your health. ? Most adults should exercise for at least 150 minutes each week. The exercise should increase your heart rate and  make you sweat (moderate-intensity exercise). ? Most adults should also do strengthening exercises at least twice a week. This is in addition to the moderate-intensity exercise.  Maintain a healthy weight  Body mass index (BMI) is a measurement that can be used to identify possible weight problems. It estimates body fat based on height and weight. Your health care provider can help determine your BMI and help you achieve or maintain a healthy weight.  For females 99 years of age and older: ? A BMI below 18.5 is considered underweight. ? A BMI of 18.5 to 24.9 is normal. ? A BMI of 25 to 29.9 is considered overweight. ? A BMI of 30 and above is considered obese.  Watch levels of cholesterol and blood lipids  You should start having your blood tested for lipids and cholesterol at 37 years of age, then have this test every 5 years.  You may need to have your cholesterol levels checked more often if: ? Your lipid or cholesterol levels are high. ? You are older than 36 years of age. ? You are at high risk for heart disease.  Cancer screening Lung Cancer  Lung cancer screening is recommended for adults 61-45 years old who are at high risk for lung cancer because of a history of smoking.  A yearly low-dose CT scan of the lungs is recommended for people who: ? Currently smoke. ? Have quit within the past 15 years. ? Have at least a 30-pack-year history of smoking. A pack year is smoking an average of one pack of cigarettes a day for 1 year.  Yearly screening should continue  until it has been 15 years since you quit.  Yearly screening should stop if you develop a health problem that would prevent you from having lung cancer treatment.  Breast Cancer  Practice breast self-awareness. This means understanding how your breasts normally appear and feel.  It also means doing regular breast self-exams. Let your health care provider know about any changes, no matter how small.  If you are in  your 20s or 30s, you should have a clinical breast exam (CBE) by a health care provider every 1-3 years as part of a regular health exam.  If you are 54 or older, have a CBE every year. Also consider having a breast X-ray (mammogram) every year.  If you have a family history of breast cancer, talk to your health care provider about genetic screening.  If you are at high risk for breast cancer, talk to your health care provider about having an MRI and a mammogram every year.  Breast cancer gene (BRCA) assessment is recommended for women who have family members with BRCA-related cancers. BRCA-related cancers include: ? Breast. ? Ovarian. ? Tubal. ? Peritoneal cancers.  Results of the assessment will determine the need for genetic counseling and BRCA1 and BRCA2 testing.  Cervical Cancer Your health care provider may recommend that you be screened regularly for cancer of the pelvic organs (ovaries, uterus, and vagina). This screening involves a pelvic examination, including checking for microscopic changes to the surface of your cervix (Pap test). You may be encouraged to have this screening done every 3 years, beginning at age 40.  For women ages 63-65, health care providers may recommend pelvic exams and Pap testing every 3 years, or they may recommend the Pap and pelvic exam, combined with testing for human papilloma virus (HPV), every 5 years. Some types of HPV increase your risk of cervical cancer. Testing for HPV may also be done on women of any age with unclear Pap test results.  Other health care providers may not recommend any screening for nonpregnant women who are considered low risk for pelvic cancer and who do not have symptoms. Ask your health care provider if a screening pelvic exam is right for you.  If you have had past treatment for cervical cancer or a condition that could lead to cancer, you need Pap tests and screening for cancer for at least 20 years after your treatment. If  Pap tests have been discontinued, your risk factors (such as having a new sexual partner) need to be reassessed to determine if screening should resume. Some women have medical problems that increase the chance of getting cervical cancer. In these cases, your health care provider may recommend more frequent screening and Pap tests.  Colorectal Cancer  This type of cancer can be detected and often prevented.  Routine colorectal cancer screening usually begins at 36 years of age and continues through 36 years of age.  Your health care provider may recommend screening at an earlier age if you have risk factors for colon cancer.  Your health care provider may also recommend using home test kits to check for hidden blood in the stool.  A small camera at the end of a tube can be used to examine your colon directly (sigmoidoscopy or colonoscopy). This is done to check for the earliest forms of colorectal cancer.  Routine screening usually begins at age 74.  Direct examination of the colon should be repeated every 5-10 years through 36 years of age. However, you may need  to be screened more often if early forms of precancerous polyps or small growths are found.  Skin Cancer  Check your skin from head to toe regularly.  Tell your health care provider about any new moles or changes in moles, especially if there is a change in a mole's shape or color.  Also tell your health care provider if you have a mole that is larger than the size of a pencil eraser.  Always use sunscreen. Apply sunscreen liberally and repeatedly throughout the day.  Protect yourself by wearing long sleeves, pants, a wide-brimmed hat, and sunglasses whenever you are outside.  Heart disease, diabetes, and high blood pressure  High blood pressure causes heart disease and increases the risk of stroke. High blood pressure is more likely to develop in: ? People who have blood pressure in the high end of the normal range  (130-139/85-89 mm Hg). ? People who are overweight or obese. ? People who are African American.  If you are 36-59 years of age, have your blood pressure checked every 3-5 years. If you are 61 years of age or older, have your blood pressure checked every year. You should have your blood pressure measured twice-once when you are at a hospital or clinic, and once when you are not at a hospital or clinic. Record the average of the two measurements. To check your blood pressure when you are not at a hospital or clinic, you can use: ? An automated blood pressure machine at a pharmacy. ? A home blood pressure monitor.  If you are between 69 years and 80 years old, ask your health care provider if you should take aspirin to prevent strokes.  Have regular diabetes screenings. This involves taking a blood sample to check your fasting blood sugar level. ? If you are at a normal weight and have a low risk for diabetes, have this test once every three years after 36 years of age. ? If you are overweight and have a high risk for diabetes, consider being tested at a younger age or more often. Preventing infection Hepatitis B  If you have a higher risk for hepatitis B, you should be screened for this virus. You are considered at high risk for hepatitis B if: ? You were born in a country where hepatitis B is common. Ask your health care provider which countries are considered high risk. ? Your parents were born in a high-risk country, and you have not been immunized against hepatitis B (hepatitis B vaccine). ? You have HIV or AIDS. ? You use needles to inject street drugs. ? You live with someone who has hepatitis B. ? You have had sex with someone who has hepatitis B. ? You get hemodialysis treatment. ? You take certain medicines for conditions, including cancer, organ transplantation, and autoimmune conditions.  Hepatitis C  Blood testing is recommended for: ? Everyone born from [birth year] through  1964. ? Anyone with known risk factors for hepatitis C.  Sexually transmitted infections (STIs)  You should be screened for sexually transmitted infections (STIs) including gonorrhea and chlamydia if: ? You are sexually active and are younger than 36 years of age. ? You are older than 36 years of age and your health care provider tells you that you are at risk for this type of infection. ? Your sexual activity has changed since you were last screened and you are at an increased risk for chlamydia or gonorrhea. Ask your health care provider if you are at risk.  If you do not have HIV, but are at risk, it may be recommended that you take a prescription medicine daily to prevent HIV infection. This is called pre-exposure prophylaxis (PrEP). You are considered at risk if: ? You are sexually active and do not regularly use condoms or know the HIV status of your partner(s). ? You take drugs by injection. ? You are sexually active with a partner who has HIV.  Talk with your health care provider about whether you are at high risk of being infected with HIV. If you choose to begin PrEP, you should first be tested for HIV. You should then be tested every 3 months for as long as you are taking PrEP. Pregnancy  If you are premenopausal and you may become pregnant, ask your health care provider about preconception counseling.  If you may become pregnant, take 400 to 800 micrograms (mcg) of folic acid every day.  If you want to prevent pregnancy, talk to your health care provider about birth control (contraception). Osteoporosis and menopause  Osteoporosis is a disease in which the bones lose minerals and strength with aging. This can result in serious bone fractures. Your risk for osteoporosis can be identified using a bone density scan.  If you are 30 years of age or older, or if you are at risk for osteoporosis and fractures, ask your health care provider if you should be screened.  Ask your health  care provider whether you should take a calcium or vitamin D supplement to lower your risk for osteoporosis.  Menopause may have certain physical symptoms and risks.  Hormone replacement therapy may reduce some of these symptoms and risks. Talk to your health care provider about whether hormone replacement therapy is right for you. Follow these instructions at home:  Schedule regular health, dental, and eye exams.  Stay current with your immunizations.  Do not use any tobacco products including cigarettes, chewing tobacco, or electronic cigarettes.  If you are pregnant, do not drink alcohol.  If you are breastfeeding, limit how much and how often you drink alcohol.  Limit alcohol intake to no more than 1 drink per day for nonpregnant women. One drink equals 12 ounces of beer, 5 ounces of wine, or 1 ounces of hard liquor.  Do not use street drugs.  Do not share needles.  Ask your health care provider for help if you need support or information about quitting drugs.  Tell your health care provider if you often feel depressed.  Tell your health care provider if you have ever been abused or do not feel safe at home. This information is not intended to replace advice given to you by your health care provider. Make sure you discuss any questions you have with your health care provider. Document Released: 01/08/2011 Document Revised: 12/01/2015 Document Reviewed: 03/29/2015 Elsevier Interactive Patient Education  [employer].

## 2017-12-16 NOTE — Assessment & Plan Note (Signed)
USPSTF grade A and B recommendations reviewed with patient; age-appropriate recommendations, preventive care, screening tests, etc discussed and encouraged; healthy living encouraged; see AVS for patient education given to patient  

## 2017-12-17 ENCOUNTER — Ambulatory Visit (INDEPENDENT_AMBULATORY_CARE_PROVIDER_SITE_OTHER): Payer: No Typology Code available for payment source | Admitting: Family Medicine

## 2017-12-17 ENCOUNTER — Encounter (INDEPENDENT_AMBULATORY_CARE_PROVIDER_SITE_OTHER): Payer: Self-pay

## 2017-12-17 VITALS — BP 101/67 | HR 69 | Temp 98.5°F | Ht 64.0 in | Wt 198.0 lb

## 2017-12-17 DIAGNOSIS — E559 Vitamin D deficiency, unspecified: Secondary | ICD-10-CM | POA: Diagnosis not present

## 2017-12-17 DIAGNOSIS — Z9189 Other specified personal risk factors, not elsewhere classified: Secondary | ICD-10-CM

## 2017-12-17 DIAGNOSIS — E8881 Metabolic syndrome: Secondary | ICD-10-CM

## 2017-12-17 DIAGNOSIS — Z6835 Body mass index (BMI) 35.0-35.9, adult: Secondary | ICD-10-CM

## 2017-12-17 MED ORDER — VITAMIN D (ERGOCALCIFEROL) 1.25 MG (50000 UNIT) PO CAPS: 50000.0000 [IU] | ORAL_CAPSULE | ORAL | 0 refills | Status: DC

## 2017-12-17 MED ORDER — METFORMIN HCL 500 MG PO TABS: 500.0000 mg | ORAL_TABLET | Freq: Every day | ORAL | 0 refills | Status: DC

## 2017-12-17 NOTE — Progress Notes (Signed)
Office: [phone]  /  Fax: [phone]   HPI:   Chief Complaint: OBESITY Leah Brown is here to discuss her progress with her obesity treatment plan. She is on the Category 3 plan and is following her eating plan approximately 25 % of the time. She states she is exercising 0 minutes 0 times per week. [name] continues to do well with weight loss, but she recently started oral contraceptive pills and has had heavy bleeding. Her weight is 198 lb (89.8 kg) today and has had a weight loss of 2 pounds over a period of 3 weeks since her last visit. She has lost 7 lbs since starting treatment with [place].  Vitamin D deficiency [name] has a diagnosis of vitamin D deficiency. [name] is stable on vit D, but she is not yet at goal. She still notes fatigue and denies nausea, vomiting or muscle weakness. Labs are otherwise within normal limits. She has a normal B12 level.  At risk for osteopenia and osteoporosis [name] is at higher risk of osteopenia and osteoporosis due to vitamin D deficiency.   Pre-Diabetes [name] has a diagnosis of prediabetes based on her elevated Hgb A1c and was informed this puts her at greater risk of developing diabetes. She is doing well on metformin and diet prescription. [name] continues to work on diet and exercise to decrease risk of diabetes. She denies nausea or hypoglycemia.    ALLERGIES: Allergies  Allergen Reactions  . Almond (Diagnostic) Hives and Swelling  . Celery Oil Swelling  . Codeine Nausea Only  . Peanut-Containing Drug Products Other (See Comments)    Allergy testing   . Pollen Extract Swelling  . Tree Extract Hives and Swelling    MEDICATIONS: Current Outpatient Medications on File Prior to Visit  Medication Sig Dispense Refill  . caffeine 200 MG TABS tablet Take 0.5 tablets (100 mg total) by mouth daily as needed.    . cetirizine (ZYRTEC) 10 MG tablet Take 10 mg by mouth as needed.     . citalopram (CELEXA) 10 MG tablet Take 1 tablet (10 mg total) by  mouth daily. 90 tablet 3  . Multiple Vitamin (MULTIVITAMIN WITH MINERALS) TABS tablet Take 1 tablet by mouth daily.    . Norgestimate-Ethinyl Estradiol Triphasic (ORTHO TRI-CYCLEN LO) 0.18/0.215/0.25 MG-25 MCG tab Take 1 tablet by mouth daily. 1 Package 11   No current facility-administered medications on file prior to visit.     PAST MEDICAL HISTORY: Past Medical History:  Diagnosis Date  . Alcohol abuse   . Amenorrhea 2006   Ongoing since 2006  . Anxiety   . Depression   . Food allergy   . GERD (gastroesophageal reflux disease)   . High cholesterol   . Hypertension   . Infertility, female   . Insulin resistance   . Joint pain   . Multiple food allergies   . Obesity   . Ophthalmic migraine 2006  . Skin cancer   . Snores 12/02/2015  . Vitamin D deficiency     PAST SURGICAL HISTORY: Past Surgical History:  Procedure Laterality Date  . SKIN CANCER DESTRUCTION Left 2008   Breast  . WISDOM TOOTH EXTRACTION      SOCIAL HISTORY: Social History   Tobacco Use  . Smoking status: Former Smoker    Packs/day: 0.50    Years: 4.00    Pack years: 2.00    Types: Cigarettes    Start date: 07/10/1999    Last attempt to quit: 07/10/2003    Years since  quitting: 14.4  . Smokeless tobacco: Never Used  Substance Use Topics  . Alcohol use: Yes    Alcohol/week: 1.2 oz    Types: 1 Glasses of wine, 1 Cans of beer per week    Comment: socially  . Drug use: No    FAMILY HISTORY: Family History  Problem Relation Age of Onset  . Hypertension Father   . Diabetes Father   . Obesity Father   . Hyperlipidemia Father   . Appendicitis Mother 63  . Ovarian cancer Maternal Aunt   . Gallstones Maternal Aunt   . Ovarian cancer Maternal Grandmother   . Breast cancer Neg Hx     ROS: Review of Systems  Constitutional: Positive for malaise/fatigue and weight loss.  Gastrointestinal: Negative for nausea and vomiting.  Musculoskeletal:       Negative for muscle weakness    Endo/Heme/Allergies:       Negative for hypoglycemia    PHYSICAL EXAM: Blood pressure 101/67, pulse 69, temperature 98.5 F (36.9 C), temperature source Oral, height 5\' 4"  (1.626 m), weight 198 lb (89.8 kg), last menstrual period 12/13/2017, SpO2 99 %. Body mass index is 33.99 kg/m. Physical Exam  Constitutional: She is oriented to person, place, and time. She appears well-developed and well-nourished.  Cardiovascular: Normal rate.  Pulmonary/Chest: Effort normal.  Musculoskeletal: Normal range of motion.  Neurological: She is oriented to person, place, and time.  Skin: Skin is warm and dry.  Psychiatric: She has a normal mood and affect. Her behavior is normal.  Vitals reviewed.   RECENT LABS AND TESTS: BMET    Component Value Date/Time   NA 140 10/31/2017 0919   NA 138 06/11/2013 1014   K 4.3 10/31/2017 0919   K 3.8 06/11/2013 1014   CL 102 10/31/2017 0919   CL 102 06/11/2013 1014   CO2 24 10/31/2017 0919   CO2 27 06/11/2013 1014   GLUCOSE 96 10/31/2017 0919   GLUCOSE 96 06/25/2017 1205   GLUCOSE 96 06/11/2013 1014   BUN 11 10/31/2017 0919   BUN 11 06/11/2013 1014   CREATININE 0.86 10/31/2017 0919   CREATININE 0.72 06/25/2017 1205   CALCIUM 9.4 10/31/2017 0919   CALCIUM 8.7 06/11/2013 1014   GFRNONAA 87 10/31/2017 0919   GFRNONAA 108 06/25/2017 1205   GFRAA 101 10/31/2017 0919   GFRAA 126 06/25/2017 1205   Lab Results  Component Value Date   HGBA1C 5.2 10/31/2017   Lab Results  Component Value Date   INSULIN 20.2 10/31/2017   CBC    Component Value Date/Time   WBC 8.8 10/31/2017 0919   WBC 8.4 06/25/2017 1205   RBC 4.58 10/31/2017 0919   RBC 4.51 06/25/2017 1205   HGB 13.8 10/31/2017 0919   HCT 42.2 10/31/2017 0919   PLT 259 06/25/2017 1205   PLT 296 12/09/2015 0830   MCV 92 10/31/2017 0919   MCV 91 06/11/2013 1014   MCH 30.1 10/31/2017 0919   MCH 30.4 06/25/2017 1205   MCHC 32.7 10/31/2017 0919   MCHC 33.7 06/25/2017 1205   RDW 13.7  10/31/2017 0919   RDW 12.5 06/11/2013 1014   LYMPHSABS 3.5 (H) 10/31/2017 0919   LYMPHSABS 2.6 06/11/2013 1014   MONOABS 560 12/13/2016 0902   MONOABS 0.4 06/11/2013 1014   EOSABS 0.2 10/31/2017 0919   EOSABS 0.1 06/11/2013 1014   BASOSABS 0.1 10/31/2017 0919   BASOSABS 0.0 06/11/2013 1014   Iron/TIBC/Ferritin/ %Sat No results found for: IRON, TIBC, FERRITIN, IRONPCTSAT Lipid Panel  Component Value Date/Time   CHOL 205 (H) 10/31/2017 0919   CHOL 175 06/11/2013 1014   TRIG 99 10/31/2017 0919   TRIG 52 06/11/2013 1014   HDL 53 10/31/2017 0919   HDL 42 06/11/2013 1014   CHOLHDL 3.5 12/13/2016 0902   VLDL 13 12/13/2016 0902   VLDL 10 06/11/2013 1014   LDLCALC 132 (H) 10/31/2017 0919   LDLCALC 123 (H) 06/11/2013 1014   Hepatic Function Panel     Component Value Date/Time   PROT 7.2 10/31/2017 0919   PROT 7.3 06/11/2013 1014   ALBUMIN 4.3 10/31/2017 0919   ALBUMIN 3.9 06/11/2013 1014   AST 20 10/31/2017 0919   AST 13 (L) 06/11/2013 1014   ALT 20 10/31/2017 0919   ALT 21 06/11/2013 1014   ALKPHOS 73 10/31/2017 0919   ALKPHOS 52 06/11/2013 1014   BILITOT 0.3 10/31/2017 0919   BILITOT 0.4 06/11/2013 1014      Component Value Date/Time   TSH 1.730 10/31/2017 0919   TSH 1.83 12/13/2016 0902   TSH 1.170 12/09/2015 0830   Results for SHAQUAYLA, KLIMAS (MRN [id number]) as of 12/17/2017 16:35  Ref. Range 10/31/2017 09:19  Vitamin D, 25-Hydroxy Latest Ref Range: 30.0 - 100.0 ng/mL 28.3 (L)   ASSESSMENT AND PLAN: Vitamin D deficiency - Plan: Vitamin D, Ergocalciferol, (DRISDOL) 50000 units CAPS capsule  Insulin resistance - Plan: metFORMIN (GLUCOPHAGE) 500 MG tablet  At risk for osteoporosis  Class 2 severe obesity with serious comorbidity and body mass index (BMI) of 35.0 to 35.9 in adult, unspecified obesity type ([place])  PLAN:  Vitamin D Deficiency Kaislee was informed that low vitamin D levels contributes to fatigue and are associated with obesity, breast, and  colon cancer. She agrees to continue to take prescription Vit D @50 ,000 IU every week #4 with no refills and will follow up for routine testing of vitamin D, at least 2-3 times per year. She was informed of the risk of over-replacement of vitamin D and agrees to not increase her dose unless she discusses this with [place] first.  At risk for osteopenia and osteoporosis Deijah is at risk for osteopenia and osteoporosis due to her vitamin D deficiency. She was encouraged to take her vitamin D and follow her higher calcium diet and increase strengthening exercise to help strengthen her bones and decrease her risk of osteopenia and osteoporosis.  Pre-Diabetes Iridessa will continue to work on weight loss, exercise, and decreasing simple carbohydrates in her diet to help decrease the risk of diabetes. We dicussed metformin including benefits and risks. She was informed that eating too many simple carbohydrates or too many calories at one sitting increases the likelihood of GI side effects. Kirti requested metformin for now and a prescription was written today for 1 month refill. Rosia agreed to follow up with [place] as directed to monitor her progress.  Obesity Anjel is currently in the action stage of change. As such, her goal is to continue with weight loss efforts She has agreed to follow the Category 3 plan Raneisha has been instructed to work up to a goal of 150 minutes of combined cardio and strengthening exercise per week for weight loss and overall health benefits. We discussed the following Behavioral Modification Strategies today: increasing lean protein intake, decreasing simple carbohydrates  and work on meal planning and easy cooking plans  Symia has agreed to follow up with our clinic in 2 to 3 weeks. She was informed of the importance of frequent follow up visits  to maximize her success with intensive lifestyle modifications for her multiple health conditions.   OBESITY BEHAVIORAL INTERVENTION  VISIT  Today's visit was # 4 out of 22.  Starting weight: 205 lbs Starting date: 10/31/17 Today's weight : 198 lbs  Today's date: 12/17/2017 Total lbs lost to date: 7 (Patients must lose 7 lbs in the first 6 months to continue with counseling)   ASK: We discussed the diagnosis of obesity with Maebry Jetta Lout today and Tiffnay agreed to give [place] permission to discuss obesity behavioral modification therapy today.  ASSESS: Darlys has the diagnosis of obesity and her BMI today is 33.97 Reha is in the action stage of change   ADVISE: Finnleigh was educated on the multiple health risks of obesity as well as the benefit of weight loss to improve her health. She was advised of the need for long term treatment and the importance of lifestyle modifications.  AGREE: Multiple dietary modification options and treatment options were discussed and  Aja agreed to the above obesity treatment plan.  I, Doreene Nest, am acting as transcriptionist for Dennard Nip, MD  I have reviewed the above documentation for accuracy and completeness, and I agree with the above. -Dennard Nip, MD

## 2018-01-01 ENCOUNTER — Encounter: Payer: Self-pay | Admitting: Obstetrics and Gynecology

## 2018-01-01 ENCOUNTER — Ambulatory Visit: Payer: No Typology Code available for payment source | Admitting: Obstetrics and Gynecology

## 2018-01-01 DIAGNOSIS — N938 Other specified abnormal uterine and vaginal bleeding: Secondary | ICD-10-CM | POA: Insufficient documentation

## 2018-01-01 NOTE — Progress Notes (Signed)
NGYN patient presents for problem visit today. Per pt not Annual Exam.   Pt states she has recently started metformin and birth control pills   Pt states she has had heavy bleeding, severe cramping, nausea, back pain  last period pain was 10/10x Per pt in the past periods were never painful.   Family history of ovarian cancer (MGM) (Maternal Aunt) PCP : [facility] in [place] Dr. Jake Church.   LMP: 12/14/2017  Last pap: 12/06/2015  WNL no history of abnormal paps

## 2018-01-01 NOTE — Patient Instructions (Signed)
Diet for Polycystic Ovarian Syndrome Polycystic ovary syndrome (PCOS) is a disorder of the chemical messengers (hormones) that regulate menstruation. The condition causes important hormones to be out of balance. PCOS can:  Make your periods irregular or stop.  Cause cysts to develop on the ovaries.  Make it difficult to get pregnant.  Stop your body from responding to the effects of insulin (insulin resistance), which can lead to obesity and diabetes.  Changing what you eat can help manage PCOS and improve your health. It can help you lose weight and improve the way your body uses insulin. What is my plan?  Eat breakfast, lunch, and dinner plus two snacks every day.  Include protein in each meal and snack.  Choose whole grains instead of products made with refined flour.  Eat a variety of foods.  Exercise regularly as told by your health care provider. What do I need to know about this eating plan? If you are overweight or obese, pay attention to how many calories you eat. Cutting down on calories can help you lose weight. Work with your health care provider or dietitian to figure out how many calories you need each day. What foods can I eat? Grains Whole grains, such as whole wheat. Whole-grain breads, crackers, cereals, and pasta. Unsweetened oatmeal, bulgur, barley, quinoa, or brown rice. Corn or whole-wheat flour tortillas. Vegetables  Lettuce. Spinach. Peas. Beets. Cauliflower. Cabbage. Broccoli. Carrots. Tomatoes. Squash. Eggplant. Herbs. Peppers. Onions. Cucumbers. Brussels sprouts. Fruits Berries. Bananas. Apples. Oranges. Grapes. Papaya. Mango. Pomegranate. Kiwi. Grapefruit. Cherries. Meats and Other Protein Sources Lean proteins, such as fish, chicken, beans, eggs, and tofu. Dairy Low-fat dairy products, such as skim milk, cheese sticks, and yogurt. Beverages Low-fat or fat-free drinks, such as water, low-fat milk, sugar-free drinks, and 100% fruit  juice. Condiments Ketchup. Mustard. Barbecue sauce. Relish. Low-fat or fat-free mayonnaise. Fats and Oils Olive oil or canola oil. Walnuts and almonds. The items listed above may not be a complete list of recommended foods or beverages. Contact your dietitian for more options. What foods are not recommended? Foods high in calories or fat. Fried foods. Sweets. Products made from refined white flour, including white bread, pastries, white rice, and pasta. The items listed above may not be a complete list of foods and beverages to avoid. Contact your dietitian for more information. This information is not intended to replace advice given to you by your health care provider. Make sure you discuss any questions you have with your health care provider. Document Released: 10/17/2015 Document Revised: 12/01/2015 Document Reviewed: 07/07/2014 Elsevier Interactive Patient Education  2018 [employer].

## 2018-01-02 ENCOUNTER — Ambulatory Visit (INDEPENDENT_AMBULATORY_CARE_PROVIDER_SITE_OTHER): Payer: No Typology Code available for payment source | Admitting: Family Medicine

## 2018-01-02 VITALS — BP 96/63 | HR 67 | Temp 98.1°F | Ht 64.0 in | Wt 197.0 lb

## 2018-01-02 DIAGNOSIS — Z9189 Other specified personal risk factors, not elsewhere classified: Secondary | ICD-10-CM | POA: Diagnosis not present

## 2018-01-02 DIAGNOSIS — E559 Vitamin D deficiency, unspecified: Secondary | ICD-10-CM

## 2018-01-02 DIAGNOSIS — R110 Nausea: Secondary | ICD-10-CM | POA: Diagnosis not present

## 2018-01-02 DIAGNOSIS — Z6833 Body mass index (BMI) 33.0-33.9, adult: Secondary | ICD-10-CM | POA: Diagnosis not present

## 2018-01-02 DIAGNOSIS — R7303 Prediabetes: Secondary | ICD-10-CM

## 2018-01-02 DIAGNOSIS — E669 Obesity, unspecified: Secondary | ICD-10-CM

## 2018-01-02 MED ORDER — ONDANSETRON HCL 4 MG PO TABS: 4.0000 mg | ORAL_TABLET | Freq: Four times a day (QID) | ORAL | 0 refills | Status: DC

## 2018-01-02 MED FILL — ONDANSETRON HCL 4 MG TABLET: 4 | 5 days supply | Qty: 20 | Fill #0

## 2018-01-06 NOTE — Progress Notes (Signed)
Office: [phone]  /  Fax: [phone]   HPI:   Chief Complaint: OBESITY Leah Brown is here to discuss her progress with her obesity treatment plan. She is on the Category 3 plan and is following her eating plan approximately 25 % of the time. She states she is exercising 0 minutes 0 times per week. Leah Brown was recently ill with GI virus and was unable to follow the category 3 plan. Diarrhea has resolved, but patient reports continuous intermittent nausea. She has been attempting to hydrate and is eating small amounts throughout the day. She is working on getting back on track as tolerated. Her weight is 197 lb (89.4 kg) today and has had a weight loss of 1 pound over a period of 2 weeks since her last visit. She has lost 8 lbs since starting treatment with [place].  Vitamin D deficiency Leah Brown has a diagnosis of vitamin D deficiency. She is currently taking prescription vit D. Her last level was not at foal. Leah Brown  denies muscle weakness.  At risk for osteopenia and osteoporosis Leah Brown is at higher risk of osteopenia and osteoporosis due to vitamin D deficiency.   Pre-Diabetes Leah Brown has a diagnosis of prediabetes based on her elevated Hgb A1c and was informed this puts her at greater risk of developing diabetes. She is taking metformin currently and continues to work on diet and exercise to decrease risk of diabetes. She has had nausea recently and denies hypoglycemia.  Nausea Leah Brown was ill recently with a GI virus. Diarrhea has resolved. She continues to complain of intermittent nausea.  ALLERGIES: Allergies  Allergen Reactions  . Almond (Diagnostic) Hives and Swelling  . Celery Oil Swelling  . Codeine Nausea Only  . Peanut-Containing Drug Products Other (See Comments)    Allergy testing   . Pollen Extract Swelling  . Tree Extract Hives and Swelling    MEDICATIONS: Current Outpatient Medications on File Prior to Visit  Medication Sig Dispense Refill  . caffeine 200 MG TABS tablet  Take 0.5 tablets (100 mg total) by mouth daily as needed.    . cetirizine (ZYRTEC) 10 MG tablet Take 10 mg by mouth as needed.     . citalopram (CELEXA) 10 MG tablet Take 1 tablet (10 mg total) by mouth daily. 90 tablet 3  . metFORMIN (GLUCOPHAGE) 500 MG tablet Take 1 tablet (500 mg total) by mouth daily with breakfast. 30 tablet 0  . Multiple Vitamin (MULTIVITAMIN WITH MINERALS) TABS tablet Take 1 tablet by mouth daily.    . Norgestimate-Ethinyl Estradiol Triphasic (ORTHO TRI-CYCLEN LO) 0.18/0.215/0.25 MG-25 MCG tab Take 1 tablet by mouth daily. 1 Package 11  . Vitamin D, Ergocalciferol, (DRISDOL) 50000 units CAPS capsule Take 1 capsule (50,000 Units total) by mouth every 7 (seven) days. 4 capsule 0   No current facility-administered medications on file prior to visit.     PAST MEDICAL HISTORY: Past Medical History:  Diagnosis Date  . Alcohol abuse   . Amenorrhea 2006   Ongoing since 2006  . Anxiety   . Depression   . Food allergy   . GERD (gastroesophageal reflux disease)   . High cholesterol   . Hypertension   . Infertility, female   . Insulin resistance   . Joint pain   . Multiple food allergies   . Obesity   . Ophthalmic migraine 2006  . Skin cancer   . Snores 12/02/2015  . Vitamin D deficiency     PAST SURGICAL HISTORY: Past Surgical History:  Procedure Laterality  Date  . SKIN CANCER DESTRUCTION Left 2008   Breast  . WISDOM TOOTH EXTRACTION      SOCIAL HISTORY: Social History   Tobacco Use  . Smoking status: Former Smoker    Packs/day: 0.50    Years: 4.00    Pack years: 2.00    Types: Cigarettes    Start date: 07/10/1999    Last attempt to quit: 07/10/2003    Years since quitting: 14.5  . Smokeless tobacco: Never Used  Substance Use Topics  . Alcohol use: Yes    Alcohol/week: 1.2 oz    Types: 1 Glasses of wine, 1 Cans of beer per week    Comment: socially  . Drug use: No    FAMILY HISTORY: Family History  Problem Relation Age of Onset  . Hypertension  Father   . Diabetes Father   . Obesity Father   . Hyperlipidemia Father   . Appendicitis Mother 47  . Ovarian cancer Maternal Aunt   . Gallstones Maternal Aunt   . Ovarian cancer Maternal Grandmother   . Breast cancer Neg Hx     ROS: Review of Systems  Constitutional: Positive for weight loss.  Gastrointestinal: Positive for nausea. Negative for diarrhea.  Musculoskeletal:       Negative for muscle weakness  Endo/Heme/Allergies:       Negative for hypoglycemia    PHYSICAL EXAM: Blood pressure 96/63, pulse 67, temperature 98.1 F (36.7 C), temperature source Oral, height 5\' 4"  (1.626 m), weight 197 lb (89.4 kg), last menstrual period 12/14/2017, SpO2 98 %. Body mass index is 33.81 kg/m. Physical Exam  Constitutional: She is oriented to person, place, and time. She appears well-developed and well-nourished.  Cardiovascular: Normal rate.  Pulmonary/Chest: Effort normal.  Musculoskeletal: Normal range of motion.  Neurological: She is oriented to person, place, and time.  Skin: Skin is warm and dry.  Psychiatric: She has a normal mood and affect. Her behavior is normal.  Vitals reviewed.   RECENT LABS AND TESTS: BMET    Component Value Date/Time   NA 140 10/31/2017 0919   NA 138 06/11/2013 1014   K 4.3 10/31/2017 0919   K 3.8 06/11/2013 1014   CL 102 10/31/2017 0919   CL 102 06/11/2013 1014   CO2 24 10/31/2017 0919   CO2 27 06/11/2013 1014   GLUCOSE 96 10/31/2017 0919   GLUCOSE 96 06/25/2017 1205   GLUCOSE 96 06/11/2013 1014   BUN 11 10/31/2017 0919   BUN 11 06/11/2013 1014   CREATININE 0.86 10/31/2017 0919   CREATININE 0.72 06/25/2017 1205   CALCIUM 9.4 10/31/2017 0919   CALCIUM 8.7 06/11/2013 1014   GFRNONAA 87 10/31/2017 0919   GFRNONAA 108 06/25/2017 1205   GFRAA 101 10/31/2017 0919   GFRAA 126 06/25/2017 1205   Lab Results  Component Value Date   HGBA1C 5.2 10/31/2017   Lab Results  Component Value Date   INSULIN 20.2 10/31/2017   CBC      Component Value Date/Time   WBC 8.8 10/31/2017 0919   WBC 8.4 06/25/2017 1205   RBC 4.58 10/31/2017 0919   RBC 4.51 06/25/2017 1205   HGB 13.8 10/31/2017 0919   HCT 42.2 10/31/2017 0919   PLT 259 06/25/2017 1205   PLT 296 12/09/2015 0830   MCV 92 10/31/2017 0919   MCV 91 06/11/2013 1014   MCH 30.1 10/31/2017 0919   MCH 30.4 06/25/2017 1205   MCHC 32.7 10/31/2017 0919   MCHC 33.7 06/25/2017 1205   RDW  13.7 10/31/2017 0919   RDW 12.5 06/11/2013 1014   LYMPHSABS 3.5 (H) 10/31/2017 0919   LYMPHSABS 2.6 06/11/2013 1014   MONOABS 560 12/13/2016 0902   MONOABS 0.4 06/11/2013 1014   EOSABS 0.2 10/31/2017 0919   EOSABS 0.1 06/11/2013 1014   BASOSABS 0.1 10/31/2017 0919   BASOSABS 0.0 06/11/2013 1014   Iron/TIBC/Ferritin/ %Sat No results found for: IRON, TIBC, FERRITIN, IRONPCTSAT Lipid Panel     Component Value Date/Time   CHOL 205 (H) 10/31/2017 0919   CHOL 175 06/11/2013 1014   TRIG 99 10/31/2017 0919   TRIG 52 06/11/2013 1014   HDL 53 10/31/2017 0919   HDL 42 06/11/2013 1014   CHOLHDL 3.5 12/13/2016 0902   VLDL 13 12/13/2016 0902   VLDL 10 06/11/2013 1014   LDLCALC 132 (H) 10/31/2017 0919   LDLCALC 123 (H) 06/11/2013 1014   Hepatic Function Panel     Component Value Date/Time   PROT 7.2 10/31/2017 0919   PROT 7.3 06/11/2013 1014   ALBUMIN 4.3 10/31/2017 0919   ALBUMIN 3.9 06/11/2013 1014   AST 20 10/31/2017 0919   AST 13 (L) 06/11/2013 1014   ALT 20 10/31/2017 0919   ALT 21 06/11/2013 1014   ALKPHOS 73 10/31/2017 0919   ALKPHOS 52 06/11/2013 1014   BILITOT 0.3 10/31/2017 0919   BILITOT 0.4 06/11/2013 1014      Component Value Date/Time   TSH 1.730 10/31/2017 0919   TSH 1.83 12/13/2016 0902   TSH 1.170 12/09/2015 0830   Results for LINSY, EHRESMAN (MRN [id number]) as of 01/06/2018 08:59  Ref. Range 10/31/2017 09:19  Vitamin D, 25-Hydroxy Latest Ref Range: 30.0 - 100.0 ng/mL 28.3 (L)   ASSESSMENT AND PLAN: Nausea - Plan: ondansetron (ZOFRAN) 4 MG  tablet  Prediabetes  Vitamin D deficiency  At risk for osteoporosis  Class 1 obesity with serious comorbidity and body mass index (BMI) of 33.0 to 33.9 in adult, unspecified obesity type  PLAN:  Vitamin D Deficiency Jahnyla was informed that low vitamin D levels contributes to fatigue and are associated with obesity, breast, and colon cancer. She agrees to continue to take prescription Vit D @50 ,000 IU every week. We will recheck labs next month and she will follow up for routine testing of vitamin D, at least 2-3 times per year. She was informed of the risk of over-replacement of vitamin D and agrees to not increase her dose unless she discusses this with [place] first.  At risk for osteopenia and osteoporosis Leah Brown is at risk for osteopenia and osteoporosis due to her vitamin D deficiency. She was encouraged to take her vitamin D and follow her higher calcium diet and increase strengthening exercise to help strengthen her bones and decrease her risk of osteopenia and osteoporosis.  Pre-Diabetes Leah Brown will continue to work on weight loss, exercise, and decreasing simple carbohydrates in her diet to help decrease the risk of diabetes. We dicussed stopping metformin until nausea resolves and will recheck labs next month. She was informed that eating too many simple carbohydrates or too many calories at one sitting increases the likelihood of GI side effects. Leah Brown agreed to follow up with [place] as directed to monitor her progress.  Nausea Leah Brown agrees to start Zofran 4 mg one tablet every 6 hours as needed for nausea #20 with no refills and follow up as directed.  Obesity Leah Brown is currently in the action stage of change. As such, her goal is to continue with weight loss efforts She has  agreed to follow the Category 3 plan Leah Brown has been instructed to work up to a goal of 150 minutes of combined cardio and strengthening exercise per week for weight loss and overall health benefits. We discussed the  following Behavioral Modification Strategies today: increasing lean protein intake and increase H2O intake  Leah Brown has agreed to follow up with our clinic in 2 to 3 weeks. She was informed of the importance of frequent follow up visits to maximize her success with intensive lifestyle modifications for her multiple health conditions.   OBESITY BEHAVIORAL INTERVENTION VISIT  Today's visit was # 5 out of 22.  Starting weight: 205 lbs Starting date: 10/31/17 Today's weight : 197 lbs Today's date: 01/02/18 Total lbs lost to date: 8 (Patients must lose 7 lbs in the first 6 months to continue with counseling)   ASK: We discussed the diagnosis of obesity with Leah Brown Leah Brown today and Leah Brown agreed to give [place] permission to discuss obesity behavioral modification therapy today.  ASSESS: Leah Brown has the diagnosis of obesity and her BMI today is 33.8 Leah Brown is in the action stage of change   ADVISE: Leah Brown was educated on the multiple health risks of obesity as well as the benefit of weight loss to improve her health. She was advised of the need for long term treatment and the importance of lifestyle modifications.  AGREE: Multiple dietary modification options and treatment options were discussed and  Yeilyn agreed to the above obesity treatment plan.  I, Leah Brown, am acting as transcriptionist for Dennard Nip, MD  I have reviewed the above documentation for accuracy and completeness, and I agree with the above. -Dennard Nip, MD

## 2018-01-06 NOTE — Progress Notes (Signed)
Patient ID: Leah Brown, female   DOB: February 16, 1981, 37 y.o.   MRN: [id number] Ms Borromeo presents for evaluation of AUB. Pt has a H/O irregular cycles. She will skip months and has gone a yr without a cycle.  Her PCP started her on Triphasil OCP's this past May. Pt started with no relationship to her cycle. She had 2 episodes of bleeding with the first package.  She is also on Metformin for glucose intolerance.  Sexual active without problems Last pap 11/2017, normal. No H/O STD Nulligravida FH of ovarian cancer in [facility] and [place]  PE AF VSS Lungs clear Heart RRR Abd soft + BS GYN Nl EGBUS uterus small mobile no masses  A/P AUB        F/H of ovarian cancer Suspect AUB related to recent start of OCP's. Pt reassured. Will check GYN U/S. May be a component of PCOS. Information for genetic testing provided to pt. She will check coverage with insurance and return here for blood draw or with PCP. Will follow up in 3-4 months.

## 2018-01-07 MED FILL — VIT D2 1.25 MG (50,000 UNIT: 1.25 MG | 28 days supply | Qty: 4 | Fill #0

## 2018-01-07 MED FILL — metFORMIN HCL 500 MG TABS: 500 | 30 days supply | Qty: 30 | Fill #0

## 2018-01-08 ENCOUNTER — Ambulatory Visit (HOSPITAL_COMMUNITY): Payer: No Typology Code available for payment source

## 2018-01-23 ENCOUNTER — Ambulatory Visit (INDEPENDENT_AMBULATORY_CARE_PROVIDER_SITE_OTHER): Payer: No Typology Code available for payment source | Admitting: Family Medicine

## 2018-01-23 VITALS — BP 99/65 | HR 60 | Temp 98.1°F | Ht 64.0 in | Wt 200.0 lb

## 2018-01-23 DIAGNOSIS — E669 Obesity, unspecified: Secondary | ICD-10-CM

## 2018-01-23 DIAGNOSIS — Z9189 Other specified personal risk factors, not elsewhere classified: Secondary | ICD-10-CM | POA: Diagnosis not present

## 2018-01-23 DIAGNOSIS — E559 Vitamin D deficiency, unspecified: Secondary | ICD-10-CM

## 2018-01-23 DIAGNOSIS — E8881 Metabolic syndrome: Secondary | ICD-10-CM | POA: Diagnosis not present

## 2018-01-23 DIAGNOSIS — Z6834 Body mass index (BMI) 34.0-34.9, adult: Secondary | ICD-10-CM

## 2018-01-23 MED ORDER — VITAMIN D (ERGOCALCIFEROL) 1.25 MG (50000 UNIT) PO CAPS: 50000.0000 [IU] | ORAL_CAPSULE | ORAL | 0 refills | Status: DC

## 2018-01-23 MED ORDER — METFORMIN HCL 500 MG PO TABS: 500.0000 mg | ORAL_TABLET | Freq: Every day | ORAL | 0 refills | Status: DC
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
## 2018-01-27 NOTE — Progress Notes (Signed)
Office: [phone]  /  Fax: [phone]   HPI:   Chief Complaint: OBESITY Leah Brown is here to discuss her progress with her obesity treatment plan. She is on the Category 3 plan and is following her eating plan approximately 25 % of the time. She states she is exercising 0 minutes 0 times per week. Leah Brown struggled with the plan due to stress at work. She is ready to get back on track. Her weight is 200 lb (90.7 kg) today and has had a weight gain of 3 pounds over a period of 3 weeks since her last visit. She has lost 5 lbs since starting treatment with [place].  Vitamin D deficiency Leah Brown has a diagnosis of vitamin D deficiency. She is currently taking prescription vit D and denies nausea, vomiting or muscle weakness.  Pre-Diabetes Leah Brown has a diagnosis of prediabetes based on her elevated Hgb A1c and was informed this puts her at greater risk of developing diabetes. She denies nausea, vomiting or diarrhea on metformin. Leah Brown continues to work on diet and exercise to decrease risk of diabetes. She reports polyphagia and denies hypoglycemia.  At risk for diabetes Leah Brown is at higher than average risk for developing diabetes due to her obesity and pre-diabetes. She currently denies polyuria or polydipsia.  ALLERGIES: Allergies  Allergen Reactions  . Almond (Diagnostic) Hives and Swelling  . Celery Oil Swelling  . Codeine Nausea Only  . Peanut-Containing Drug Products Other (See Comments)    Allergy testing   . Pollen Extract Swelling  . Tree Extract Hives and Swelling    MEDICATIONS: Current Outpatient Medications on File Prior to Visit  Medication Sig Dispense Refill  . caffeine 200 MG TABS tablet Take 0.5 tablets (100 mg total) by mouth daily as needed.    . cetirizine (ZYRTEC) 10 MG tablet Take 10 mg by mouth as needed.     . citalopram (CELEXA) 10 MG tablet Take 1 tablet (10 mg total) by mouth daily. 90 tablet 3  . Multiple Vitamin (MULTIVITAMIN WITH MINERALS) TABS tablet Take 1  tablet by mouth daily.    . Norgestimate-Ethinyl Estradiol Triphasic (ORTHO TRI-CYCLEN LO) 0.18/0.215/0.25 MG-25 MCG tab Take 1 tablet by mouth daily. 1 Package 11  . ondansetron (ZOFRAN) 4 MG tablet Take 1 tablet (4 mg total) by mouth every 6 (six) hours. 20 tablet 0   No current facility-administered medications on file prior to visit.     PAST MEDICAL HISTORY: Past Medical History:  Diagnosis Date  . Alcohol abuse   . Amenorrhea 2006   Ongoing since 2006  . Anxiety   . Depression   . Food allergy   . GERD (gastroesophageal reflux disease)   . High cholesterol   . Hypertension   . Infertility, female   . Insulin resistance   . Joint pain   . Multiple food allergies   . Obesity   . Ophthalmic migraine 2006  . Skin cancer   . Snores 12/02/2015  . Vitamin D deficiency     PAST SURGICAL HISTORY: Past Surgical History:  Procedure Laterality Date  . SKIN CANCER DESTRUCTION Left 2008   Breast  . WISDOM TOOTH EXTRACTION      SOCIAL HISTORY: Social History   Tobacco Use  . Smoking status: Former Smoker    Packs/day: 0.50    Years: 4.00    Pack years: 2.00    Types: Cigarettes    Start date: 07/10/1999    Last attempt to quit: 07/10/2003    Years  since quitting: 14.5  . Smokeless tobacco: Never Used  Substance Use Topics  . Alcohol use: Yes    Alcohol/week: 1.2 oz    Types: 1 Glasses of wine, 1 Cans of beer per week    Comment: socially  . Drug use: No    FAMILY HISTORY: Family History  Problem Relation Age of Onset  . Hypertension Father   . Diabetes Father   . Obesity Father   . Hyperlipidemia Father   . Appendicitis Mother 21  . Ovarian cancer Maternal Aunt   . Gallstones Maternal Aunt   . Ovarian cancer Maternal Grandmother   . Breast cancer Neg Hx     ROS: Review of Systems  Constitutional: Negative for weight loss.  Gastrointestinal: Negative for diarrhea, nausea and vomiting.  Genitourinary: Negative for frequency.  Musculoskeletal:        Negative for muscle weakness  Endo/Heme/Allergies: Negative for polydipsia.       Negative for hypoglycemia Positive for polyphagia    PHYSICAL EXAM: Blood pressure 99/65, pulse 60, temperature 98.1 F (36.7 C), temperature source Oral, height 5\' 4"  (1.626 m), weight 200 lb (90.7 kg), last menstrual period 12/14/2017, SpO2 100 %. Body mass index is 34.33 kg/m. Physical Exam  Constitutional: She is oriented to person, place, and time. She appears well-developed and well-nourished.  Cardiovascular: Normal rate.  Pulmonary/Chest: Effort normal.  Musculoskeletal: Normal range of motion.  Neurological: She is oriented to person, place, and time.  Skin: Skin is warm and dry.  Psychiatric: She has a normal mood and affect. Her behavior is normal.  Vitals reviewed.   RECENT LABS AND TESTS: BMET    Component Value Date/Time   NA 140 10/31/2017 0919   NA 138 06/11/2013 1014   K 4.3 10/31/2017 0919   K 3.8 06/11/2013 1014   CL 102 10/31/2017 0919   CL 102 06/11/2013 1014   CO2 24 10/31/2017 0919   CO2 27 06/11/2013 1014   GLUCOSE 96 10/31/2017 0919   GLUCOSE 96 06/25/2017 1205   GLUCOSE 96 06/11/2013 1014   BUN 11 10/31/2017 0919   BUN 11 06/11/2013 1014   CREATININE 0.86 10/31/2017 0919   CREATININE 0.72 06/25/2017 1205   CALCIUM 9.4 10/31/2017 0919   CALCIUM 8.7 06/11/2013 1014   GFRNONAA 87 10/31/2017 0919   GFRNONAA 108 06/25/2017 1205   GFRAA 101 10/31/2017 0919   GFRAA 126 06/25/2017 1205   Lab Results  Component Value Date   HGBA1C 5.2 10/31/2017   Lab Results  Component Value Date   INSULIN 20.2 10/31/2017   CBC    Component Value Date/Time   WBC 8.8 10/31/2017 0919   WBC 8.4 06/25/2017 1205   RBC 4.58 10/31/2017 0919   RBC 4.51 06/25/2017 1205   HGB 13.8 10/31/2017 0919   HCT 42.2 10/31/2017 0919   PLT 259 06/25/2017 1205   PLT 296 12/09/2015 0830   MCV 92 10/31/2017 0919   MCV 91 06/11/2013 1014   MCH 30.1 10/31/2017 0919   MCH 30.4 06/25/2017 1205    MCHC 32.7 10/31/2017 0919   MCHC 33.7 06/25/2017 1205   RDW 13.7 10/31/2017 0919   RDW 12.5 06/11/2013 1014   LYMPHSABS 3.5 (H) 10/31/2017 0919   LYMPHSABS 2.6 06/11/2013 1014   MONOABS 560 12/13/2016 0902   MONOABS 0.4 06/11/2013 1014   EOSABS 0.2 10/31/2017 0919   EOSABS 0.1 06/11/2013 1014   BASOSABS 0.1 10/31/2017 0919   BASOSABS 0.0 06/11/2013 1014   Iron/TIBC/Ferritin/ %Sat No results  found for: IRON, TIBC, FERRITIN, IRONPCTSAT Lipid Panel     Component Value Date/Time   CHOL 205 (H) 10/31/2017 0919   CHOL 175 06/11/2013 1014   TRIG 99 10/31/2017 0919   TRIG 52 06/11/2013 1014   HDL 53 10/31/2017 0919   HDL 42 06/11/2013 1014   CHOLHDL 3.5 12/13/2016 0902   VLDL 13 12/13/2016 0902   VLDL 10 06/11/2013 1014   LDLCALC 132 (H) 10/31/2017 0919   LDLCALC 123 (H) 06/11/2013 1014   Hepatic Function Panel     Component Value Date/Time   PROT 7.2 10/31/2017 0919   PROT 7.3 06/11/2013 1014   ALBUMIN 4.3 10/31/2017 0919   ALBUMIN 3.9 06/11/2013 1014   AST 20 10/31/2017 0919   AST 13 (L) 06/11/2013 1014   ALT 20 10/31/2017 0919   ALT 21 06/11/2013 1014   ALKPHOS 73 10/31/2017 0919   ALKPHOS 52 06/11/2013 1014   BILITOT 0.3 10/31/2017 0919   BILITOT 0.4 06/11/2013 1014      Component Value Date/Time   TSH 1.730 10/31/2017 0919   TSH 1.83 12/13/2016 0902   TSH 1.170 12/09/2015 0830   Results for TAMIE, MINTEER (MRN [id number]) as of 01/27/2018 10:17  Ref. Range 10/31/2017 09:19  Vitamin D, 25-Hydroxy Latest Ref Range: 30.0 - 100.0 ng/mL 28.3 (L)   ASSESSMENT AND PLAN: Vitamin D deficiency - Plan: Vitamin D, Ergocalciferol, (DRISDOL) 50000 units CAPS capsule  Insulin resistance - Plan: metFORMIN (GLUCOPHAGE) 500 MG tablet  At risk for diabetes mellitus  Class 1 obesity with serious comorbidity and body mass index (BMI) of 34.0 to 34.9 in adult, unspecified obesity type  PLAN:  Vitamin D Deficiency Leah Brown was informed that low vitamin D levels  contributes to fatigue and are associated with obesity, breast, and colon cancer. She agrees to continue to take prescription Vit D @50 ,000 IU every week #4 with no refills and will follow up for routine testing of vitamin D, at least 2-3 times per year. She was informed of the risk of over-replacement of vitamin D and agrees to not increase her dose unless she discusses this with [place] first. Leah Brown agrees to follow up as directed.  Pre-Diabetes Leah Brown will continue to work on weight loss, exercise, and decreasing simple carbohydrates in her diet to help decrease the risk of diabetes. We dicussed metformin including benefits and risks. She was informed that eating too many simple carbohydrates or too many calories at one sitting increases the likelihood of GI side effects. Leah Brown agreed to continue metformin 500 mg qd #30 with no refills. Leah Brown agreed to follow up with [place] as directed to monitor her progress.  Diabetes risk counseling Leah Brown was given extended (15 minutes) diabetes prevention counseling today. She is 37 y.o. female and has risk factors for diabetes including obesity and pre-diabetes. We discussed intensive lifestyle modifications today with an emphasis on weight loss as well as increasing exercise and decreasing simple carbohydrates in her diet.  Obesity Leah Brown is currently in the action stage of change. As such, her goal is to continue with weight loss efforts She has agreed to follow the Category 3 plan  Leah Brown was given additional protein and lunch ideas today Leah Brown has been instructed to work up to a goal of 150 minutes of combined cardio and strengthening exercise per week for weight loss and overall health benefits. We discussed the following Behavioral Modification Strategies today: no skipping meals, decreasing simple carbohydrates  and work on meal planning and easy cooking plans  Leah Brown has agreed to follow up with our clinic in 2 weeks. She was informed of the importance of frequent  follow up visits to maximize her success with intensive lifestyle modifications for her multiple health conditions.   OBESITY BEHAVIORAL INTERVENTION VISIT  Today's visit was # 6 out of 22.  Starting weight: 205 lbs Starting date: 10/31/17 Today's weight : 200 lbs Today's date: 01/23/2018 Total lbs lost to date: 5    ASK: We discussed the diagnosis of obesity with Leah Brown Brown today and Leah Brown agreed to give [place] permission to discuss obesity behavioral modification therapy today.  ASSESS: Leah Brown has the diagnosis of obesity and her BMI today is 34.31 Leah Brown is in the action stage of change   ADVISE: Leah Brown was educated on the multiple health risks of obesity as well as the benefit of weight loss to improve her health. She was advised of the need for long term treatment and the importance of lifestyle modifications.  AGREE: Multiple dietary modification options and treatment options were discussed and  Leah Brown agreed to the above obesity treatment plan.  I, Doreene Nest, am acting as transcriptionist for Dennard Nip, MD  I have reviewed the above documentation for accuracy and completeness, and I agree with the above. -Dennard Nip, MD

## 2018-01-30 ENCOUNTER — Ambulatory Visit (INDEPENDENT_AMBULATORY_CARE_PROVIDER_SITE_OTHER): Payer: No Typology Code available for payment source | Admitting: Psychology

## 2018-01-30 DIAGNOSIS — F3289 Other specified depressive episodes: Secondary | ICD-10-CM | POA: Diagnosis not present

## 2018-02-03 ENCOUNTER — Ambulatory Visit (HOSPITAL_COMMUNITY)
Admission: RE | Admit: 2018-02-03 | Discharge: 2018-02-03 | Disposition: A | Discharge status: Home / Self Care | Payer: No Typology Code available for payment source | Source: Ambulatory Visit | Attending: Obstetrics and Gynecology | Admitting: Obstetrics and Gynecology

## 2018-02-03 ENCOUNTER — Other Ambulatory Visit: Payer: Self-pay | Admitting: Obstetrics and Gynecology

## 2018-02-03 ENCOUNTER — Other Ambulatory Visit (INDEPENDENT_AMBULATORY_CARE_PROVIDER_SITE_OTHER): Payer: Self-pay | Admitting: Family Medicine

## 2018-02-03 DIAGNOSIS — E559 Vitamin D deficiency, unspecified: Secondary | ICD-10-CM

## 2018-02-03 DIAGNOSIS — N938 Other specified abnormal uterine and vaginal bleeding: Secondary | ICD-10-CM | POA: Insufficient documentation

## 2018-02-03 MED FILL — VIT D2 1.25 MG (50,000 UNIT: 1.25 MG | 28 days supply | Qty: 4 | Fill #0
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
## 2018-02-05 ENCOUNTER — Telehealth: Payer: Self-pay

## 2018-02-05 MED FILL — metFORMIN HCL 500 MG TABS: 500 | 30 days supply | Qty: 30 | Fill #0

## 2018-02-05 NOTE — Telephone Encounter (Signed)
Contacted pt and advised of results and treatment plan.

## 2018-02-10 ENCOUNTER — Ambulatory Visit (INDEPENDENT_AMBULATORY_CARE_PROVIDER_SITE_OTHER): Payer: No Typology Code available for payment source | Admitting: Family Medicine

## 2018-02-10 VITALS — BP 112/71 | HR 73 | Temp 98.3°F | Ht 64.0 in | Wt 195.0 lb

## 2018-02-10 DIAGNOSIS — Z6833 Body mass index (BMI) 33.0-33.9, adult: Secondary | ICD-10-CM

## 2018-02-10 DIAGNOSIS — E669 Obesity, unspecified: Secondary | ICD-10-CM

## 2018-02-10 DIAGNOSIS — E559 Vitamin D deficiency, unspecified: Secondary | ICD-10-CM | POA: Diagnosis not present

## 2018-02-10 MED FILL — CITALOPRAM HBR 10 MG TABLET: 10 | 90 days supply | Qty: 90 | Fill #1

## 2018-02-10 NOTE — Progress Notes (Signed)
Office: [phone]  /  Fax: [phone]   HPI:   Chief Complaint: OBESITY Shalayah is here to discuss her progress with her obesity treatment plan. She is on the Category 3 plan and is following her eating plan approximately 10 % of the time. She states she is exercising 0 minutes 0 times per week. Mckaela has struggled to follow the plan precisely, but she is still being mindful; and is trying to control her portions and make smarter food choices. Her husband has gotten off track, which has made things difficult, but he is getting back to supporting her better. Her weight is 195 lb (88.5 kg) today and has had a weight loss of 5 pounds over a period of 2 weeks since her last visit. She has lost 10 lbs since starting treatment with [place].  Vitamin D deficiency Haven has a diagnosis of vitamin D deficiency. Wilsie is stable on vit D, but she Is not yet at goal. She is due for labs soon. Nakeita denies nausea, vomiting or muscle weakness.  ALLERGIES: Allergies  Allergen Reactions  . Almond (Diagnostic) Hives and Swelling  . Celery Oil Swelling  . Codeine Nausea Only  . Peanut-Containing Drug Products Other (See Comments)    Allergy testing   . Pollen Extract Swelling  . Tree Extract Hives and Swelling    MEDICATIONS: Current Outpatient Medications on File Prior to Visit  Medication Sig Dispense Refill  . caffeine 200 MG TABS tablet Take 0.5 tablets (100 mg total) by mouth daily as needed.    . cetirizine (ZYRTEC) 10 MG tablet Take 10 mg by mouth as needed.     . citalopram (CELEXA) 10 MG tablet Take 1 tablet (10 mg total) by mouth daily. 90 tablet 3  . metFORMIN (GLUCOPHAGE) 500 MG tablet Take 1 tablet (500 mg total) by mouth daily with breakfast. 30 tablet 0  . Multiple Vitamin (MULTIVITAMIN WITH MINERALS) TABS tablet Take 1 tablet by mouth daily.    . Norgestimate-Ethinyl Estradiol Triphasic (ORTHO TRI-CYCLEN LO) 0.18/0.215/0.25 MG-25 MCG tab Take 1 tablet by mouth daily. 1 Package 11  .  ondansetron (ZOFRAN) 4 MG tablet Take 1 tablet (4 mg total) by mouth every 6 (six) hours. 20 tablet 0  . Vitamin D, Ergocalciferol, (DRISDOL) 50000 units CAPS capsule Take 1 capsule (50,000 Units total) by mouth every 7 (seven) days. 4 capsule 0   No current facility-administered medications on file prior to visit.     PAST MEDICAL HISTORY: Past Medical History:  Diagnosis Date  . Alcohol abuse   . Amenorrhea 2006   Ongoing since 2006  . Anxiety   . Depression   . Food allergy   . GERD (gastroesophageal reflux disease)   . High cholesterol   . Hypertension   . Infertility, female   . Insulin resistance   . Joint pain   . Multiple food allergies   . Obesity   . Ophthalmic migraine 2006  . Skin cancer   . Snores 12/02/2015  . Vitamin D deficiency     PAST SURGICAL HISTORY: Past Surgical History:  Procedure Laterality Date  . SKIN CANCER DESTRUCTION Left 2008   Breast  . WISDOM TOOTH EXTRACTION      SOCIAL HISTORY: Social History   Tobacco Use  . Smoking status: Former Smoker    Packs/day: 0.50    Years: 4.00    Pack years: 2.00    Types: Cigarettes    Start date: 07/10/1999    Last attempt to  quit: 07/10/2003    Years since quitting: 14.6  . Smokeless tobacco: Never Used  Substance Use Topics  . Alcohol use: Yes    Alcohol/week: 1.2 oz    Types: 1 Glasses of wine, 1 Cans of beer per week    Comment: socially  . Drug use: No    FAMILY HISTORY: Family History  Problem Relation Age of Onset  . Hypertension Father   . Diabetes Father   . Obesity Father   . Hyperlipidemia Father   . Appendicitis Mother 6  . Ovarian cancer Maternal Aunt   . Gallstones Maternal Aunt   . Ovarian cancer Maternal Grandmother   . Breast cancer Neg Hx     ROS: Review of Systems  Constitutional: Positive for weight loss.  Gastrointestinal: Negative for nausea and vomiting.  Musculoskeletal:       Negative for muscle weakness    PHYSICAL EXAM: Blood pressure 112/71, pulse  73, temperature 98.3 F (36.8 C), temperature source Oral, height 5\' 4"  (1.626 m), weight 195 lb (88.5 kg), SpO2 98 %. Body mass index is 33.47 kg/m. Physical Exam  Constitutional: She is oriented to person, place, and time. She appears well-developed and well-nourished.  Cardiovascular: Normal rate.  Pulmonary/Chest: Effort normal.  Musculoskeletal: Normal range of motion.  Neurological: She is oriented to person, place, and time.  Skin: Skin is warm and dry.  Psychiatric: She has a normal mood and affect. Her behavior is normal.  Vitals reviewed.   RECENT LABS AND TESTS: BMET    Component Value Date/Time   NA 140 10/31/2017 0919   NA 138 06/11/2013 1014   K 4.3 10/31/2017 0919   K 3.8 06/11/2013 1014   CL 102 10/31/2017 0919   CL 102 06/11/2013 1014   CO2 24 10/31/2017 0919   CO2 27 06/11/2013 1014   GLUCOSE 96 10/31/2017 0919   GLUCOSE 96 06/25/2017 1205   GLUCOSE 96 06/11/2013 1014   BUN 11 10/31/2017 0919   BUN 11 06/11/2013 1014   CREATININE 0.86 10/31/2017 0919   CREATININE 0.72 06/25/2017 1205   CALCIUM 9.4 10/31/2017 0919   CALCIUM 8.7 06/11/2013 1014   GFRNONAA 87 10/31/2017 0919   GFRNONAA 108 06/25/2017 1205   GFRAA 101 10/31/2017 0919   GFRAA 126 06/25/2017 1205   Lab Results  Component Value Date   HGBA1C 5.2 10/31/2017   Lab Results  Component Value Date   INSULIN 20.2 10/31/2017   CBC    Component Value Date/Time   WBC 8.8 10/31/2017 0919   WBC 8.4 06/25/2017 1205   RBC 4.58 10/31/2017 0919   RBC 4.51 06/25/2017 1205   HGB 13.8 10/31/2017 0919   HCT 42.2 10/31/2017 0919   PLT 259 06/25/2017 1205   PLT 296 12/09/2015 0830   MCV 92 10/31/2017 0919   MCV 91 06/11/2013 1014   MCH 30.1 10/31/2017 0919   MCH 30.4 06/25/2017 1205   MCHC 32.7 10/31/2017 0919   MCHC 33.7 06/25/2017 1205   RDW 13.7 10/31/2017 0919   RDW 12.5 06/11/2013 1014   LYMPHSABS 3.5 (H) 10/31/2017 0919   LYMPHSABS 2.6 06/11/2013 1014   MONOABS 560 12/13/2016 0902    MONOABS 0.4 06/11/2013 1014   EOSABS 0.2 10/31/2017 0919   EOSABS 0.1 06/11/2013 1014   BASOSABS 0.1 10/31/2017 0919   BASOSABS 0.0 06/11/2013 1014   Iron/TIBC/Ferritin/ %Sat No results found for: IRON, TIBC, FERRITIN, IRONPCTSAT Lipid Panel     Component Value Date/Time   CHOL 205 (H) 10/31/2017  0919   CHOL 175 06/11/2013 1014   TRIG 99 10/31/2017 0919   TRIG 52 06/11/2013 1014   HDL 53 10/31/2017 0919   HDL 42 06/11/2013 1014   CHOLHDL 3.5 12/13/2016 0902   VLDL 13 12/13/2016 0902   VLDL 10 06/11/2013 1014   LDLCALC 132 (H) 10/31/2017 0919   LDLCALC 123 (H) 06/11/2013 1014   Hepatic Function Panel     Component Value Date/Time   PROT 7.2 10/31/2017 0919   PROT 7.3 06/11/2013 1014   ALBUMIN 4.3 10/31/2017 0919   ALBUMIN 3.9 06/11/2013 1014   AST 20 10/31/2017 0919   AST 13 (L) 06/11/2013 1014   ALT 20 10/31/2017 0919   ALT 21 06/11/2013 1014   ALKPHOS 73 10/31/2017 0919   ALKPHOS 52 06/11/2013 1014   BILITOT 0.3 10/31/2017 0919   BILITOT 0.4 06/11/2013 1014      Component Value Date/Time   TSH 1.730 10/31/2017 0919   TSH 1.83 12/13/2016 0902   TSH 1.170 12/09/2015 0830   Results for ALIZEH, MADRIL (MRN [id number]) as of 02/10/2018 17:50  Ref. Range 10/31/2017 09:19  Vitamin D, 25-Hydroxy Latest Ref Range: 30.0 - 100.0 ng/mL 28.3 (L)   ASSESSMENT AND PLAN: Vitamin D deficiency  Class 1 obesity with serious comorbidity and body mass index (BMI) of 33.0 to 33.9 in adult, unspecified obesity type  PLAN:  Vitamin D Deficiency Iylah was informed that low vitamin D levels contributes to fatigue and are associated with obesity, breast, and colon cancer. She agrees to continue to take prescription Vit D @50 ,000 IU every week and will follow up for routine testing of vitamin D, at least 2-3 times per year. She was informed of the risk of over-replacement of vitamin D and agrees to not increase her dose unless she discusses this with [place] first. We will check labs at  the next visit.  We spent > than 50% of the 15 minute visit on the counseling as documented in the note.  Obesity Elma is currently in the action stage of change. As such, her goal is to continue with weight loss efforts She has agreed to follow the Category 3 plan Riata has been instructed to work up to a goal of 150 minutes of combined cardio and strengthening exercise per week for weight loss and overall health benefits. We discussed the following Behavioral Modification Strategies today: increasing lean protein intake, dealing with family or coworker sabotage, holiday eating strategies and travel eating strategies  Alysa has agreed to follow up with our clinic in 2 to 3 weeks fasting. She was informed of the importance of frequent follow up visits to maximize her success with intensive lifestyle modifications for her multiple health conditions.   OBESITY BEHAVIORAL INTERVENTION VISIT  Today's visit was # 7 out of 22.  Starting weight: 205 lbs Starting date: 10/31/17 Today's weight : 195 lbs Today's date: 02/10/2018 Total lbs lost to date: 10    ASK: We discussed the diagnosis of obesity with Blen Jetta Lout today and Rebecca agreed to give [place] permission to discuss obesity behavioral modification therapy today.  ASSESS: Ted has the diagnosis of obesity and her BMI today is 33.46 Gricelda is in the action stage of change   ADVISE: Lashara was educated on the multiple health risks of obesity as well as the benefit of weight loss to improve her health. She was advised of the need for long term treatment and the importance of lifestyle modifications.  AGREE: Multiple dietary modification  options and treatment options were discussed and  Breezy agreed to the above obesity treatment plan.  I, Doreene Nest, am acting as transcriptionist for Dennard Nip, MD  I have reviewed the above documentation for accuracy and completeness, and I agree with the above. -Dennard Nip,  MD

## 2018-02-17 ENCOUNTER — Ambulatory Visit (INDEPENDENT_AMBULATORY_CARE_PROVIDER_SITE_OTHER): Payer: No Typology Code available for payment source | Admitting: Psychology

## 2018-02-17 DIAGNOSIS — F3289 Other specified depressive episodes: Secondary | ICD-10-CM

## 2018-02-26 MED FILL — NORG-EE 0.18-0.215-0.25/0.0: 0.18/0.215/ | 84 days supply | Qty: 84 | Fill #2

## 2018-03-04 ENCOUNTER — Ambulatory Visit (INDEPENDENT_AMBULATORY_CARE_PROVIDER_SITE_OTHER): Payer: Self-pay | Admitting: Family Medicine

## 2018-03-05 ENCOUNTER — Ambulatory Visit (INDEPENDENT_AMBULATORY_CARE_PROVIDER_SITE_OTHER): Payer: No Typology Code available for payment source | Admitting: Physician Assistant

## 2018-03-05 VITALS — BP 102/68 | HR 71 | Temp 98.0°F | Ht 64.0 in | Wt 202.0 lb

## 2018-03-05 DIAGNOSIS — Z9189 Other specified personal risk factors, not elsewhere classified: Secondary | ICD-10-CM

## 2018-03-05 DIAGNOSIS — E7849 Other hyperlipidemia: Secondary | ICD-10-CM | POA: Diagnosis not present

## 2018-03-05 DIAGNOSIS — E559 Vitamin D deficiency, unspecified: Secondary | ICD-10-CM

## 2018-03-05 DIAGNOSIS — Z6834 Body mass index (BMI) 34.0-34.9, adult: Secondary | ICD-10-CM

## 2018-03-05 DIAGNOSIS — E8881 Metabolic syndrome: Secondary | ICD-10-CM

## 2018-03-05 DIAGNOSIS — E669 Obesity, unspecified: Secondary | ICD-10-CM

## 2018-03-05 MED ORDER — METFORMIN HCL 500 MG PO TABS: 500.0000 mg | ORAL_TABLET | Freq: Every day | ORAL | 0 refills | Status: DC

## 2018-03-05 MED ORDER — VITAMIN D (ERGOCALCIFEROL) 1.25 MG (50000 UNIT) PO CAPS: 50000.0000 [IU] | ORAL_CAPSULE | ORAL | 0 refills | Status: DC

## 2018-03-05 MED FILL — VIT D2 1.25 MG (50,000 UNIT: 1.25 MG | 28 days supply | Qty: 4 | Fill #0

## 2018-03-05 MED FILL — metFORMIN HCL 500 MG TABS: 500 | 30 days supply | Qty: 30 | Fill #0

## 2018-03-05 NOTE — Progress Notes (Signed)
Office: [phone]  /  Fax: [phone]   HPI:   Chief Complaint: OBESITY Leah Brown is here to discuss her progress with her obesity treatment plan. She is on the  follow the Category 3 plan and is following her eating plan approximately 35 % of the time. She states she is exercising 0 minutes 0 times per week. Leah Brown is currently struggling with meal planning and getting all of her protein in during the day. She is seeing Dr. Mallie Mussel for help with emotional eating and portion control. She is ready to get back on track.   Her weight is 202 lb (91.6 kg) today and has had a weight gain of 7 pounds over a period of 3 weeks since her last visit. She has lost 3 lbs since starting treatment with [place].  Vitamin D deficiency Leah Brown has a diagnosis of vitamin D deficiency. She is currently taking vit D and denies nausea, vomiting or muscle weakness.   Ref. Range 10/31/2017 09:19  Vitamin D, 25-Hydroxy Latest Ref Range: 30.0 - 100.0 ng/mL 28.3 (L)   Insulin Resistance Leah Brown has a diagnosis of insulin resistance based on her elevated fasting insulin level >5. Although Leah Brown's blood glucose readings are still under good control, insulin resistance puts her at greater risk of metabolic syndrome and diabetes. She is taking metformin currently and continues to work on diet and exercise to decrease risk of diabetes. She denies polyphagia but reports cravings.   Hyperlipidemia Leah Brown has hyperlipidemia and has been trying to improve her cholesterol levels with intensive lifestyle modification including a low saturated fat diet, exercise and weight loss. Her last level was not at goal.  She denies any chest pain, claudication or myalgias.  At risk for osteopenia and osteoporosis Leah Brown is at higher risk of osteopenia and osteoporosis due to vitamin D deficiency.   ALLERGIES: Allergies  Allergen Reactions  . Almond (Diagnostic) Hives and Swelling  . Celery Oil Swelling  . Codeine Nausea Only  .  Peanut-Containing Drug Products Other (See Comments)    Allergy testing   . Pollen Extract Swelling  . Tree Extract Hives and Swelling    MEDICATIONS: Current Outpatient Medications on File Prior to Visit  Medication Sig Dispense Refill  . caffeine 200 MG TABS tablet Take 0.5 tablets (100 mg total) by mouth daily as needed.    . cetirizine (ZYRTEC) 10 MG tablet Take 10 mg by mouth as needed.     . citalopram (CELEXA) 10 MG tablet Take 1 tablet (10 mg total) by mouth daily. 90 tablet 3  . Multiple Vitamin (MULTIVITAMIN WITH MINERALS) TABS tablet Take 1 tablet by mouth daily.    . Norgestimate-Ethinyl Estradiol Triphasic (ORTHO TRI-CYCLEN LO) 0.18/0.215/0.25 MG-25 MCG tab Take 1 tablet by mouth daily. 1 Package 11  . ondansetron (ZOFRAN) 4 MG tablet Take 1 tablet (4 mg total) by mouth every 6 (six) hours. 20 tablet 0   No current facility-administered medications on file prior to visit.     PAST MEDICAL HISTORY: Past Medical History:  Diagnosis Date  . Alcohol abuse   . Amenorrhea 2006   Ongoing since 2006  . Anxiety   . Depression   . Food allergy   . GERD (gastroesophageal reflux disease)   . High cholesterol   . Hypertension   . Infertility, female   . Insulin resistance   . Joint pain   . Multiple food allergies   . Obesity   . Ophthalmic migraine 2006  . Skin cancer   .  Snores 12/02/2015  . Vitamin D deficiency     PAST SURGICAL HISTORY: Past Surgical History:  Procedure Laterality Date  . SKIN CANCER DESTRUCTION Left 2008   Breast  . WISDOM TOOTH EXTRACTION      SOCIAL HISTORY: Social History   Tobacco Use  . Smoking status: Former Smoker    Packs/day: 0.50    Years: 4.00    Pack years: 2.00    Types: Cigarettes    Start date: 07/10/1999    Last attempt to quit: 07/10/2003    Years since quitting: 14.6  . Smokeless tobacco: Never Used  Substance Use Topics  . Alcohol use: Yes    Alcohol/week: 2.0 standard drinks    Types: 1 Glasses of wine, 1 Cans of  beer per week    Comment: socially  . Drug use: No    FAMILY HISTORY: Family History  Problem Relation Age of Onset  . Hypertension Father   . Diabetes Father   . Obesity Father   . Hyperlipidemia Father   . Appendicitis Mother 2  . Ovarian cancer Maternal Aunt   . Gallstones Maternal Aunt   . Ovarian cancer Maternal Grandmother   . Breast cancer Neg Hx     ROS: Review of Systems  Constitutional: Negative for malaise/fatigue and weight loss.  Cardiovascular: Negative for chest pain and claudication.  Gastrointestinal: Negative for nausea and vomiting.  Musculoskeletal:       Negative for muscle weakness  Endo/Heme/Allergies:       Negative for polyphagia    PHYSICAL EXAM: Blood pressure 102/68, pulse 71, temperature 98 F (36.7 C), temperature source Oral, height 5\' 4"  (1.626 m), weight 202 lb (91.6 kg), last menstrual period 03/04/2018, SpO2 99 %. Body mass index is 34.67 kg/m. Physical Exam  Constitutional: She is oriented to person, place, and time. She appears well-developed and well-nourished.  HENT:  Head: Normocephalic.  Eyes: Pupils are equal, round, and reactive to light. EOM are normal.  Neck: Normal range of motion.  Cardiovascular: Normal rate.  Pulmonary/Chest: Effort normal.  Musculoskeletal: Normal range of motion.  Neurological: She is alert and oriented to person, place, and time.  Skin: Skin is warm and dry.  Psychiatric: She has a normal mood and affect. Her behavior is normal.  Vitals reviewed.   RECENT LABS AND TESTS: BMET    Component Value Date/Time   NA 140 10/31/2017 0919   NA 138 06/11/2013 1014   K 4.3 10/31/2017 0919   K 3.8 06/11/2013 1014   CL 102 10/31/2017 0919   CL 102 06/11/2013 1014   CO2 24 10/31/2017 0919   CO2 27 06/11/2013 1014   GLUCOSE 96 10/31/2017 0919   GLUCOSE 96 06/25/2017 1205   GLUCOSE 96 06/11/2013 1014   BUN 11 10/31/2017 0919   BUN 11 06/11/2013 1014   CREATININE 0.86 10/31/2017 0919   CREATININE  0.72 06/25/2017 1205   CALCIUM 9.4 10/31/2017 0919   CALCIUM 8.7 06/11/2013 1014   GFRNONAA 87 10/31/2017 0919   GFRNONAA 108 06/25/2017 1205   GFRAA 101 10/31/2017 0919   GFRAA 126 06/25/2017 1205   Lab Results  Component Value Date   HGBA1C 5.2 10/31/2017   Lab Results  Component Value Date   INSULIN 20.2 10/31/2017   CBC    Component Value Date/Time   WBC 8.8 10/31/2017 0919   WBC 8.4 06/25/2017 1205   RBC 4.58 10/31/2017 0919   RBC 4.51 06/25/2017 1205   HGB 13.8 10/31/2017 0919  HCT 42.2 10/31/2017 0919   PLT 259 06/25/2017 1205   PLT 296 12/09/2015 0830   MCV 92 10/31/2017 0919   MCV 91 06/11/2013 1014   MCH 30.1 10/31/2017 0919   MCH 30.4 06/25/2017 1205   MCHC 32.7 10/31/2017 0919   MCHC 33.7 06/25/2017 1205   RDW 13.7 10/31/2017 0919   RDW 12.5 06/11/2013 1014   LYMPHSABS 3.5 (H) 10/31/2017 0919   LYMPHSABS 2.6 06/11/2013 1014   MONOABS 560 12/13/2016 0902   MONOABS 0.4 06/11/2013 1014   EOSABS 0.2 10/31/2017 0919   EOSABS 0.1 06/11/2013 1014   BASOSABS 0.1 10/31/2017 0919   BASOSABS 0.0 06/11/2013 1014   Iron/TIBC/Ferritin/ %Sat No results found for: IRON, TIBC, FERRITIN, IRONPCTSAT Lipid Panel     Component Value Date/Time   CHOL 205 (H) 10/31/2017 0919   CHOL 175 06/11/2013 1014   TRIG 99 10/31/2017 0919   TRIG 52 06/11/2013 1014   HDL 53 10/31/2017 0919   HDL 42 06/11/2013 1014   CHOLHDL 3.5 12/13/2016 0902   VLDL 13 12/13/2016 0902   VLDL 10 06/11/2013 1014   LDLCALC 132 (H) 10/31/2017 0919   LDLCALC 123 (H) 06/11/2013 1014   Hepatic Function Panel     Component Value Date/Time   PROT 7.2 10/31/2017 0919   PROT 7.3 06/11/2013 1014   ALBUMIN 4.3 10/31/2017 0919   ALBUMIN 3.9 06/11/2013 1014   AST 20 10/31/2017 0919   AST 13 (L) 06/11/2013 1014   ALT 20 10/31/2017 0919   ALT 21 06/11/2013 1014   ALKPHOS 73 10/31/2017 0919   ALKPHOS 52 06/11/2013 1014   BILITOT 0.3 10/31/2017 0919   BILITOT 0.4 06/11/2013 1014      Component  Value Date/Time   TSH 1.730 10/31/2017 0919   TSH 1.83 12/13/2016 0902   TSH 1.170 12/09/2015 0830     Ref. Range 10/31/2017 09:19  Vitamin D, 25-Hydroxy Latest Ref Range: 30.0 - 100.0 ng/mL 28.3 (L)   ASSESSMENT AND PLAN: Vitamin D deficiency - Plan: VITAMIN D 25 Hydroxy (Vit-D Deficiency, Fractures), Vitamin D, Ergocalciferol, (DRISDOL) 50000 units CAPS capsule  Insulin resistance - Plan: Comprehensive metabolic panel, Hemoglobin A1c, Insulin, random, metFORMIN (GLUCOPHAGE) 500 MG tablet  Other hyperlipidemia - Plan: Lipid Panel With LDL/HDL Ratio  At risk for osteoporosis  Class 1 obesity with serious comorbidity and body mass index (BMI) of 34.0 to 34.9 in adult, unspecified obesity type  PLAN: Vitamin D Deficiency Leah Brown was informed that low vitamin D levels contributes to fatigue and are associated with obesity, breast, and colon cancer. She agrees to continue to take prescription. Refill for Vit D @50 ,000 IU every week #4 with no refills written today and will follow up for routine testing of vitamin D, at least 2-3 times per year. She was informed of the risk of over-replacement of vitamin D and agrees to not increase her dose unless she discusses this with [place] first. She agrees to follow up with our office as directed. We will draw labs today.   Insulin Resistance Leah Brown will continue to work on weight loss, exercise, and decreasing simple carbohydrates in her diet to help decrease the risk of diabetes. We dicussed metformin including benefits and risks. She was informed that eating too many simple carbohydrates or too many calories at one sitting increases the likelihood of GI side effects. Refill for metformin 500 mg daily #30 with no refills written today. Leah Brown agreed to follow up with [place] as directed to monitor her progress. We will  draw labs.   Hyperlipidemia Leah Brown was informed of the American Heart Association Guidelines emphasizing intensive lifestyle modifications as the  first line treatment for hyperlipidemia. We discussed many lifestyle modifications today in depth, and Leah Brown will continue to work on decreasing saturated fats such as fatty red meat, butter and many fried foods. She will also increase vegetables and lean protein in her diet and continue to work on exercise and weight loss efforts. We will draw labs today.   At risk for osteopenia and osteoporosis Leah Brown is at risk for osteopenia and osteoporosis due to her vitamin D deficiency. She was encouraged to take her vitamin D and follow her higher calcium diet and increase strengthening exercise to help strengthen her bones and decrease her risk of osteopenia and osteoporosis.  Obesity Leah Brown is currently in the action stage of change. As such, her goal is to continue with weight loss efforts She has agreed to change to Pescatarian meal plan Leah Brown has been instructed to work up to a goal of 150 minutes of combined cardio and strengthening exercise per week for weight loss and overall health benefits. We discussed the following Behavioral Modification Strategies today: work on meal planning and easy cooking plans and planning for success.   Leah Brown has agreed to follow up with our clinic in 2 weeks. She was informed of the importance of frequent follow up visits to maximize her success with intensive lifestyle modifications for her multiple health conditions.   OBESITY BEHAVIORAL INTERVENTION VISIT  Today's visit was # 8  Starting weight: 205 lb Starting date: 10/31/17 Today's weight : 202 lb Today's date: 03/05/2018 Total lbs lost to date: 3 lb At least 15 minutes were spent on discussing the following behavioral intervention visit.   ASK: We discussed the diagnosis of obesity with Leah Brown today and Leah Brown agreed to give [place] permission to discuss obesity behavioral modification therapy today.  ASSESS: Leah Brown has the diagnosis of obesity and her BMI today is 34.66 Leah Brown is in the  action stage of change   ADVISE: Leah Brown was educated on the multiple health risks of obesity as well as the benefit of weight loss to improve her health. She was advised of the need for long term treatment and the importance of lifestyle modifications to improve her current health and to decrease her risk of future health problems.  AGREE: Multiple dietary modification options and treatment options were discussed and  Leah Brown agreed to follow the recommendations documented in the above note.  ARRANGE: Leah Brown was educated on the importance of frequent visits to treat obesity as outlined per CMS and USPSTF guidelines and agreed to schedule her next follow up appointment today.  Leah Roca, Leah Brown , am acting as transcriptionist for Abby Potash, PA-C I, Abby Potash, PA-C have reviewed above note and agree with its content
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
## 2018-03-06 LAB — COMPREHENSIVE METABOLIC PANEL
ALBUMIN: 4.1 g/dL (ref 3.5–5.5)
ALT: 28 IU/L (ref 0–32)
AST: 25 IU/L (ref 0–40)
Albumin/Globulin Ratio: 1.5 (ref 1.2–2.2)
Alkaline Phosphatase: 51 IU/L (ref 39–117)
BUN / CREAT RATIO: 18 (ref 9–23)
BUN: 14 mg/dL (ref 6–20)
Bilirubin Total: 0.4 mg/dL (ref 0.0–1.2)
CALCIUM: 8.9 mg/dL (ref 8.7–10.2)
CO2: 22 mmol/L (ref 20–29)
CREATININE: 0.79 mg/dL (ref 0.57–1.00)
Chloride: 102 mmol/L (ref 96–106)
GFR calc Af Amer: 111 mL/min/{1.73_m2} (ref >59)
GFR, EST NON AFRICAN AMERICAN: 97 mL/min/{1.73_m2} (ref >59)
GLOBULIN, TOTAL: 2.7 g/dL (ref 1.5–4.5)
Glucose: 88 mg/dL (ref 65–99)
Potassium: 4.1 mmol/L (ref 3.5–5.2)
Sodium: 140 mmol/L (ref 134–144)
TOTAL PROTEIN: 6.8 g/dL (ref 6.0–8.5)

## 2018-03-06 LAB — LIPID PANEL WITH LDL/HDL RATIO
CHOLESTEROL TOTAL: 211 mg/dL — AB (ref 100–199)
HDL: 53 mg/dL (ref >39)
LDL Calculated: 132 mg/dL — ABNORMAL HIGH (ref 0–99)
LDl/HDL Ratio: 2.5 ratio (ref 0.0–3.2)
Triglycerides: 132 mg/dL (ref 0–149)
VLDL CHOLESTEROL CAL: 26 mg/dL (ref 5–40)

## 2018-03-06 LAB — HEMOGLOBIN A1C
ESTIMATED AVERAGE GLUCOSE: 103 mg/dL
Hgb A1c MFr Bld: 5.2 % (ref 4.8–5.6)

## 2018-03-06 LAB — VITAMIN D 25 HYDROXY (VIT D DEFICIENCY, FRACTURES): VIT D 25 HYDROXY: 39 ng/mL (ref 30.0–100.0)

## 2018-03-06 LAB — INSULIN, RANDOM: INSULIN: 11.9 u[IU]/mL (ref 2.6–24.9)

## 2018-03-11 ENCOUNTER — Ambulatory Visit (INDEPENDENT_AMBULATORY_CARE_PROVIDER_SITE_OTHER): Payer: No Typology Code available for payment source | Admitting: Psychology

## 2018-03-11 DIAGNOSIS — F3289 Other specified depressive episodes: Secondary | ICD-10-CM

## 2018-03-25 NOTE — Progress Notes (Unsigned)
  Office: [phone]  /  Fax: [phone]   Date: March 31, 2018   Time Seen:*** Duration:*** Provider: Glennie Isle, Psy.D. Type of Session: Individual Therapy   HPI: ***  Session Content: Session focused on the following treatment goal: {gbtxgoals:21759}. The session was initiated with the administration of the PHQ-9 and GAD-7, as well as a brief check-in.   Thao was receptive to today's session as evidenced by ***.  Mental Status Examination: Korrine arrived on time for the appointment. She presented as appropriately dressed and groomed. Liticia appeared her stated age and demonstrated adequate orientation to time, place, person, and purpose of the appointment. She also demonstrated appropriate eye contact. No psychomotor abnormalities or behavioral peculiarities noted. Her mood was {gbmood:21757} with congruent affect. Her thought processes were logical, linear, and goal-directed. No hallucinations, delusions, bizarre thinking or behavior reported or observed. Judgment, insight, and impulse control appeared to be grossly intact. There was no evidence of paraphasias (i.e., errors in speech, gross mispronunciations, and word substitutions), repetition deficits, or disturbances in volume or prosody (i.e., rhythm and intonation). There was no evidence of attention or memory impairments. Maddison denied current suicidal and homicidal ideation, intent or plan.  Structured Assessment Results: The Patient Health Questionnaire-9 (PHQ-9) is a self-report measure that assesses symptoms and severity of depression over the course of the last two weeks. Cherry obtained a score of *** suggesting {GBPHQ9SEVERITY:21752}. Azusena finds the endorsed symptoms to be {gbphq9difficulty:21754}.  The Generalized Anxiety Disorder-7 (GAD-7) is a brief self-report measure that assesses symptoms of anxiety over the course of the last two weeks. Rubye obtained a score of *** suggesting  {gbgad7severity:21753}.  Interventions: Carmita was administered the PHQ-9 and GAD-7 for symptom monitoring. Content from the last session was reviewed. Throughout today's session, empathic reflections and validation were provided. Psychoeducation regarding *** was provided and *** [insert other interventions].   DSM-5 Diagnosis: ***  Treatment Goal & Progress: Denea was seen for an initial appointment with this provider on *** during which the following treatment goal was established: {gbtxgoals:21759}. Envi has demonstrated progress in her goal of *** as evidenced by ***  Plan: Nichoel continues to appear able and willing to participate as evidenced by engagement in reciprocal conversation, and asking questions for clarification as appropriate.*** The next appointment will be scheduled in {gbweeks:21758}. The next session will focus on reviewing learned skills, and working towards the established treatment goal.***

## 2018-03-28 ENCOUNTER — Encounter (INDEPENDENT_AMBULATORY_CARE_PROVIDER_SITE_OTHER): Payer: Self-pay | Admitting: Physician Assistant

## 2018-03-31 ENCOUNTER — Ambulatory Visit (INDEPENDENT_AMBULATORY_CARE_PROVIDER_SITE_OTHER): Payer: Self-pay | Admitting: Psychology

## 2018-04-01 ENCOUNTER — Ambulatory Visit (INDEPENDENT_AMBULATORY_CARE_PROVIDER_SITE_OTHER): Payer: Self-pay | Admitting: Psychology

## 2018-04-03 ENCOUNTER — Ambulatory Visit (INDEPENDENT_AMBULATORY_CARE_PROVIDER_SITE_OTHER): Payer: No Typology Code available for payment source | Admitting: Physician Assistant

## 2018-04-03 VITALS — BP 110/69 | HR 72 | Temp 98.9°F | Ht 64.0 in | Wt 200.0 lb

## 2018-04-03 DIAGNOSIS — E669 Obesity, unspecified: Secondary | ICD-10-CM

## 2018-04-03 DIAGNOSIS — Z9189 Other specified personal risk factors, not elsewhere classified: Secondary | ICD-10-CM

## 2018-04-03 DIAGNOSIS — E559 Vitamin D deficiency, unspecified: Secondary | ICD-10-CM | POA: Diagnosis not present

## 2018-04-03 DIAGNOSIS — E8881 Metabolic syndrome: Secondary | ICD-10-CM

## 2018-04-03 DIAGNOSIS — Z6834 Body mass index (BMI) 34.0-34.9, adult: Secondary | ICD-10-CM

## 2018-04-03 MED ORDER — VITAMIN D (ERGOCALCIFEROL) 1.25 MG (50000 UNIT) PO CAPS: 50000.0000 [IU] | ORAL_CAPSULE | ORAL | 0 refills | Status: DC

## 2018-04-03 MED ORDER — METFORMIN HCL 500 MG PO TABS: 500.0000 mg | ORAL_TABLET | Freq: Every day | ORAL | 0 refills | Status: DC

## 2018-04-03 MED FILL — VIT D2 1.25 MG (50,000 UNIT: 1.25 MG | 28 days supply | Qty: 4 | Fill #0

## 2018-04-03 MED FILL — metFORMIN HCL 500 MG TABS: 500 | 30 days supply | Qty: 30 | Fill #0

## 2018-04-07 NOTE — Progress Notes (Signed)
Office: [phone]  /  Fax: [phone]   HPI:   Chief Complaint: OBESITY Leah Brown is here to discuss her progress with her obesity treatment plan. She is on the  follow our [place] and is following her eating plan approximately 10 % of the time. She states she is exercising 20-30 minutes 1 times per week. Leah Brown did well with weight loss. She does not like the pescartarian plan and wants to change to a different plan. She is interested in journaling.  Her weight is 200 lb (90.7 kg) today and has had a weight loss of 2 pounds over a period of 4 weeks since her last visit. She has lost 5 lbs since starting treatment with [place].  Vitamin D deficiency Leah Brown has a diagnosis of vitamin D deficiency. She is currently taking vit D and denies nausea, vomiting or muscle weakness.  Insulin Resistance Leah Brown has a diagnosis of insulin resistance based on her elevated fasting insulin level >5. Although Leah Brown's blood glucose readings are still under good control, insulin resistance puts her at greater risk of metabolic syndrome and diabetes. She is taking metformin currently and continues to work on diet and exercise to decrease risk of diabetes.  At risk for osteopenia and osteoporosis Leah Brown is at higher risk of osteopenia and osteoporosis due to vitamin D deficiency.    ALLERGIES: Allergies  Allergen Reactions  . Almond (Diagnostic) Hives and Swelling  . Celery Oil Swelling  . Codeine Nausea Only  . Peanut-Containing Drug Products Other (See Comments)    Allergy testing   . Pollen Extract Swelling  . Tree Extract Hives and Swelling    MEDICATIONS: Current Outpatient Medications on File Prior to Visit  Medication Sig Dispense Refill  . caffeine 200 MG TABS tablet Take 0.5 tablets (100 mg total) by mouth daily as needed.    . cetirizine (ZYRTEC) 10 MG tablet Take 10 mg by mouth as needed.     . citalopram (CELEXA) 10 MG tablet Take 1 tablet (10 mg total) by mouth daily. 90 tablet 3    . Multiple Vitamin (MULTIVITAMIN WITH MINERALS) TABS tablet Take 1 tablet by mouth daily.    . Norgestimate-Ethinyl Estradiol Triphasic (ORTHO TRI-CYCLEN LO) 0.18/0.215/0.25 MG-25 MCG tab Take 1 tablet by mouth daily. 1 Package 11  . ondansetron (ZOFRAN) 4 MG tablet Take 1 tablet (4 mg total) by mouth every 6 (six) hours. 20 tablet 0  . psyllium (METAMUCIL) 58.6 % powder Take 1 packet by mouth daily.     No current facility-administered medications on file prior to visit.     PAST MEDICAL HISTORY: Past Medical History:  Diagnosis Date  . Alcohol abuse   . Amenorrhea 2006   Ongoing since 2006  . Anxiety   . Depression   . Food allergy   . GERD (gastroesophageal reflux disease)   . High cholesterol   . Hypertension   . Infertility, female   . Insulin resistance   . Joint pain   . Multiple food allergies   . Obesity   . Ophthalmic migraine 2006  . Skin cancer   . Snores 12/02/2015  . Vitamin D deficiency     PAST SURGICAL HISTORY: Past Surgical History:  Procedure Laterality Date  . SKIN CANCER DESTRUCTION Left 2008   Breast  . WISDOM TOOTH EXTRACTION      SOCIAL HISTORY: Social History   Tobacco Use  . Smoking status: Former Smoker    Packs/day: 0.50    Years: 4.00  Pack years: 2.00    Types: Cigarettes    Start date: 07/10/1999    Last attempt to quit: 07/10/2003    Years since quitting: 14.7  . Smokeless tobacco: Never Used  Substance Use Topics  . Alcohol use: Yes    Alcohol/week: 2.0 standard drinks    Types: 1 Glasses of wine, 1 Cans of beer per week    Comment: socially  . Drug use: No    FAMILY HISTORY: Family History  Problem Relation Age of Onset  . Hypertension Father   . Diabetes Father   . Obesity Father   . Hyperlipidemia Father   . Appendicitis Mother 72  . Ovarian cancer Maternal Aunt   . Gallstones Maternal Aunt   . Ovarian cancer Maternal Grandmother   . Breast cancer Neg Hx     ROS: Review of Systems  Constitutional: Positive  for weight loss.  All other systems reviewed and are negative.   PHYSICAL EXAM: Blood pressure 110/69, pulse 72, temperature 98.9 F (37.2 C), temperature source Oral, height 5\' 4"  (1.626 m), weight 200 lb (90.7 kg), last menstrual period 04/01/2018, SpO2 98 %. Body mass index is 34.33 kg/m. Physical Exam  Constitutional: She is oriented to person, place, and time. She appears well-developed and well-nourished.  HENT:  Head: Normocephalic.  Eyes: EOM are normal.  Neck: Normal range of motion.  Pulmonary/Chest: Effort normal.  Musculoskeletal: Normal range of motion.  Neurological: She is alert and oriented to person, place, and time.  Skin: Skin is warm and dry.  Psychiatric: She has a normal mood and affect. Her behavior is normal.  Vitals reviewed.   RECENT LABS AND TESTS: BMET    Component Value Date/Time   NA 140 03/05/2018 0835   NA 138 06/11/2013 1014   K 4.1 03/05/2018 0835   K 3.8 06/11/2013 1014   CL 102 03/05/2018 0835   CL 102 06/11/2013 1014   CO2 22 03/05/2018 0835   CO2 27 06/11/2013 1014   GLUCOSE 88 03/05/2018 0835   GLUCOSE 96 06/25/2017 1205   GLUCOSE 96 06/11/2013 1014   BUN 14 03/05/2018 0835   BUN 11 06/11/2013 1014   CREATININE 0.79 03/05/2018 0835   CREATININE 0.72 06/25/2017 1205   CALCIUM 8.9 03/05/2018 0835   CALCIUM 8.7 06/11/2013 1014   GFRNONAA 97 03/05/2018 0835   GFRNONAA 108 06/25/2017 1205   GFRAA 111 03/05/2018 0835   GFRAA 126 06/25/2017 1205   Lab Results  Component Value Date   HGBA1C 5.2 03/05/2018   HGBA1C 5.2 10/31/2017   Lab Results  Component Value Date   INSULIN 11.9 03/05/2018   INSULIN 20.2 10/31/2017   CBC    Component Value Date/Time   WBC 8.8 10/31/2017 0919   WBC 8.4 06/25/2017 1205   RBC 4.58 10/31/2017 0919   RBC 4.51 06/25/2017 1205   HGB 13.8 10/31/2017 0919   HCT 42.2 10/31/2017 0919   PLT 259 06/25/2017 1205   PLT 296 12/09/2015 0830   MCV 92 10/31/2017 0919   MCV 91 06/11/2013 1014   MCH  30.1 10/31/2017 0919   MCH 30.4 06/25/2017 1205   MCHC 32.7 10/31/2017 0919   MCHC 33.7 06/25/2017 1205   RDW 13.7 10/31/2017 0919   RDW 12.5 06/11/2013 1014   LYMPHSABS 3.5 (H) 10/31/2017 0919   LYMPHSABS 2.6 06/11/2013 1014   MONOABS 560 12/13/2016 0902   MONOABS 0.4 06/11/2013 1014   EOSABS 0.2 10/31/2017 0919   EOSABS 0.1 06/11/2013 1014  BASOSABS 0.1 10/31/2017 0919   BASOSABS 0.0 06/11/2013 1014   Iron/TIBC/Ferritin/ %Sat No results found for: IRON, TIBC, FERRITIN, IRONPCTSAT Lipid Panel     Component Value Date/Time   CHOL 211 (H) 03/05/2018 0835   CHOL 175 06/11/2013 1014   TRIG 132 03/05/2018 0835   TRIG 52 06/11/2013 1014   HDL 53 03/05/2018 0835   HDL 42 06/11/2013 1014   CHOLHDL 3.5 12/13/2016 0902   VLDL 13 12/13/2016 0902   VLDL 10 06/11/2013 1014   LDLCALC 132 (H) 03/05/2018 0835   LDLCALC 123 (H) 06/11/2013 1014   Hepatic Function Panel     Component Value Date/Time   PROT 6.8 03/05/2018 0835   PROT 7.3 06/11/2013 1014   ALBUMIN 4.1 03/05/2018 0835   ALBUMIN 3.9 06/11/2013 1014   AST 25 03/05/2018 0835   AST 13 (L) 06/11/2013 1014   ALT 28 03/05/2018 0835   ALT 21 06/11/2013 1014   ALKPHOS 51 03/05/2018 0835   ALKPHOS 52 06/11/2013 1014   BILITOT 0.4 03/05/2018 0835   BILITOT 0.4 06/11/2013 1014      Component Value Date/Time   TSH 1.730 10/31/2017 0919   TSH 1.83 12/13/2016 0902   TSH 1.170 12/09/2015 0830    ASSESSMENT AND PLAN: Vitamin D deficiency - Plan: Vitamin D, Ergocalciferol, (DRISDOL) 50000 units CAPS capsule  Insulin resistance - Plan: metFORMIN (GLUCOPHAGE) 500 MG tablet  At risk for osteoporosis  Class 1 obesity with serious comorbidity and body mass index (BMI) of 34.0 to 34.9 in adult, unspecified obesity type  PLAN: Vitamin D Deficiency Leah Brown was informed that low vitamin D levels contributes to fatigue and are associated with obesity, breast, and colon cancer. She agrees to continue to take prescription Vit D @50 ,000  IU every week in which a prescription was written today for a 30 day supply and no refills and will follow up for routine testing of vitamin D, at least 2-3 times per year. She was informed of the risk of over-replacement of vitamin D and agrees to not increase her dose unless she discusses this with [place] first.  Insulin Resistance Leah Brown will continue to work on weight loss, exercise, and decreasing simple carbohydrates in her diet to help decrease the risk of diabetes. We dicussed metformin including benefits and risks. She was informed that eating too many simple carbohydrates or too many calories at one sitting increases the likelihood of GI side effects. Leah Brown requested metformin for now and prescription was written today. Leah Brown agreed to follow up with [place] as directed to monitor her progress.  At risk for osteopenia and osteoporosis Leah Brown was given extended  (15 minutes) osteoporosis prevention counseling today. Leah Brown is at risk for osteopenia and osteoporsis due to her vitamin D deficiency. She was encouraged to take her vitamin D and follow her higher calcium diet and increase strengthening exercise to help strengthen her bones and decrease her risk of osteopenia and osteoporosis.  Obesity Leah Brown is currently in the action stage of change. As such, her goal is to continue with weight loss efforts She has agreed to follow the Category 3 plan and journaling 1500 calories with 90 g of protein.  Leah Brown has been instructed to work up to a goal of 150 minutes of combined cardio and strengthening exercise per week for weight loss and overall health benefits. We discussed the following Behavioral Modification Stratagies today: keeping healthy foods in the home, planning for success and keep a strict food journal.    Leah Brown has  agreed to follow up with our clinic in 3 weeks. She was informed of the importance of frequent follow up visits to maximize her success with intensive lifestyle modifications for her  multiple health conditions.   OBESITY BEHAVIORAL INTERVENTION VISIT  Today's visit was # 9   Starting weight: 205 lb Starting date: 10/31/17 Today's weight : Weight: 200 lb (90.7 kg)  Today's date: 04/03/18 Total lbs lost to date: 5    ASK: We discussed the diagnosis of obesity with Leah Brown today and Leah Brown agreed to give [place] permission to discuss obesity behavioral modification therapy today.  ASSESS: Leah Brown has the diagnosis of obesity and her BMI today is @TBMI @ Leah Brown is in the action stage of change   ADVISE: Leah Brown was educated on the multiple health risks of obesity as well as the benefit of weight loss to improve her health. She was advised of the need for long term treatment and the importance of lifestyle modifications to improve her current health and to decrease her risk of future health problems.  AGREE: Multiple dietary modification options and treatment options were discussed and  Leah Brown agreed to follow the recommendations documented in the above note.  ARRANGE: Leah Brown was educated on the importance of frequent visits to treat obesity as outlined per CMS and USPSTF guidelines and agreed to schedule her next follow up appointment today.  I, Ferrin Moore, am acting as Location manager for [employer]. Leah Potash, PA-C have reviewed above note and agree with its content

## 2018-04-08 ENCOUNTER — Encounter (INDEPENDENT_AMBULATORY_CARE_PROVIDER_SITE_OTHER): Payer: Self-pay

## 2018-04-22 ENCOUNTER — Encounter (INDEPENDENT_AMBULATORY_CARE_PROVIDER_SITE_OTHER): Payer: Self-pay | Admitting: Family Medicine

## 2018-04-22 ENCOUNTER — Encounter (INDEPENDENT_AMBULATORY_CARE_PROVIDER_SITE_OTHER): Payer: Self-pay

## 2018-04-23 ENCOUNTER — Telehealth (INDEPENDENT_AMBULATORY_CARE_PROVIDER_SITE_OTHER): Payer: Self-pay

## 2018-04-23 ENCOUNTER — Encounter (INDEPENDENT_AMBULATORY_CARE_PROVIDER_SITE_OTHER): Payer: Self-pay

## 2018-04-24 ENCOUNTER — Encounter (INDEPENDENT_AMBULATORY_CARE_PROVIDER_SITE_OTHER): Payer: Self-pay

## 2018-04-24 ENCOUNTER — Ambulatory Visit (INDEPENDENT_AMBULATORY_CARE_PROVIDER_SITE_OTHER): Payer: Self-pay | Admitting: Physician Assistant

## 2018-04-30 ENCOUNTER — Encounter: Payer: Self-pay | Admitting: Family Medicine

## 2018-05-01 ENCOUNTER — Encounter: Payer: Self-pay | Admitting: Family Medicine

## 2018-05-01 NOTE — Telephone Encounter (Signed)
Please address with patient She needs an appointment You do not need to send this back to me

## 2018-05-01 NOTE — Telephone Encounter (Signed)
Leah Brown, did you see this? Have you taken care of it? It is over 5 week old.

## 2018-05-01 NOTE — Telephone Encounter (Signed)
Please address with patient, needs appt

## 2018-05-07 ENCOUNTER — Ambulatory Visit (INDEPENDENT_AMBULATORY_CARE_PROVIDER_SITE_OTHER): Payer: Self-pay | Admitting: Bariatrics

## 2018-05-07 MED FILL — CITALOPRAM HBR 10 MG TABLET: 10 | 90 days supply | Qty: 90 | Fill #2

## 2018-05-16 ENCOUNTER — Ambulatory Visit (INDEPENDENT_AMBULATORY_CARE_PROVIDER_SITE_OTHER): Payer: No Typology Code available for payment source | Admitting: Family Medicine

## 2018-05-16 ENCOUNTER — Encounter: Payer: Self-pay | Admitting: Family Medicine

## 2018-05-16 DIAGNOSIS — E8881 Metabolic syndrome: Secondary | ICD-10-CM

## 2018-05-16 DIAGNOSIS — E66812 Obesity, class 2: Secondary | ICD-10-CM

## 2018-05-16 DIAGNOSIS — Z6835 Body mass index (BMI) 35.0-35.9, adult: Secondary | ICD-10-CM | POA: Diagnosis not present

## 2018-05-16 DIAGNOSIS — E6609 Other obesity due to excess calories: Secondary | ICD-10-CM | POA: Diagnosis not present

## 2018-05-16 DIAGNOSIS — E88819 Insulin resistance, unspecified: Secondary | ICD-10-CM

## 2018-05-16 MED ORDER — METFORMIN HCL 500 MG PO TABS: 500.0000 mg | ORAL_TABLET | Freq: Every day | ORAL | 5 refills | Status: DC

## 2018-05-16 MED FILL — metFORMIN HCL 500 MG TABS: 500 | 30 days supply | Qty: 30 | Fill #0

## 2018-05-16 NOTE — Progress Notes (Signed)
BP 108/62   Pulse 81   Temp 98.3 F (36.8 C) (Oral)   Ht 5\' 4"  (1.626 m)   Wt 207 lb 4.8 oz (94 kg)   LMP 05/05/2018   SpO2 98%   BMI 35.58 kg/m    Subjective:    Patient ID: Leah Brown, female    DOB: Feb 04, 1981, 37 y.o.   MRN: [id number]  HPI: Leah Brown is a 37 y.o. female  Chief Complaint  Patient presents with  . Follow-up  . Medication Refill    HPI Patient is here to get a medicine refill She was going to healthy weight and wellness; had to stop that for a little bit She was on meds per Dr. Leafy Ro; she felt like they were helping She did some journaling; they helped her with food choices She saw a counselor and was mindful of how much she was eating If someone is motivated, she thought it was a great experience Not really shooting for a certain weight by a certain time; more focusing on just eating to get healthy; the number was not as important She was doing the vitamin D every week and her last vit D level was 39 in August Last vit D pill was probably a few weeks ago Last Cr was 0.79 in August She takes caffeine tablets, not every day, just once a week; no chest pain or palpitations; takes just a half; no coffee or soda Previous LDLs reviewed; does eat some cow and pig food Normal insulin and normal A1c normal CMP, normal thyroid tests Doing exercise; does an hour of mixture of cardio and machine every Tuesday; goal is spin class weekly; she has the capability to check her steps; 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 and will work on taking the stairs   Depression screen [facility] 2/9 05/16/2018 03/11/2018 02/17/2018 01/30/2018 12/16/2017  Decreased Interest 0 1 1 1  0  Down, Depressed, Hopeless 0 0 0 0 1  PHQ - 2 Score 0 1 1 1 1   Altered sleeping 0 1 1 2 1   Tired, decreased energy 0 2 1 2 1   Change in appetite 0 2 1 2  0  Feeling bad or failure about yourself  0 0 0 0 0  Trouble concentrating 0 0 0 1 0  Moving slowly or fidgety/restless 0 0 0 0 0  Suicidal  thoughts 0 0 0 0 0  PHQ-9 Score 0 6 4 8 3   Difficult doing work/chores Not difficult at all - - - Not difficult at all   Fall Risk  05/16/2018 12/16/2017 11/18/2017 07/18/2017 06/25/2017  Falls in the past year? 0 No No No No  Number falls in past yr: 0 - - - -    Relevant past medical, surgical, family and social history reviewed Past Medical History:  Diagnosis Date  . Alcohol abuse   . Amenorrhea 2006   Ongoing since 2006  . Anxiety   . Depression   . Food allergy   . GERD (gastroesophageal reflux disease)   . High cholesterol   . Hypertension   . Infertility, female   . Insulin resistance   . Joint pain   . Multiple food allergies   . Obesity   . Ophthalmic migraine 2006  . Skin cancer   . Snores 12/02/2015  . Vitamin D deficiency    Past Surgical History:  Procedure Laterality Date  . SKIN CANCER DESTRUCTION Left 2008   Breast  . WISDOM TOOTH EXTRACTION  Family History  Problem Relation Age of Onset  . Hypertension Father   . Diabetes Father   . Obesity Father   . Hyperlipidemia Father   . Appendicitis Mother 8  . Ovarian cancer Maternal Aunt   . Gallstones Maternal Aunt   . Ovarian cancer Maternal Grandmother   . Breast cancer Neg Hx    Social History   Tobacco Use  . Smoking status: Former Smoker    Packs/day: 0.50    Years: 4.00    Pack years: 2.00    Types: Cigarettes    Start date: 07/10/1999    Last attempt to quit: 07/10/2003    Years since quitting: 14.8  . Smokeless tobacco: Never Used  Substance Use Topics  . Alcohol use: Yes    Alcohol/week: 2.0 standard drinks    Types: 1 Glasses of wine, 1 Cans of beer per week    Comment: socially  . Drug use: No     [facility] Visit from 05/16/2018 in [facility]  AUDIT-C Score  1      Interim medical history since last visit reviewed. Allergies and medications reviewed  Review of Systems Per HPI unless specifically indicated above     Objective:    BP 108/62   Pulse 81    Temp 98.3 F (36.8 C) (Oral)   Ht 5\' 4"  (1.626 m)   Wt 207 lb 4.8 oz (94 kg)   LMP 05/05/2018   SpO2 98%   BMI 35.58 kg/m   Wt Readings from Last 3 Encounters:  05/16/18 207 lb 4.8 oz (94 kg)  04/03/18 200 lb (90.7 kg)  03/05/18 202 lb (91.6 kg)    Physical Exam  Constitutional: She appears well-developed and well-nourished. No distress.  Obese female  HENT:  Mouth/Throat: Mucous membranes are normal.  Eyes: EOM are normal. No scleral icterus.  Cardiovascular: Normal rate and regular rhythm.  Pulmonary/Chest: Effort normal and breath sounds normal.  Psychiatric: She has a normal mood and affect. Her behavior is normal.    Results for orders placed or performed in visit on 03/05/18  Comprehensive metabolic panel  Result Value Ref Range   Glucose 88 65 - 99 mg/dL   BUN 14 6 - 20 mg/dL   Creatinine, Ser 0.79 0.57 - 1.00 mg/dL   GFR calc non Af Amer 97 >59 mL/min/1.73   GFR calc Af Amer 111 >59 mL/min/1.73   BUN/Creatinine Ratio 18 9 - 23   Sodium 140 134 - 144 mmol/L   Potassium 4.1 3.5 - 5.2 mmol/L   Chloride 102 96 - 106 mmol/L   CO2 22 20 - 29 mmol/L   Calcium 8.9 8.7 - 10.2 mg/dL   Total Protein 6.8 6.0 - 8.5 g/dL   Albumin 4.1 3.5 - 5.5 g/dL   Globulin, Total 2.7 1.5 - 4.5 g/dL   Albumin/Globulin Ratio 1.5 1.2 - 2.2   Bilirubin Total 0.4 0.0 - 1.2 mg/dL   Alkaline Phosphatase 51 39 - 117 IU/L   AST 25 0 - 40 IU/L   ALT 28 0 - 32 IU/L  Hemoglobin A1c  Result Value Ref Range   Hgb A1c MFr Bld 5.2 4.8 - 5.6 %   Est. average glucose Bld gHb Est-mCnc 103 mg/dL  Insulin, random  Result Value Ref Range   INSULIN 11.9 2.6 - 24.9 uIU/mL  Lipid Panel With LDL/HDL Ratio  Result Value Ref Range   Cholesterol, Total 211 (H) 100 - 199 mg/dL   Triglycerides 132 0 -  149 mg/dL   HDL 53 >39 mg/dL   VLDL Cholesterol Cal 26 5 - 40 mg/dL   LDL Calculated 132 (H) 0 - 99 mg/dL   LDl/HDL Ratio 2.5 0.0 - 3.2 ratio  VITAMIN D 25 Hydroxy (Vit-D Deficiency, Fractures)  Result  Value Ref Range   Vit D, 25-Hydroxy 39.0 30.0 - 100.0 ng/mL      Assessment & Plan:   Problem List Items Addressed This Visit      Other   Obesity    Encouraged increased activity; she will consider taking the stairs at work to the 3rd floor, tracking steps; being mindful of eating, portions; limit high fat foods, discussed calories per gram in carbs, proteins, fats; limit foods from cows and pigs; continue metformin, refill sent; she is welcome to return here in 3 months or she can return to the healthy weight and wellness clinic; encouragement given      Relevant Medications   metFORMIN (GLUCOPHAGE) 500 MG tablet    Other Visit Diagnoses    Insulin resistance       Relevant Medications   metFORMIN (GLUCOPHAGE) 500 MG tablet       Follow up plan: Return in about 3 months (around 08/16/2018).  An after-visit summary was printed and given to the patient at [place].  Please see the patient instructions which may contain other information and recommendations beyond what is mentioned above in the assessment and plan.  Meds ordered this encounter  Medications  . metFORMIN (GLUCOPHAGE) 500 MG tablet    Sig: Take 1 tablet (500 mg total) by mouth daily with breakfast.    Dispense:  30 tablet    Refill:  5    No orders of the defined types were placed in this encounter.  Medications Discontinued During This Encounter  Medication Reason  . metFORMIN (GLUCOPHAGE) 500 MG tablet Reorder  . ondansetron (ZOFRAN) 4 MG tablet Completed Course  . Vitamin D, Ergocalciferol, (DRISDOL) 50000 units CAPS capsule Completed Course

## 2018-05-16 NOTE — Patient Instructions (Addendum)
Try to limit saturated fats in your diet (bologna, hot dogs, barbeque, cheeseburgers, hamburgers, steak, bacon, sausage, cheese, etc.) and get more fresh fruits, vegetables, and whole grains Take 800 iu of vitamin D3 over the counter daily Check out the information at familydoctor.org entitled "Nutrition for Weight Loss: What You Need to Know about Fad Diets" Try to lose between 1-2 pounds per week by taking in fewer calories and burning off more calories You can succeed by limiting portions, limiting foods dense in calories and fat, becoming more active, and drinking 8 glasses of water a day (64 ounces) Don't skip meals, especially breakfast, as skipping meals may alter your metabolism Do not use over-the-counter weight loss pills or gimmicks that claim rapid weight loss A healthy BMI (or body mass index) is between 18.5 and 24.9 You can calculate your ideal BMI at the [place] website [employer]fr

## 2018-05-16 NOTE — Assessment & Plan Note (Signed)
Encouraged increased activity; she will consider taking the stairs at work to the 3rd floor, tracking steps; being mindful of eating, portions; limit high fat foods, discussed calories per gram in carbs, proteins, fats; limit foods from cows and pigs; continue metformin, refill sent; she is welcome to return here in 3 months or she can return to the healthy weight and wellness clinic; encouragement given

## 2018-05-22 MED FILL — NORG-EE 0.18-0.215-0.25/0.0: 0.18/0.215/ | 84 days supply | Qty: 84 | Fill #3

## 2018-07-30 ENCOUNTER — Other Ambulatory Visit: Payer: Self-pay | Admitting: Family Medicine

## 2018-07-30 DIAGNOSIS — E8881 Metabolic syndrome: Secondary | ICD-10-CM

## 2018-07-31 NOTE — Telephone Encounter (Signed)
She is not due for any of these medicines If using a new pharmacy, they should transfer the prescriptions

## 2018-07-31 NOTE — Telephone Encounter (Signed)
Pt.notified

## 2018-07-31 NOTE — Telephone Encounter (Signed)
I'm not going to prescribe any more caffeine for patient If she has issues with daytime somnolence or shift work disorder, please refer her to neurologist

## 2018-09-20 ENCOUNTER — Encounter: Payer: Self-pay | Admitting: Family Medicine

## 2018-09-23 ENCOUNTER — Other Ambulatory Visit: Payer: Self-pay | Admitting: Nurse Practitioner

## 2018-09-23 DIAGNOSIS — E8881 Metabolic syndrome: Secondary | ICD-10-CM

## 2018-09-23 MED ORDER — CITALOPRAM HYDROBROMIDE 10 MG PO TABS: 10.0000 mg | ORAL_TABLET | Freq: Every day | ORAL | 3 refills | Status: DC

## 2018-09-23 MED ORDER — METFORMIN HCL 500 MG PO TABS: 500.0000 mg | ORAL_TABLET | Freq: Every day | ORAL | 5 refills | Status: DC

## 2018-09-23 MED ORDER — NORGESTIM-ETH ESTRAD TRIPHASIC 0.18/0.215/0.25 MG-25 MCG PO TABS: 1.0000 | ORAL_TABLET | Freq: Every day | ORAL | 11 refills | Status: DC

## 2018-09-27 ENCOUNTER — Encounter: Payer: Self-pay | Admitting: Family Medicine

## 2018-10-06 ENCOUNTER — Other Ambulatory Visit: Payer: Self-pay | Admitting: Nurse Practitioner

## 2018-10-06 MED ORDER — NORGESTIM-ETH ESTRAD TRIPHASIC 0.18/0.215/0.25 MG-25 MCG PO TABS: 1.0000 | ORAL_TABLET | Freq: Every day | ORAL | 11 refills | Status: DC

## 2018-12-03 ENCOUNTER — Encounter: Payer: Self-pay | Admitting: Family Medicine

## 2018-12-03 ENCOUNTER — Other Ambulatory Visit: Payer: Self-pay | Admitting: Nurse Practitioner

## 2018-12-03 MED ORDER — CITALOPRAM HYDROBROMIDE 10 MG PO TABS: 10.0000 mg | ORAL_TABLET | Freq: Every day | ORAL | 3 refills | Status: DC

## 2018-12-24 ENCOUNTER — Encounter: Payer: No Typology Code available for payment source | Admitting: Family Medicine

## 2019-01-08 ENCOUNTER — Other Ambulatory Visit: Payer: Self-pay | Admitting: Family Medicine

## 2019-01-08 DIAGNOSIS — E8881 Metabolic syndrome: Secondary | ICD-10-CM

## 2019-05-18 ENCOUNTER — Other Ambulatory Visit: Payer: Self-pay

## 2019-05-18 DIAGNOSIS — Z20822 Contact with and (suspected) exposure to covid-19: Secondary | ICD-10-CM

## 2019-05-20 LAB — NOVEL CORONAVIRUS, NAA: SARS-CoV-2, NAA: NOT DETECTED

## 2020-03-29 IMAGING — US US PELVIS COMPLETE
1 series · 15 of 25 positions shown · non-contrast
Comparison: None.

CLINICAL DATA: Dysfunctional uterine bleeding.

EXAM:
TRANSABDOMINAL ULTRASOUND OF PELVIS
TECHNIQUE: Transabdominal ultrasound examination of the pelvis was performed
including evaluation of the uterus, ovaries, adnexal regions, and
pelvic cul-de-sac.

[Series 1: us pelvis complete · 31 acquisitions, 15 frames shown]
[im 1/31]
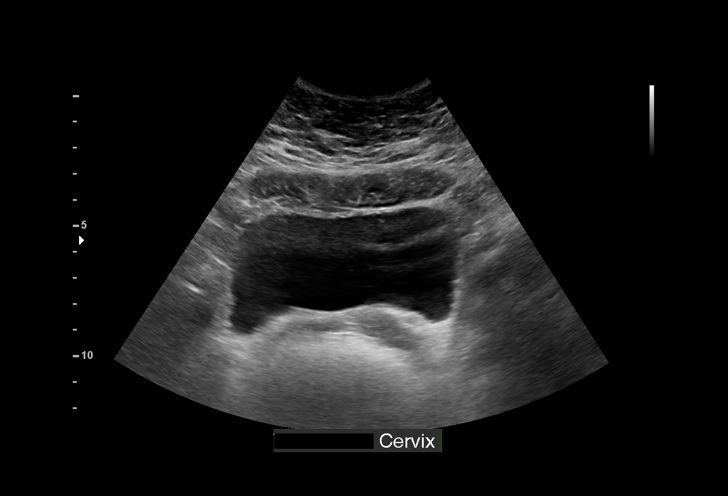
[im 3/31]
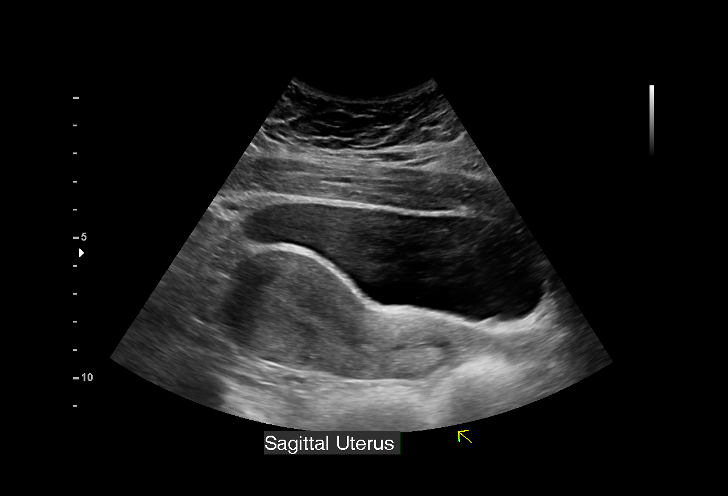
[im 6/31]
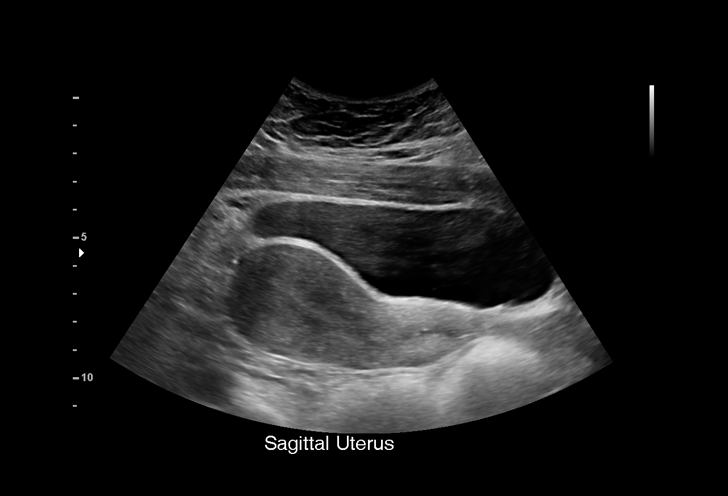
[im 7/31]
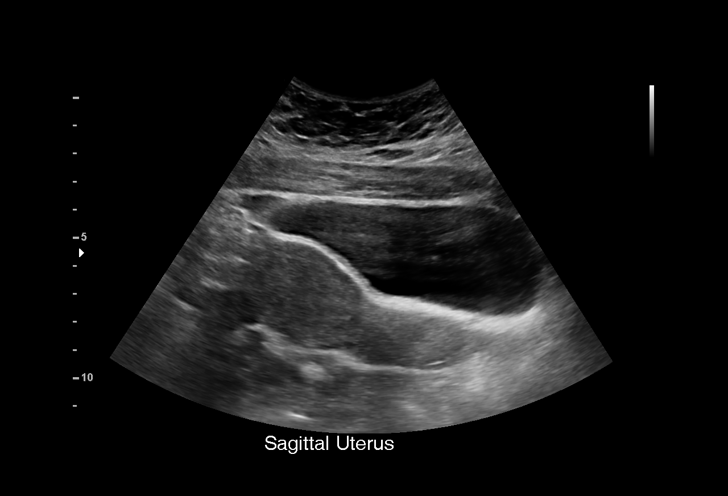
[im 9/31]
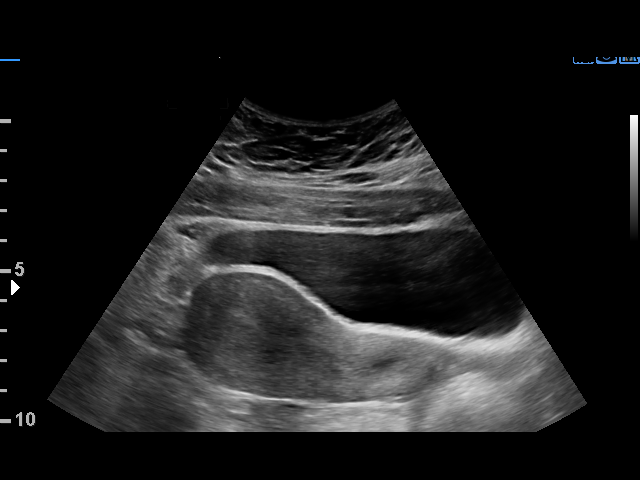
[im 12/31]
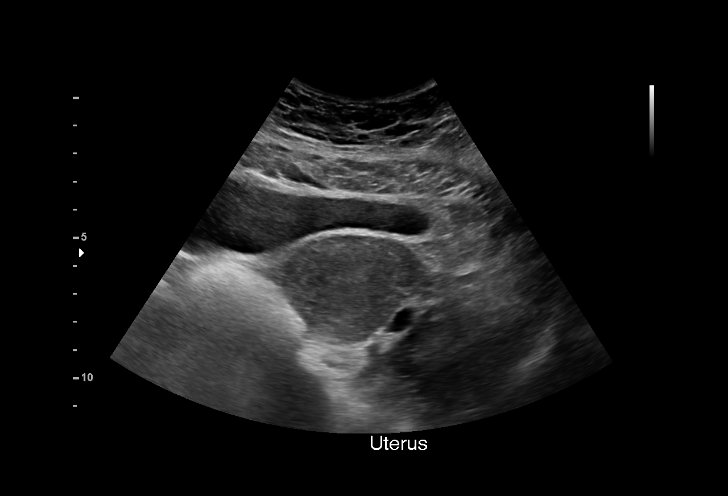
[im 13/31]
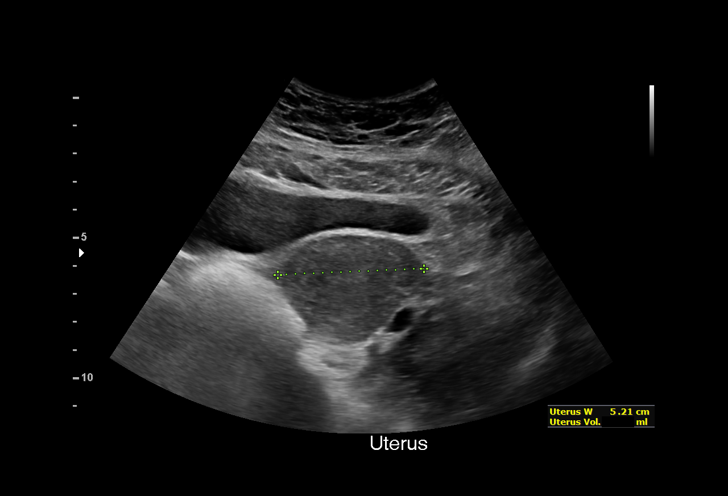
[im 16/31]
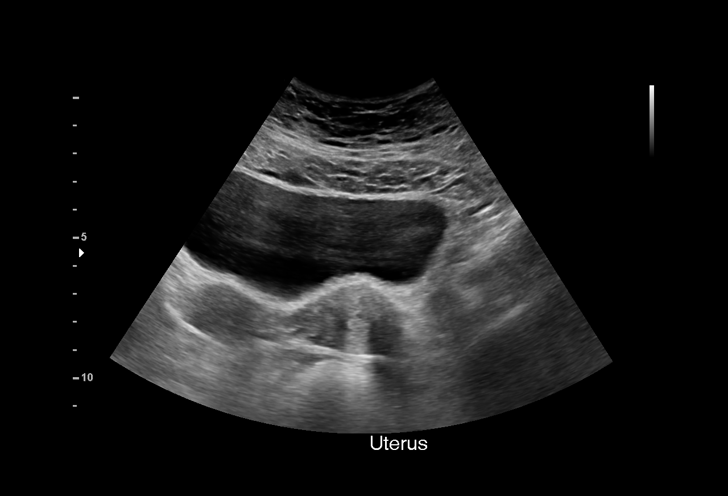
[im 18/31]
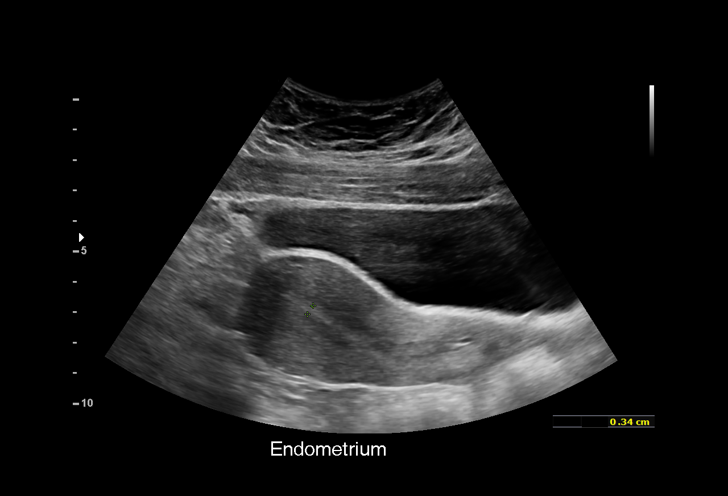
[im 19/31]
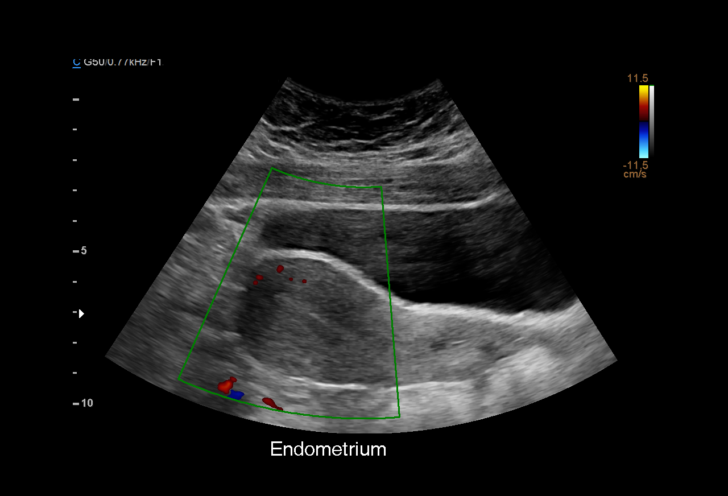
[im 22/31]
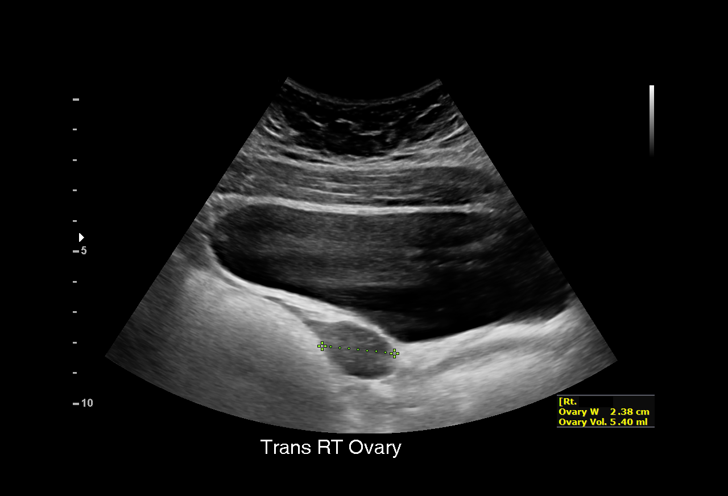
[im 24/31]
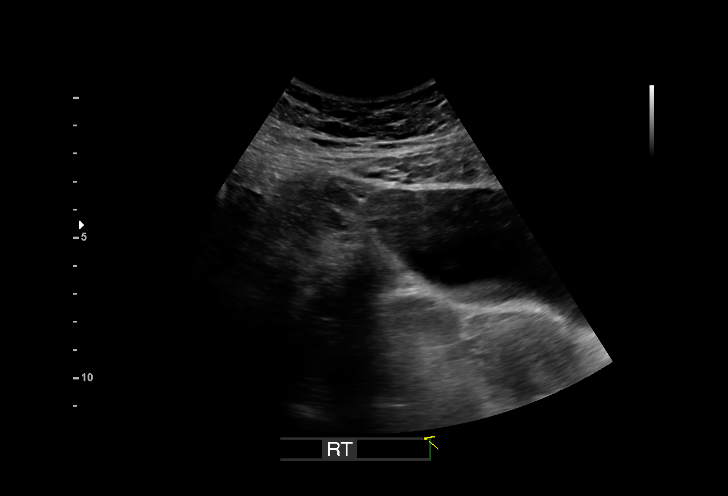
[im 26/31]
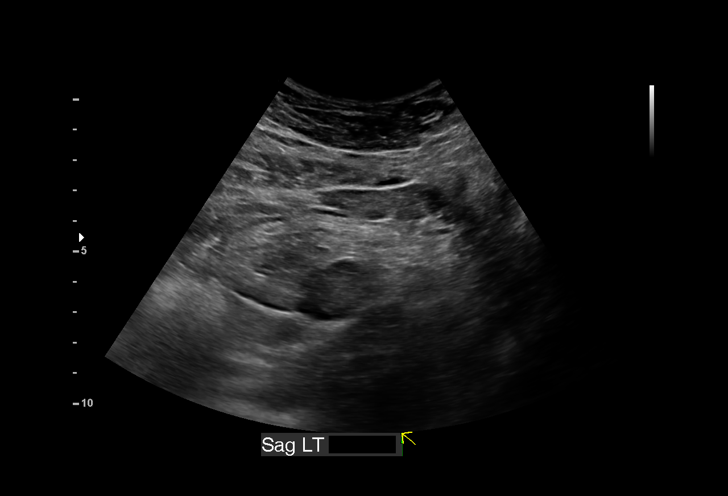
[im 28/31]
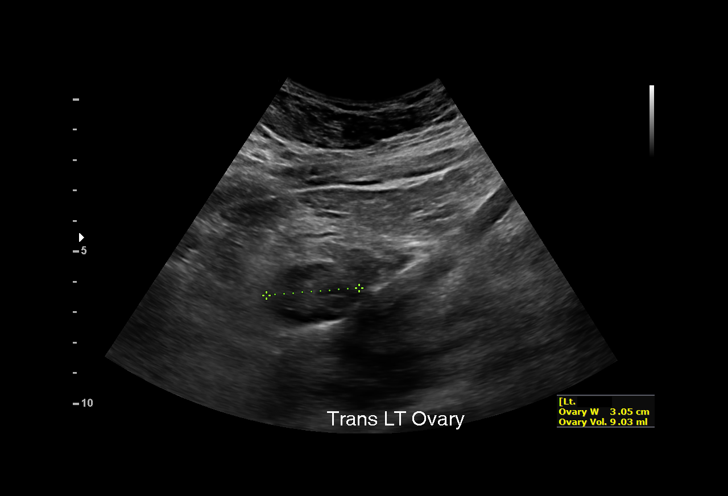
[im 31/31]
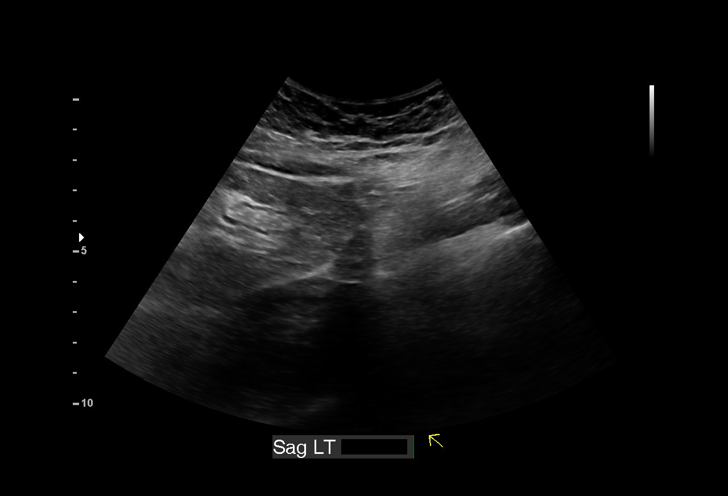

[15 of 25 positions shown; findings below may reference images not displayed]

FINDINGS: Uterus

Measurements: 8.3 x 4.0 x 5.2 cm. No fibroids or other mass
visualized.

Endometrium

Thickness: 3.4 mm.  No focal abnormality visualized.

Right ovary

Measurements: 2.5 x 1.7 x 2.4 cm. Normal appearance/no adnexal mass.

Left ovary

Measurements: 2.9 x 2.0 x 3.1 cm. Normal appearance/no adnexal mass.

Other findings:  No abnormal free fluid.
IMPRESSION: 1. The patient refused endovaginal imaging. Within visualized
limits, no abnormalities noted. If bleeding remains unresponsive to
hormonal or medical therapy, sonohysterogram should be considered
for focal lesion work-up. (Ref: Radiological Reasoning: Algorithmic
Workup of Abnormal Vaginal Bleeding with Endovaginal Sonography and
Sonohysterography. AJR [id number]; [id number])

## 2020-12-20 DIAGNOSIS — D135 Benign neoplasm of extrahepatic bile ducts: Secondary | ICD-10-CM | POA: Insufficient documentation

## 2021-02-24 HISTORY — PX: CHOLECYSTECTOMY, LAPAROSCOPIC: SHX56

## 2022-01-03 LAB — RESULTS CONSOLE HPV: CHL HPV: NEGATIVE

## 2022-01-03 LAB — HM PAP SMEAR: HM Pap smear: NORMAL

## 2022-02-26 DIAGNOSIS — K219 Gastro-esophageal reflux disease without esophagitis: Secondary | ICD-10-CM | POA: Insufficient documentation

## 2023-01-11 LAB — HM MAMMOGRAPHY

## 2023-01-15 DIAGNOSIS — Z0289 Encounter for other administrative examinations: Secondary | ICD-10-CM

## 2023-01-16 ENCOUNTER — Encounter (INDEPENDENT_AMBULATORY_CARE_PROVIDER_SITE_OTHER): Payer: 59 | Admitting: Family Medicine

## 2023-01-25 ENCOUNTER — Ambulatory Visit: Payer: 59 | Admitting: Internal Medicine

## 2023-02-13 ENCOUNTER — Ambulatory Visit (INDEPENDENT_AMBULATORY_CARE_PROVIDER_SITE_OTHER): Payer: BC Managed Care – PPO | Admitting: Family Medicine

## 2023-02-13 ENCOUNTER — Encounter (INDEPENDENT_AMBULATORY_CARE_PROVIDER_SITE_OTHER): Payer: Self-pay | Admitting: Family Medicine

## 2023-02-13 VITALS — BP 109/69 | HR 71 | Temp 98.2°F | Ht 63.0 in | Wt 228.0 lb

## 2023-02-13 DIAGNOSIS — F39 Unspecified mood [affective] disorder: Secondary | ICD-10-CM | POA: Diagnosis not present

## 2023-02-13 DIAGNOSIS — E559 Vitamin D deficiency, unspecified: Secondary | ICD-10-CM | POA: Diagnosis not present

## 2023-02-13 DIAGNOSIS — E88819 Insulin resistance, unspecified: Secondary | ICD-10-CM | POA: Diagnosis not present

## 2023-02-13 DIAGNOSIS — K219 Gastro-esophageal reflux disease without esophagitis: Secondary | ICD-10-CM | POA: Diagnosis not present

## 2023-02-13 DIAGNOSIS — Z1331 Encounter for screening for depression: Secondary | ICD-10-CM

## 2023-02-13 DIAGNOSIS — Z6841 Body Mass Index (BMI) 40.0 and over, adult: Secondary | ICD-10-CM

## 2023-02-13 DIAGNOSIS — R5383 Other fatigue: Secondary | ICD-10-CM

## 2023-02-13 DIAGNOSIS — R0602 Shortness of breath: Secondary | ICD-10-CM | POA: Diagnosis not present

## 2023-02-13 NOTE — Progress Notes (Signed)
Leah Brown, D.O.  ABFM, ABOM Specializing in Clinical Bariatric Medicine Office located at: [address] [place], [place]  [address]     Bariatric Medicine Visit  Dear No ref. provider found or Pcp, No,   Thank you for referring Leah Brown to our clinic today for evaluation.  We performed a consultation to discuss her options for treatment and educate the patient on her disease state.  The following note includes my evaluation and treatment recommendations.   Please do not hesitate to reach out to me directly if you have any further concerns.    Assessment and Plan:   Orders Placed This Encounter  Procedures   CBC with Differential/Platelet   VITAMIN D 25 Hydroxy (Vit-D Deficiency, Fractures)   Comprehensive metabolic panel   Folate   Hemoglobin A1c   Insulin, random   Lipid Panel With LDL/HDL Ratio   T4, free   TSH   Vitamin B12   EKG 12-Lead    Medications Discontinued During This Encounter  Medication Reason   Norgestimate-Ethinyl Estradiol Triphasic (ORTHO TRI-CYCLEN LO) 0.18/0.215/0.25 MG-25 MCG tab    psyllium (METAMUCIL) 58.6 % powder      1) Fatigue Assessment: Condition is new Leah Brown does feel that her weight is causing her energy to be lower than it should be. Fatigue may be related to obesity, depression or many other causes. she does not appear to have any red flag symptoms and this appears to most likely be related to her current lifestyle habits and dietary intake.  Plan:  Labs will be ordered and reviewed with her at their next office visit in two weeks. Epworth sleepiness scale score appears to be within normal limits.  Her ESS score is 9.   Leah Brown admits to daytime somnolence and admits to waking up still tired. Patient has a history of symptoms of morning headache. Leah Brown generally gets  5-8  hours of sleep per night, and states that she has difficulty falling asleep. Snoring is present. Apneic episodes are not present.  ECG:  Performed and reviewed/ interpreted independently.  Normal sinus rhythm, rate 80bpm; reassuring without any acute abnormalities, will continue to monitor for symptoms  Modified PHQ-9 Depression Screen: Her Food and Mood (modified PHQ-9) score was 15. In the meanwhile, Leah Brown will focus on self care including making healthy food choices by following their meal plan, improving sleep quality and focusing on stress reduction.  Once we are assured she is on an appropriate meal plan, we will start discussing exercise to increase cardiovascular fitness levels.    2) Shortness of breath on exertion Assessment: Condition is new Leah Brown does feel that she gets out of breath more easily than she used to when she exercises and seems to be worsening over time with weight gain.  This has gotten worse recently. [name] denies shortness of breath at rest or orthopnea. Leah Brown's shortness of breath appears to be obesity related and exercise induced, as they do not appear to have any "red flag" symptoms/ concerns today.  Also, this condition appears to be related to a state of poor cardiovascular conditioning   Plan:  Obtain labs today and will be reviewed with her at their next office visit in two weeks. Indirect Calorimeter completed today to help guide our dietary regimen. It shows a VO2 of 253 and a REE of 1742.  Her calculated basal metabolic rate is 6213 thus her measured basal metabolic rate is  slightly worse  than expected. Patient agreed to  work on weight loss at this time.  As Leah Brown progresses through our weight loss program, we will gradually increase exercise as tolerated to treat her current condition.   If Leah Brown follows our recommendations and loses 5-10% of their weight without improvement of her shortness of breath or if at any time, symptoms become more concerning, they agree to urgently follow up with their PCP/ specialist for further consideration/ evaluation.   Leah Brown verbalizes agreement with this plan.     Vitamin D deficiency Assessment: Condition is Not at goal.. Her vitamin D levels are not within the recommended range, her vitamin D level is low at 39.0. She takes a multi-vitamin daily and is tolerating this well.  Lab Results  Component Value Date   VD25OH 39.0 03/05/2018   VD25OH 28.3 (L) 10/31/2017   VD25OH 28 (L) 12/13/2016   Plan: - Continue with her multivitamin tablet daily.   - I discussed the importance of vitamin D to the patient's health and well-being as well as to their ability to lose weight.   - weight loss will likely improve availability of vitamin D, thus encouraged Leah Brown to continue with meal plan and their weight loss efforts to further improve this condition.  Thus, we will need to monitor levels regularly (every 3-4 mo on average) to keep levels within normal limits and prevent over supplementation.   Insulin resistance Assessment: Condition is Not at goal. This is controlled with Metformin and does have GI upset. Her A1c is within the recommended range however, her insulin is elevated at 11.9. She endorses having hunger and cravings. Pt tends to overeat as she is fearful her blood sugar will drop so she consistently snacks/eat.  Lab Results  Component Value Date   HGBA1C 5.2 03/05/2018   HGBA1C 5.2 10/31/2017   INSULIN 11.9 03/05/2018   INSULIN 20.2 10/31/2017    Plan:  - Continue Metformin 500mg  daily with breakfast.   - Follow the prescribed nutritional meal plan as directed by Dr. Val Eagle.   - Stressed importance of dietary and lifestyle modifications to result in weight loss as first line txmnt.  - Continue to decrease simple carbs/ sugars; increase fiber and proteins. I explained role of simple carbs and insulin levels on hunger and cravings  - Anticipatory guidance given.    - Leah Brown will continue to work on weight loss, exercise, via their meal plan we devised to help decrease the risk of progressing to diabetes.   - We will recheck A1c and fasting  insulin level in approximately 3 months from last check, or as deemed appropriate.    Mood disorder (HCC)- Emotional eating Assessment: Condition is Improving, but not optimized.. Denies any SI/HI. Mood is stable. She currently takes Celexa for her mood and is tolerating this well. She did previously have a counselor but does not actively see her at this time.  Cravings and hunger are not well controlled. She tends to over eat as she is fearful her blood sugar will drop and has the constant feeling to eat. Pt eats when stressed, as a reward, and to stay awake. She does feel some guilt when she overeats/makes poor food choices.   Plan: - Continue to take Celexa 10mg  as directed.   - Patient was referred to Dr. Dewaine Conger, our Bariatric Psychologist, for evaluation due to her elevated PHQ-9 score and significant struggles with emotional eating. She has agreed to see her in the future.   - Discussed the importance of not skipping meals and getting  all her proteins and fiber in on a daily basis discussed.    - Reminded patient of the importance of following their prudent nutrition plan and how food can affect mood as well to support emotional wellbeing.   - We will continue to monitor closely alongside Dr. Dewaine Conger.    Gastroesophageal reflux disease, unspecified whether esophagitis present Assessment: Condition is Not optimized. She was advised to take Omeprazole daily to help alleviate her condition. She sleeps in a recliner as her GERD is very bad and disturbs her sleep.   Plan: - Continue Omeprazole 20mg  daily.  - I advised her to not drink or eat within 2-3hrs of laying down.   - I encouraged her to see her gastroenterologist and inquire about other options to not take as much medicine and potentially a  Nissen fundoplication operation.   - I informed her that coffee is not the best for GERD and advised no caffeine past noon.    TREATMENT PLAN FOR OBESITY: Morbid obesity (HCC)-starting BMI  02/13/23 40.4 Assessment: Condition is Not optimized.. Biometric data collected today, was reviewed with patient.  Muscle mass is 117.2lb. Fat mass is 104.8lb.Total body water is 89lb.   Plan:  Leah Brown is currently in the action stage of change. As such, her goal is to continue weight management plan. Damary will work on healthier eating habits and try their best to follow the Category 2 meal plan with 100 snack calories best they can.    Behavioral Intervention Additional resources provided today: category 2 meal plan information Evidence-based interventions for health behavior change were utilized today including the discussion of self monitoring techniques, problem-solving barriers and SMART goal setting techniques.   Regarding patient's less desirable eating habits and patterns, we employed the technique of small changes.  Pt will specifically work on: Cutting her caffeine intake in half for next visit.    Recommended Physical Activity Goals Verita has been advised to gradually work up to 150 minutes of moderate intensity aerobic activity a week and strengthening exercises 2-3 times per week for cardiovascular health, weight loss maintenance and preservation of muscle mass.  She has agreed to continue their current level of activity  FOLLOW UP: Follow up in 2 weeks. She was informed of the importance of frequent follow up visits to maximize her success with intensive lifestyle modifications for her multiple health conditions.  Leah Brown is aware that we will review all of her lab results at our next visit.  She is aware that if anything is critical/ life threatening with the results, we will be contacting her via MyChart prior to the office visit to discuss management. Marilea Hurst Kuczek is aware that we will review all of her lab results at our next visit together in person.  She is aware that if anything is critical/ life threatening with the results, we will be contacting her  via MyChart or by my CMA will be calling them prior to the office visit to discuss acute management.     Chief Complaint:   OBESITY Leah Brown (MR# [id number]) is a 42 y.o. female who presents for evaluation and treatment of obesity and related comorbidities. Current BMI is Body mass index is 40.39 kg/m. Leah Brown has been struggling with her weight for many years and has been unsuccessful in either losing weight, maintaining weight loss, or reaching her healthy weight goal.  Leah Brown is currently in the action stage of change and ready to dedicate time achieving and maintaining  a healthier weight. Ivey is interested in becoming our patient and working on intensive lifestyle modifications including (but not limited to) diet and exercise for weight loss.  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  Patient is married to Leah Brown  and does not have children. She lives with her husband.  Leah Brown's habits were reviewed today and are as follows: her desired weight loss is is to be 160-170lbs and does not have a specific time frame. Pt wants to loss weight to feel better, have a better quality of life, improve her GERD, and hot flashes. She currently does not regularly exercise and eats out 3-5 times a week and picks up fast food 3 times a week. She tends to skip lunch and breakfast 3-4 days a week. Pt has tried the keto diet and counting calories but feels as the time she had her gallstone removed was the best "diet" as she was in so much pain she couldn't eat and lost 20lbs in a month. Pt does like to cook but does not have the time to. She tends to crave fatty foods, cheese, pastries, potatoes, and margaritas and tends to snack on cookies, pastries, crackers, and cheese sticks. Pt typically drinks 1 soda regularly daily, coffee throughout her day with sugar, milk, and flavorings and  smoothies and margaritas once a week. She feels as her worst eating habit is over eating and drinking calories.      Subjective:   This is the patient's first visit at [facility].  The patient's NEW PATIENT PACKET that they filled out prior to today's office visit was reviewed at length and information from that paperwork was included within the following office visit note.    Included in the packet: current and past health history, medications, allergies, ROS, gynecologic history (women only), surgical history, family history, social history, weight history, weight loss surgery history (for those that have had weight loss surgery), nutritional evaluation, mood and food questionnaire along with a depression screening (PHQ9) on all patients, an Epworth questionnaire, sleep habits questionnaire, patient life and health improvement goals questionnaire. These will all be scanned into the patient's chart under the "media" tab.   Review of Systems: Please refer to new patient packet scanned into media. Pertinent positives were addressed with patient today.  Reviewed by clinician on day of visit: allergies, medications, problem list, medical history, surgical history, family history, social history, and previous encounter notes.  During the visit, I independently reviewed the patient's EKG, bioimpedance scale results, and indirect calorimeter results. I used this information to tailor a meal plan for the patient that will help Jaunita Carin Brown to lose weight and will improve her obesity-related conditions going forward.  I performed a medically necessary appropriate examination and/or evaluation. I discussed the assessment and treatment plan with the patient. The patient was provided an opportunity to ask questions and all were answered. The patient agreed with the plan and demonstrated an understanding of the instructions. Labs were ordered today (unless patient declined them) and will  be reviewed with the patient at our next visit unless more critical results need to be addressed immediately. Clinical information was updated and documented in the EMR.   Objective:   PHYSICAL EXAM: Blood pressure 109/69, pulse 71, temperature 98.2 F (36.8 C), height 5\' 3"  (1.6 m), weight 228 lb (103.4 kg), SpO2 98%. Body mass index is 40.39 kg/m.  General: Well Developed, well nourished, and in no acute distress.  HEENT: Normocephalic, atraumatic Skin: Warm and dry, cap RF less 2 sec, good turgor Chest:  Normal excursion, shape, no gross abn Respiratory: speaking in full sentences, no conversational dyspnea NeuroM-Sk: Ambulates w/o assistance, moves * 4 Psych: A and O *3, insight good, mood-full  Anthropometric Measurements Height: 5\' 3"  (1.6 m) Weight: 228 lb (103.4 kg) BMI (Calculated): 40.4 Weight at Last Visit: na Weight Lost Since Last Visit: na Weight Gained Since Last Visit: na Starting Weight: 228lb Total Weight Loss (lbs): 0 lb (0 kg) Peak Weight: 235lb Waist Measurement : 43 inches   Body Composition  Body Fat %: 45.9 % Fat Mass (lbs): 104.8 lbs Muscle Mass (lbs): 117.2 lbs Total Body Water (lbs): 89 lbs Visceral Fat Rating : 13   Other Clinical Data RMR: 1742 Fasting: yes Labs: yes Today's Visit #: 1 Starting Date: 02/13/23 Comments: first visit    DIAGNOSTIC DATA REVIEWED:  BMET    Component Value Date/Time   NA 140 03/05/2018 0835   NA 138 06/11/2013 1014   K 4.1 03/05/2018 0835   K 3.8 06/11/2013 1014   CL 102 03/05/2018 0835   CL 102 06/11/2013 1014   CO2 22 03/05/2018 0835   CO2 27 06/11/2013 1014   GLUCOSE 88 03/05/2018 0835   GLUCOSE 96 06/25/2017 1205   GLUCOSE 96 06/11/2013 1014   BUN 14 03/05/2018 0835   BUN 11 06/11/2013 1014   CREATININE 0.79 03/05/2018 0835   CREATININE 0.72 06/25/2017 1205   CALCIUM 8.9 03/05/2018 0835   CALCIUM 8.7 06/11/2013 1014   GFRNONAA 97 03/05/2018 0835   GFRNONAA 108 06/25/2017 1205   GFRAA 111  03/05/2018 0835   GFRAA 126 06/25/2017 1205   Lab Results  Component Value Date   HGBA1C 5.2 03/05/2018   HGBA1C 5.2 10/31/2017   Lab Results  Component Value Date   INSULIN 11.9 03/05/2018   INSULIN 20.2 10/31/2017   Lab Results  Component Value Date   TSH 1.730 10/31/2017   CBC    Component Value Date/Time   WBC 8.8 10/31/2017 0919   WBC 8.4 06/25/2017 1205   RBC 4.58 10/31/2017 0919   RBC 4.51 06/25/2017 1205   HGB 13.8 10/31/2017 0919   HCT 42.2 10/31/2017 0919   PLT 259 06/25/2017 1205   PLT 296 12/09/2015 0830   MCV 92 10/31/2017 0919   MCV 91 06/11/2013 1014   MCH 30.1 10/31/2017 0919   MCH 30.4 06/25/2017 1205   MCHC 32.7 10/31/2017 0919   MCHC 33.7 06/25/2017 1205   RDW 13.7 10/31/2017 0919   RDW 12.5 06/11/2013 1014   Iron Studies No results found for: "IRON", "TIBC", "FERRITIN", "IRONPCTSAT" Lipid Panel     Component Value Date/Time   CHOL 211 (H) 03/05/2018 0835   CHOL 175 06/11/2013 1014   TRIG 132 03/05/2018 0835   TRIG 52 06/11/2013 1014   HDL 53 03/05/2018 0835   HDL 42 06/11/2013 1014   CHOLHDL 3.5 12/13/2016 0902   VLDL 13 12/13/2016 0902   VLDL 10 06/11/2013 1014   LDLCALC 132 (H) 03/05/2018 0835   LDLCALC 123 (H) 06/11/2013 1014   Hepatic Function Panel     Component Value Date/Time   PROT 6.8 03/05/2018 0835   PROT 7.3 06/11/2013 1014   ALBUMIN 4.1 03/05/2018 0835   ALBUMIN 3.9 06/11/2013 1014   AST 25 03/05/2018 0835   AST 13 (L) 06/11/2013 1014   ALT 28 03/05/2018 0835  ALT 21 06/11/2013 1014   ALKPHOS 51 03/05/2018 0835   ALKPHOS 52 06/11/2013 1014   BILITOT 0.4 03/05/2018 0835   BILITOT 0.4 06/11/2013 1014      Component Value Date/Time   TSH 1.730 10/31/2017 0919   Nutritional Lab Results  Component Value Date   VD25OH 39.0 03/05/2018   VD25OH 28.3 (L) 10/31/2017   VD25OH 28 (L) 12/13/2016    Attestation Statements:   I, Clinical biochemist, acting as a Stage manager for Thomasene Lot, DO., have compiled  all relevant documentation for today's office visit on behalf of Thomasene Lot, DO, while in the presence of [employer], DO.  Time spent on visit including pre-visit chart review and post-visit care was estimated to be 60 minutes. Over 50% of the time was spent in direct face to face counseling and coordination of care.  I have reviewed the above documentation for accuracy and completeness, and I agree with the above. Leah Brown, D.O.  The 21st Century Cures Act was signed into law in 2016 which includes the topic of electronic health records.  This provides immediate access to information in MyChart.  This includes consultation notes, operative notes, office notes, lab results and pathology reports.  If you have any questions about what you read please let us know at your next visit so we can discuss your concerns and take corrective action if need be.  We are right here with you.

## 2023-02-27 ENCOUNTER — Encounter (INDEPENDENT_AMBULATORY_CARE_PROVIDER_SITE_OTHER): Payer: Self-pay | Admitting: Family Medicine

## 2023-02-27 ENCOUNTER — Ambulatory Visit (INDEPENDENT_AMBULATORY_CARE_PROVIDER_SITE_OTHER): Payer: BC Managed Care – PPO | Admitting: Family Medicine

## 2023-02-27 VITALS — BP 105/71 | HR 64 | Temp 99.0°F | Ht 63.0 in | Wt 226.0 lb

## 2023-02-27 DIAGNOSIS — E88819 Insulin resistance, unspecified: Secondary | ICD-10-CM | POA: Diagnosis not present

## 2023-02-27 DIAGNOSIS — E559 Vitamin D deficiency, unspecified: Secondary | ICD-10-CM

## 2023-02-27 DIAGNOSIS — F39 Unspecified mood [affective] disorder: Secondary | ICD-10-CM | POA: Diagnosis not present

## 2023-02-27 DIAGNOSIS — Z6841 Body Mass Index (BMI) 40.0 and over, adult: Secondary | ICD-10-CM

## 2023-02-27 MED ORDER — VITAMIN D (ERGOCALCIFEROL) 1.25 MG (50000 UNIT) PO CAPS: 50000.0000 [IU] | ORAL_CAPSULE | ORAL | 0 refills | Status: DC

## 2023-02-27 NOTE — Progress Notes (Signed)
Leah Brown, D.O.  ABFM, ABOM Clinical Bariatric Medicine Physician  Office located at: [address] [place], [place]  [address]     Assessment and Plan:   Meds ordered this encounter  Medications   Vitamin D, Ergocalciferol, (DRISDOL) 1.25 MG (50000 UNIT) CAPS capsule    Sig: Take 1 capsule (50,000 Units total) by mouth every 7 (seven) days.    Dispense:  4 capsule    Refill:  0    Vitamin D deficiency Assessment: Condition is Not at goal.. Her vitamin D levels dropped from 39.0 to 24.1 as of 02/13/2023. She only takes a multivitamin daily. Her B-12 levels are optimal.  Lab Results  Component Value Date   VD25OH 24.1 (L) 02/13/2023   VD25OH 39.0 03/05/2018   VD25OH 28.3 (L) 10/31/2017   Lab Results  Component Value Date   VITAMINB12 645 02/13/2023   Plan: - Begin Ergocalciferol 50K IU weekly.   - I discussed the importance of vitamin D to the patient's health and well-being as well as to their ability to lose weight.   - It has been show that administration of vitamin D supplementation leads to improved satiety and a decrease in inflammatory markers.  Hence, low Vitamin D levels may be linked to an increased risk of cardiovascular events and even increased risk of cancers- such as colon and breast.  - ideal vitamin D levels reviewed with patient    - weight loss will likely improve availability of vitamin D, thus encouraged Leah Brown to continue with meal plan and their weight loss efforts to further improve this condition.  Thus, we will need to monitor levels regularly (every 3-4 mo on average) to keep levels within normal limits and prevent over supplementation.  Labs were reviewed with patient today and education provided on them. We discussed how the foods patient eats may influence these laboratory findings.  All of the patient's questions about them were answered    Mood disorder [facility])- Emotional eating Assessment:  Denies any SI/HI. Mood is  stable. Cravings and hunger are well controlled. She continues to meet with Dr. Dewaine Conger and feels as their meetings are going well. She continues Celexa with no difficulties.   Plan: - Continue with Celexa 10mg  daily.   - Continue to meet with Dr. Dewaine Conger, our Bariatric Psychologist as needed.   - We reviewed the importance of following their prudent nutrition plan and how food can affect mood as well to support emotional wellbeing.   - We will continue to monitor closely alongside PCP / other specialists.   Insulin resistance Assessment: Condition is Not at goal. She endorses controlled hunger and cravings. Her A1c levels have increased from 5.2 to 5.7 and her insulin increased from 11.9 to 19.7 on 02/13/2023. Since she restarted with Metformin 500mg  daily and no longer experiences any GI upset or adverse side effects. She has an elevated LDL of 127. Her thyroid levels are optimal.  Lab Results  Component Value Date   HGBA1C 5.7 (H) 02/13/2023   HGBA1C 5.2 03/05/2018   HGBA1C 5.2 10/31/2017   INSULIN 19.7 02/13/2023   INSULIN 11.9 03/05/2018   INSULIN 20.2 10/31/2017   Lab Results  Component Value Date   CREATININE 0.79 02/13/2023   BUN 8 02/13/2023   NA 139 02/13/2023   K 4.5 02/13/2023   CL 102 02/13/2023   CO2 22 02/13/2023      Component Value Date/Time   PROT 7.0 02/13/2023 0859   PROT 7.3 06/11/2013  1014   ALBUMIN 4.3 02/13/2023 0859   ALBUMIN 3.9 06/11/2013 1014   AST 17 02/13/2023 0859   AST 13 (L) 06/11/2013 1014   ALT 20 02/13/2023 0859   ALT 21 06/11/2013 1014   ALKPHOS 59 02/13/2023 0859   ALKPHOS 52 06/11/2013 1014   BILITOT 0.3 02/13/2023 0859   BILITOT 0.4 06/11/2013 1014   Lab Results  Component Value Date   WBC 7.1 02/13/2023   HGB 14.0 02/13/2023   HCT 42.7 02/13/2023   MCV 93 02/13/2023   PLT 289 02/13/2023   Lab Results  Component Value Date   CHOL 201 (H) 02/13/2023   HDL 55 02/13/2023   LDLCALC 127 (H) 02/13/2023   TRIG 109 02/13/2023    CHOLHDL 3.5 12/13/2016   Lab Results  Component Value Date   TSH 1.630 02/13/2023   T3TOTAL 124 10/31/2017    Plan: -  Continue Metformin at current dose. She denies need for refill.   - I explained that an excess in calories and carbs will lead to stored fat on the body. Continue to decrease simple carbs/ sugars; increase fiber and proteins -> follow her meal plan.    - Handouts provided at pt's request after education provided.  All concerns/questions addressed.    - Anticipatory guidance given.    - We will recheck levels in approximately 3 months from last check, or as deemed appropriate.   Labs were reviewed with patient today and education provided on them. We discussed how the foods patient eats may influence these laboratory findings.  All of the patient's questions about them were answered    TREATMENT PLAN FOR OBESITY: Morbid obesity (HCC)-starting BMI 02/13/23 40.04 Assessment:  Leah Brown is here to discuss her progress with her obesity treatment plan along with follow-up of her obesity related diagnoses. See Medical Weight Management Flowsheet for complete bioelectrical impedance results.  Condition is docourse: improving.   Since last office visit patient's  Muscle mass has stayed the same. Fat mass has decreased by 1.8lb. Total body water has decreased by 1.8lb.  Counseling done on how various foods will affect these numbers and how to maximize success  Total lbs lost to date: 2lbs Total weight loss percentage to date: 0.88%   Plan: -  Adhere to the Category 2 Plan with 100 snack calories only.   - I advised that she meal prep in the future for her busy days.  - I recommended [email or web address] and chat GPT for healthy recipes.    Behavioral Intervention Additional resources provided today:  handout on insulin resistance and prediabetes Evidence-based interventions for health behavior change were utilized today including the discussion of self  monitoring techniques, problem-solving barriers and SMART goal setting techniques.   Regarding patient's less desirable eating habits and patterns, we employed the technique of small changes.  Pt will specifically work on: Follow the prescribed nutritional plan more efficiently for next visit.   FOLLOW UP: Return in about 2 weeks (around 03/13/2023). She was informed of the importance of frequent follow up visits to maximize her success with intensive lifestyle modifications for her multiple health conditions.  Subjective:   Chief complaint: Obesity Leah Brown is here to discuss her progress with her obesity treatment plan. She is on the Category 2 Plan with 100 snack calories and states she is following her eating plan approximately 10 % of the time. She states she is walking 10 minutes 3 days per week.  Interval History:  Leah Brown is  here today for her first follow-up office visit since starting the program with [place].  Since last office visit she feels as she is an all or nothing" person and feels as she may need to come in more often. She struggled with following the prescribed meal plan and has lots of questions. She discovered that she does best with routine. She finds it hard to cook on her busy days. She has been journaling her food intake. She inquired if she can drink crystal light, sparkling water, or any substitute sweeteners in small quantities which are approved. She has significantly cutback on her caffeine intake and her stomach and sleep issues have improved.   All blood work/ lab tests that were recently ordered by myself or an outside provider were reviewed with patient today per their request. Extended time was spent counseling her on all new disease processes that were discovered or preexisting ones that are affected by BMI.  she understands that many of these abnormalities will need to monitored regularly along with the current treatment plan of prudent dietary changes, in  which we are making each and every office visit, to improve these health parameters.  Pharmacotherapy for weight loss: She is currently taking  Metformin  for medical weight loss.  Denies side effects.    Review of Systems:  Pertinent positives were addressed with patient today.  Weight Summary and Biometrics   Weight Lost Since Last Visit: 2lb  Weight Gained Since Last Visit: 0lb   Vitals Temp: 99 F (37.2 C) BP: 105/71 Pulse Rate: 64 SpO2: 98 %   Anthropometric Measurements Height: 5\' 3"  (1.6 m) Weight: 226 lb (102.5 kg) BMI (Calculated): 40.04 Weight at Last Visit: 228lb Weight Lost Since Last Visit: 2lb Weight Gained Since Last Visit: 0lb Starting Weight: 228lb Total Weight Loss (lbs): 2 lb (0.907 kg) Peak Weight: 235lb   Body Composition  Body Fat %: 45.5 % Fat Mass (lbs): 103 lbs Muscle Mass (lbs): 117.2 lbs Total Body Water (lbs): 87.2 lbs Visceral Fat Rating : 12   Other Clinical Data Fasting: no Labs: no Today's Visit #: 2 Starting Date: 02/13/23     Objective:   PHYSICAL EXAM:  Blood pressure 105/71, pulse 64, temperature 99 F (37.2 C), height 5\' 3"  (1.6 m), weight 226 lb (102.5 kg), SpO2 98%. Body mass index is 40.03 kg/m.  General: Well Developed, well nourished, and in no acute distress.  HEENT: Normocephalic, atraumatic Skin: Warm and dry, cap RF less 2 sec, good turgor Chest:  Normal excursion, shape, no gross abn Respiratory: speaking in full sentences, no conversational dyspnea NeuroM-Sk: Ambulates w/o assistance, moves * 4 Psych: A and O *3, insight good, mood-full  DIAGNOSTIC DATA REVIEWED:  BMET    Component Value Date/Time   NA 139 02/13/2023 0859   NA 138 06/11/2013 1014   K 4.5 02/13/2023 0859   K 3.8 06/11/2013 1014   CL 102 02/13/2023 0859   CL 102 06/11/2013 1014   CO2 22 02/13/2023 0859   CO2 27 06/11/2013 1014   GLUCOSE 100 (H) 02/13/2023 0859   GLUCOSE 96 06/25/2017 1205   GLUCOSE 96 06/11/2013 1014    BUN 8 02/13/2023 0859   BUN 11 06/11/2013 1014   CREATININE 0.79 02/13/2023 0859   CREATININE 0.72 06/25/2017 1205   CALCIUM 9.3 02/13/2023 0859   CALCIUM 8.7 06/11/2013 1014   GFRNONAA 97 03/05/2018 0835   GFRNONAA 108 06/25/2017 1205   GFRAA 111 03/05/2018 0835   GFRAA 126 06/25/2017 1205  Lab Results  Component Value Date   HGBA1C 5.7 (H) 02/13/2023   HGBA1C 5.2 10/31/2017   Lab Results  Component Value Date   INSULIN 19.7 02/13/2023   INSULIN 20.2 10/31/2017   Lab Results  Component Value Date   TSH 1.630 02/13/2023   CBC    Component Value Date/Time   WBC 7.1 02/13/2023 0859   WBC 8.4 06/25/2017 1205   RBC 4.61 02/13/2023 0859   RBC 4.51 06/25/2017 1205   HGB 14.0 02/13/2023 0859   HCT 42.7 02/13/2023 0859   PLT 289 02/13/2023 0859   MCV 93 02/13/2023 0859   MCV 91 06/11/2013 1014   MCH 30.4 02/13/2023 0859   MCH 30.4 06/25/2017 1205   MCHC 32.8 02/13/2023 0859   MCHC 33.7 06/25/2017 1205   RDW 13.2 02/13/2023 0859   RDW 12.5 06/11/2013 1014   Iron Studies No results found for: "IRON", "TIBC", "FERRITIN", "IRONPCTSAT" Lipid Panel     Component Value Date/Time   CHOL 201 (H) 02/13/2023 0859   CHOL 175 06/11/2013 1014   TRIG 109 02/13/2023 0859   TRIG 52 06/11/2013 1014   HDL 55 02/13/2023 0859   HDL 42 06/11/2013 1014   CHOLHDL 3.5 12/13/2016 0902   VLDL 13 12/13/2016 0902   VLDL 10 06/11/2013 1014   LDLCALC 127 (H) 02/13/2023 0859   LDLCALC 123 (H) 06/11/2013 1014   Hepatic Function Panel     Component Value Date/Time   PROT 7.0 02/13/2023 0859   PROT 7.3 06/11/2013 1014   ALBUMIN 4.3 02/13/2023 0859   ALBUMIN 3.9 06/11/2013 1014   AST 17 02/13/2023 0859   AST 13 (L) 06/11/2013 1014   ALT 20 02/13/2023 0859   ALT 21 06/11/2013 1014   ALKPHOS 59 02/13/2023 0859   ALKPHOS 52 06/11/2013 1014   BILITOT 0.3 02/13/2023 0859   BILITOT 0.4 06/11/2013 1014      Component Value Date/Time   TSH 1.630 02/13/2023 0859   Nutritional Lab  Results  Component Value Date   VD25OH 24.1 (L) 02/13/2023   VD25OH 39.0 03/05/2018   VD25OH 28.3 (L) 10/31/2017    Attestations:   Reviewed by clinician on day of visit: allergies, medications, problem list, medical history, surgical history, family history, social history, and previous encounter notes.   Patient was in the office today and time spent on visit including pre-visit chart review and post-visit care/coordination of care and electronic medical record documentation was 56 minutes. 50% of the time was in face to face counseling of this patient's medical condition(s) and providing education on treatment options to include the first-line treatment of diet and lifestyle modification.  I, Clinical biochemist, acting as a Stage manager for [employer], DO., have compiled all relevant documentation for today's office visit on behalf of Thomasene Lot, DO, while in the presence of [employer], DO.  I have reviewed the above documentation for accuracy and completeness, and I agree with the above. Leah Brown, D.O.  The 21st Century Cures Act was signed into law in 2016 which includes the topic of electronic health records.  This provides immediate access to information in MyChart.  This includes consultation notes, operative notes, office notes, lab results and pathology reports.  If you have any questions about what you read please let us know at your next visit so we can discuss your concerns and take corrective action if need be.  We are right here with you.

## 2023-03-06 ENCOUNTER — Encounter: Payer: Self-pay | Admitting: Internal Medicine

## 2023-03-06 ENCOUNTER — Ambulatory Visit: Payer: BC Managed Care – PPO | Admitting: Internal Medicine

## 2023-03-06 ENCOUNTER — Other Ambulatory Visit: Payer: Self-pay | Admitting: Internal Medicine

## 2023-03-06 VITALS — BP 120/68 | HR 81 | Ht 63.0 in | Wt 229.0 lb

## 2023-03-06 DIAGNOSIS — K219 Gastro-esophageal reflux disease without esophagitis: Secondary | ICD-10-CM | POA: Diagnosis not present

## 2023-03-06 DIAGNOSIS — F419 Anxiety disorder, unspecified: Secondary | ICD-10-CM | POA: Diagnosis not present

## 2023-03-06 DIAGNOSIS — Z1371 Encounter for nonprocreative screening for genetic disease carrier status: Secondary | ICD-10-CM | POA: Diagnosis not present

## 2023-03-06 DIAGNOSIS — Z6835 Body mass index (BMI) 35.0-35.9, adult: Secondary | ICD-10-CM

## 2023-03-06 DIAGNOSIS — E559 Vitamin D deficiency, unspecified: Secondary | ICD-10-CM | POA: Diagnosis not present

## 2023-03-06 DIAGNOSIS — E6609 Other obesity due to excess calories: Secondary | ICD-10-CM

## 2023-03-06 DIAGNOSIS — E282 Polycystic ovarian syndrome: Secondary | ICD-10-CM

## 2023-03-06 DIAGNOSIS — F32A Depression, unspecified: Secondary | ICD-10-CM

## 2023-03-06 NOTE — Progress Notes (Signed)
Date:  03/06/2023   Name:  Leah Brown   DOB:  06-15-1981   MRN:  [id number]   Chief Complaint: New Patient (Initial Visit) and Hand Pain (Rt wrist/thumb area. One year ago patient was carrying in groceries and the bag pulled the joint. It hurts when pressing on it, or hitting it on something. No pain with movement.)  Anxiety Presents for follow-up visit. Patient reports no chest pain, dizziness, excessive worry, hyperventilation, nervous/anxious behavior, obsessions, palpitations, shortness of breath or suicidal ideas. Symptoms occur rarely.   Compliance with medications is 76-100%.  Gastroesophageal Reflux She complains of heartburn. She reports no abdominal pain, no chest pain, no coughing or no wheezing. This is a recurrent problem. The problem occurs rarely. The symptoms are aggravated by certain foods. Pertinent negatives include no fatigue. She has tried a PPI for the symptoms.  Hyperlipidemia This is a chronic problem. Recent lipid tests were reviewed and are high. Pertinent negatives include no chest pain or shortness of breath. Current antihyperlipidemic treatment includes exercise and diet change.  PCOS - on metformin for this and pre-diabetes.  Working with [name] for weight loss.  Lab Results  Component Value Date   NA 139 02/13/2023   K 4.5 02/13/2023   CO2 22 02/13/2023   GLUCOSE 100 (H) 02/13/2023   BUN 8 02/13/2023   CREATININE 0.79 02/13/2023   CALCIUM 9.3 02/13/2023   EGFR 96 02/13/2023   GFRNONAA 97 03/05/2018   Lab Results  Component Value Date   CHOL 201 (H) 02/13/2023   HDL 55 02/13/2023   LDLCALC 127 (H) 02/13/2023   TRIG 109 02/13/2023   CHOLHDL 3.5 12/13/2016   Lab Results  Component Value Date   TSH 1.630 02/13/2023   Lab Results  Component Value Date   HGBA1C 5.7 (H) 02/13/2023   Lab Results  Component Value Date   WBC 7.1 02/13/2023   HGB 14.0 02/13/2023   HCT 42.7 02/13/2023   MCV 93 02/13/2023   PLT 289 02/13/2023   Lab  Results  Component Value Date   ALT 20 02/13/2023   AST 17 02/13/2023   ALKPHOS 59 02/13/2023   BILITOT 0.3 02/13/2023   Lab Results  Component Value Date   VD25OH 24.1 (L) 02/13/2023     Review of Systems  Constitutional:  Negative for fatigue and unexpected weight change.  HENT:  Negative for nosebleeds.   Eyes:  Negative for visual disturbance.  Respiratory:  Negative for cough, chest tightness, shortness of breath and wheezing.   Cardiovascular:  Negative for chest pain, palpitations and leg swelling.  Gastrointestinal:  Positive for heartburn. Negative for abdominal pain, constipation and diarrhea.  Neurological:  Negative for dizziness, weakness, light-headedness and headaches.  Psychiatric/Behavioral:  Negative for suicidal ideas. The patient is not nervous/anxious.     Patient Active Problem List   Diagnosis Date Noted   BRCA gene mutation negative in female 03/06/2023   PCOS (polycystic ovarian syndrome) 03/06/2023   Gastroesophageal reflux disease 02/26/2022   Anxiety and depression 11/18/2017   Vitamin D deficiency 12/13/2016   Other seasonal allergic rhinitis 12/03/2015   Obesity 12/03/2015   Family hx of ovarian malignancy 12/03/2015    Allergies  Allergen Reactions   Almond (Diagnostic) Hives and Swelling   Celery Oil Swelling   Codeine Nausea Only   Peanut (Diagnostic) Other (See Comments)    Allergy testing   Peanut-Containing Drug Products Other (See Comments)    Allergy testing    Pollen Extract  Swelling   Tree Extract Hives and Swelling    Past Surgical History:  Procedure Laterality Date   CHOLECYSTECTOMY, LAPAROSCOPIC  02/24/2021   SKIN CANCER DESTRUCTION Left 2008   Breast   WISDOM TOOTH EXTRACTION      Social History   Tobacco Use   Smoking status: Former    Current packs/day: 0.00    Average packs/day: 0.5 packs/day for 4.0 years (2.0 ttl pk-yrs)    Types: Cigarettes    Start date: 07/10/1999    Quit date: 07/10/2003    Years since  quitting: 19.6   Smokeless tobacco: Never  Vaping Use   Vaping status: Never Used  Substance Use Topics   Alcohol use: Yes    Alcohol/week: 2.0 standard drinks of alcohol    Types: 1 Glasses of wine, 1 Cans of beer per week    Comment: socially   Drug use: No     Medication list has been reviewed and updated.  Current Meds  Medication Sig   caffeine 200 MG TABS tablet Take 0.5 tablets (100 mg total) by mouth daily as needed.   cetirizine (ZYRTEC) 10 MG tablet Take 10 mg by mouth as needed.    citalopram (CELEXA) 20 MG tablet Take 20 mg by mouth daily.   Ginkgo Biloba (GINKGO PO) Take by mouth.   metFORMIN (GLUCOPHAGE) 500 MG tablet Take 1 tablet (500 mg total) by mouth daily with breakfast.   Multiple Vitamin (MULTIVITAMIN WITH MINERALS) TABS tablet Take 1 tablet by mouth daily.   Norgestimate-Ethinyl Estradiol Triphasic (TRI-LO-MARZIA) 0.18/0.215/0.25 MG-25 MCG tab Take 1 tablet by mouth daily.   omeprazole (PRILOSEC OTC) 20 MG tablet Take 20 mg by mouth daily.   traZODone (DESYREL) 50 MG tablet Take 50 mg by mouth at bedtime.   Vitamin D, Ergocalciferol, (DRISDOL) 1.25 MG (50000 UNIT) CAPS capsule Take 1 capsule (50,000 Units total) by mouth every 7 (seven) days.       03/06/2023    2:53 PM 03/11/2018   11:16 AM 02/17/2018   11:13 AM 01/30/2018    1:04 PM  GAD 7 : Generalized Anxiety Score  Nervous, Anxious, on Edge 1 1 1 2   Control/stop worrying 0 0 0 1  Worry too much - different things 0 0 0 0  Trouble relaxing 0 1 1 2   Restless 0 0 0 0  Easily annoyed or irritable 0 0 0 1  Afraid - awful might happen 0 0 0 0  Total GAD 7 Score 1 2 2 6   Anxiety Difficulty Not difficult at all Not difficult at all Not difficult at all Somewhat difficult       03/06/2023    2:53 PM 05/16/2018    8:48 AM 03/11/2018   11:13 AM  Depression screen PHQ 2/9  Decreased Interest 1 0 1  Down, Depressed, Hopeless 0 0 0  PHQ - 2 Score 1 0 1  Altered sleeping 2 0 1  Tired, decreased energy 2 0  2  Change in appetite 1 0 2  Feeling bad or failure about yourself  0 0 0  Trouble concentrating 0 0 0  Moving slowly or fidgety/restless 0 0 0  Suicidal thoughts 0 0 0  PHQ-9 Score 6 0 6  Difficult doing work/chores Not difficult at all Not difficult at all     BP Readings from Last 3 Encounters:  03/06/23 120/68  02/27/23 105/71  02/13/23 109/69    Physical Exam Vitals and nursing note reviewed.  Constitutional:  General: She is not in acute distress.    Appearance: She is well-developed.  HENT:     Head: Normocephalic and atraumatic.  Neck:     Vascular: No carotid bruit.  Cardiovascular:     Rate and Rhythm: Normal rate and regular rhythm.  Pulmonary:     Effort: Pulmonary effort is normal. No respiratory distress.     Breath sounds: No wheezing or rhonchi.  Musculoskeletal:     Cervical back: Normal range of motion.     Right lower leg: No edema.     Left lower leg: No edema.  Lymphadenopathy:     Cervical: No cervical adenopathy.  Skin:    General: Skin is warm and dry.     Capillary Refill: Capillary refill takes less than 2 seconds.     Findings: No rash.  Neurological:     General: No focal deficit present.     Mental Status: She is alert and oriented to person, place, and time.  Psychiatric:        Mood and Affect: Mood normal.        Behavior: Behavior normal.     Wt Readings from Last 3 Encounters:  03/06/23 229 lb (103.9 kg)  02/27/23 226 lb (102.5 kg)  02/13/23 228 lb (103.4 kg)    BP 120/68   Pulse 81   Ht 5\' 3"  (1.6 m)   Wt 229 lb (103.9 kg)   SpO2 98%   BMI 40.57 kg/m   Assessment and Plan:  Problem List Items Addressed This Visit       Unprioritized   Vitamin D deficiency    Now on high dose weekly Recommend a daily dose once the course of weekly is complete      PCOS (polycystic ovarian syndrome)    Stable, mild prediabetes On metformin - working on diet and weight loss      Obesity    Followed by Vernona Rieger - recent labs  stable Continue efforts      Gastroesophageal reflux disease    GERD much improved since gall bladder surgery Continue PPI as needed      BRCA gene mutation negative in female   Anxiety and depression - Primary    Clinically stable on current regimen with good control of symptoms, No SI or HI. No change in management at this time.        Return in about 6 months (around 09/06/2023) for Depression, GERD.    Reubin [name], MD [facility] [place]

## 2023-03-06 NOTE — Assessment & Plan Note (Signed)
Stable, mild prediabetes On metformin - working on diet and weight loss

## 2023-03-06 NOTE — Assessment & Plan Note (Signed)
Followed by [name] - recent labs stable Continue efforts

## 2023-03-06 NOTE — Assessment & Plan Note (Signed)
GERD much improved since gall bladder surgery Continue PPI as needed

## 2023-03-06 NOTE — Assessment & Plan Note (Signed)
Now on high dose weekly Recommend a daily dose once the course of weekly is complete

## 2023-03-06 NOTE — Assessment & Plan Note (Signed)
Clinically stable on current regimen with good control of symptoms, No SI or HI. No change in management at this time.  

## 2023-03-13 ENCOUNTER — Ambulatory Visit (INDEPENDENT_AMBULATORY_CARE_PROVIDER_SITE_OTHER): Payer: BC Managed Care – PPO | Admitting: Family Medicine

## 2023-03-13 ENCOUNTER — Encounter (INDEPENDENT_AMBULATORY_CARE_PROVIDER_SITE_OTHER): Payer: Self-pay | Admitting: Family Medicine

## 2023-03-13 VITALS — BP 108/67 | HR 71 | Temp 99.1°F | Ht 63.0 in | Wt 223.0 lb

## 2023-03-13 DIAGNOSIS — E669 Obesity, unspecified: Secondary | ICD-10-CM

## 2023-03-13 DIAGNOSIS — Z6839 Body mass index (BMI) 39.0-39.9, adult: Secondary | ICD-10-CM | POA: Diagnosis not present

## 2023-03-13 DIAGNOSIS — F3289 Other specified depressive episodes: Secondary | ICD-10-CM | POA: Diagnosis not present

## 2023-03-13 DIAGNOSIS — Z6838 Body mass index (BMI) 38.0-38.9, adult: Secondary | ICD-10-CM | POA: Insufficient documentation

## 2023-03-13 DIAGNOSIS — E559 Vitamin D deficiency, unspecified: Secondary | ICD-10-CM

## 2023-03-13 NOTE — Progress Notes (Unsigned)
Chief Complaint:   OBESITY Leah Brown is here to discuss her progress with her obesity treatment plan along with follow-up of her obesity related diagnoses. Leah Brown is on the Category 2 Plan + 100 calories and states she is following her eating plan approximately 75% of the time. Leah Brown states she is doing 0 minutes 0 times per week.  Today's visit was #: 3 Starting weight: 228 lbs Starting date: 02/13/2023 Today's weight: 223 lbs Today's date: 03/13/2023 Total lbs lost to date: 5 Total lbs lost since last in-office visit: 3  Interim History: Patient continues to do well with her weight loss.  She is somewhat discouraged with her weight loss as she was hoping to lose weight faster.  Subjective:   1. Vitamin D deficiency Patient is doing well on vitamin D prescription with no side effects noted.  Her vitamin D level is not yet at goal.  2. Emotional Eating Behavior Patient is struggling with feeling deprived and irritable as she is working on decreasing simple carbohydrates.  Assessment/Plan:   1. Vitamin D deficiency Patient will continue prescription vitamin D 50,000 IU once weekly.  2. Emotional Eating Behavior Patient agreed to start Wellbutrin SR 150 mg every morning #30 with no refills.  Emotional eating behavior strategies were discussed with the patient today.  3. BMI 39.0-39.9,adult  4. Obesity, Beginning BMI 02/13/23 40.04 Leah Brown is currently in the action stage of change. As such, her goal is to continue with weight loss efforts. She has agreed to the Category 2 Plan + 100 calories.   Patient was advised that her weight loss is at goal and she is doing well on her eating plan.  We discussed how fast her weight loss can trigger her body to increase fat storage, hunger signals, and encourage weight gain.  Behavioral modification strategies: emotional eating strategies.  Leah Brown has agreed to follow-up with our clinic in 2 weeks. She was informed of the importance of frequent  follow-up visits to maximize her success with intensive lifestyle modifications for her multiple health conditions.   Objective:   Blood pressure 108/67, pulse 71, temperature 99.1 F (37.3 C), height 5\' 3"  (1.6 m), weight 223 lb (101.2 kg), SpO2 99%. Body mass index is 39.5 kg/m.  Lab Results  Component Value Date   CREATININE 0.79 02/13/2023   BUN 8 02/13/2023   NA 139 02/13/2023   K 4.5 02/13/2023   CL 102 02/13/2023   CO2 22 02/13/2023   Lab Results  Component Value Date   ALT 20 02/13/2023   AST 17 02/13/2023   ALKPHOS 59 02/13/2023   BILITOT 0.3 02/13/2023   Lab Results  Component Value Date   HGBA1C 5.7 (H) 02/13/2023   HGBA1C 5.2 03/05/2018   HGBA1C 5.2 10/31/2017   Lab Results  Component Value Date   INSULIN 19.7 02/13/2023   INSULIN 11.9 03/05/2018   INSULIN 20.2 10/31/2017   Lab Results  Component Value Date   TSH 1.630 02/13/2023   Lab Results  Component Value Date   CHOL 201 (H) 02/13/2023   HDL 55 02/13/2023   LDLCALC 127 (H) 02/13/2023   TRIG 109 02/13/2023   CHOLHDL 3.5 12/13/2016   Lab Results  Component Value Date   VD25OH 24.1 (L) 02/13/2023   VD25OH 39.0 03/05/2018   VD25OH 28.3 (L) 10/31/2017   Lab Results  Component Value Date   WBC 7.1 02/13/2023   HGB 14.0 02/13/2023   HCT 42.7 02/13/2023   MCV 93 02/13/2023  PLT 289 02/13/2023   No results found for: "IRON", "TIBC", "FERRITIN"  Attestation Statements:   Reviewed by clinician on day of visit: allergies, medications, problem list, medical history, surgical history, family history, social history, and previous encounter notes.  I have personally spent 40 minutes total time today in preparation, patient care, and documentation for this visit, including the following: review of clinical lab tests; review of medical tests/procedures/services.  I, Burt Knack, am acting as transcriptionist for Quillian Quince, MD.  I have reviewed the above documentation for accuracy and  completeness, and I agree with the above. -  Quillian Quince, MD

## 2023-03-14 MED ORDER — BUPROPION HCL ER (SR) 150 MG PO TB12: 150.0000 mg | ORAL_TABLET | Freq: Every morning | ORAL | 0 refills | Status: DC

## 2023-03-18 ENCOUNTER — Telehealth (INDEPENDENT_AMBULATORY_CARE_PROVIDER_SITE_OTHER): Payer: BC Managed Care – PPO | Admitting: Psychology

## 2023-03-18 DIAGNOSIS — F5089 Other specified eating disorder: Secondary | ICD-10-CM

## 2023-03-18 NOTE — Progress Notes (Signed)
Office: [phone]  /  Fax: [phone]    Date: March 18, 2023    Appointment Start Time: 12:00pm Duration: 34 minutes Provider: Lawerance Cruel, Psy.D. Type of Session: Intake for Individual Therapy  Location of Patient: Home (private location) Location of Provider: Provider's home (private office) Type of Contact: Telepsychological Visit via MyChart Video Visit  Informed Consent: Prior to proceeding with today's appointment, two pieces of identifying information were obtained. In addition, Leah Brown's physical location at the time of this appointment was obtained as well a phone number she could be reached at in the event of technical difficulties. Rochele and this provider participated in today's telepsychological service.   The provider's role was explained to Leah Brown. The provider reviewed and discussed issues of confidentiality, privacy, and limits therein (e.g., reporting obligations). In addition to verbal informed consent, written informed consent for psychological services was obtained prior to the initial appointment. Since the clinic is not a 24/7 crisis center, mental health emergency resources were shared and this  provider explained MyChart, e-mail, voicemail, and/or other messaging systems should be utilized only for non-emergency reasons. This provider also explained that information obtained during appointments will be placed in Leah Brown's medical record and relevant information will be shared with other providers at [facility] at any locations for coordination of care. Peyton agreed information may be shared with other [facility] providers as needed for coordination of care and by signing the service agreement document, she provided written consent for coordination of care. Prior to initiating telepsychological services, Jil completed an informed consent document, which included the development of a safety plan (i.e., an emergency contact  and emergency resources) in the event of an emergency/crisis. Kataleah verbally acknowledged understanding she is ultimately responsible for understanding her insurance benefits for telepsychological and in-person services. This provider also reviewed confidentiality, as it relates to telepsychological services. Khila  acknowledged understanding that appointments cannot be recorded without both party consent and she is aware she is responsible for securing confidentiality on her end of the session. Nickol verbally consented to proceed.  Chief Complaint/HPI: Leah Brown was referred by Dr. Thomasene Lot due to mood disorder-emotional eating. Per the note for the visit with Dr. Thomasene Lot on 02/13/2023, "Condition is Improving, but not optimized.. Denies any SI/HI. Mood is stable. She currently takes Celexa for her mood and is tolerating this well. She did previously have a counselor but does not actively see her at this time.  Cravings and hunger are not well controlled. She tends to over eat as she is fearful her blood sugar will drop and has the constant feeling to eat. Pt eats when stressed, as a reward, and to stay awake. She does feel some guilt when she overeats/makes poor food choices." This provider previously met with [name], and the last appointment was on 03/11/2018.  During today's appointment, Leah Brown stated she gained weight since the last appointment with this provider, adding she had her gallbladder removed two years ago. She expressed desire to lose weight to improve quality of life. She was verbally administered a questionnaire assessing various behaviors related to emotional eating behaviors. Leah Brown endorsed the following: overeat when you are celebrating, experience food cravings on a regular basis, eat certain foods when you are anxious, stressed, depressed, or your feelings are hurt, use food to help you cope with emotional situations, find food is comforting to you, overeat when you are worried  about something, not worry about what you eat when you are  in a good mood, eat to help you stay awake, and eat as a reward. She shared she craves "carbs (e.g., "grab and go" foods)." Leah Brown believes the onset of emotional eating behaviors was likely during her 26s, noting she quit smoking, left an active job, and was married to her first husband. She described the current frequency of emotional eating behaviors as "few times a week." In addition, Leah Brown endorsed engaging in binge eating behaviors. For instance, she shared an example of eating a whole pizza. She described the frequency of engaging in binge eating behaviors as "every other week." Leah Brown denied a history of significantly restricting food intake, purging and engagement in other compensatory strategies for weight loss. She denied treatment for eating-related concerns since the last appointment with his provider in September 2019. Currently, Leah Brown indicated she recently started Wellbutrin, noting a reduction in cravings. Furthermore, Leah Brown discussed challenges when eating out and when there are temptations.   Mental Status Examination:  Appearance: neat Behavior: appropriate to circumstances Mood: neutral Affect: mood congruent Speech: WNL Eye Contact: appropriate Psychomotor Activity: WNL Gait: unable to assess  Thought Process: linear, logical, and goal directed and denies suicidal, homicidal, and self-harm ideation, plan and intent  Thought Content/Perception: no hallucinations, delusions, bizarre thinking or behavior endorsed or observed Orientation: AAOx4 Memory/Concentration: intact Insight/Judgment: fair  Family & Psychosocial History: Leah Brown reported she is married and she does not have any children. She indicated she is currently employed as a Research officer, trade union at [employer] and works a hybrid schedule. Additionally, Leah Brown shared her highest level of education obtained is a bachelor's degree, noting she is currently  working on her master's degree. Currently, Leah Brown's social support system consists of her husband, best friend, mother, and step-father. Moreover, Shakura stated she resides with her husband.   Medical History:  Past Medical History:  Diagnosis Date   Alcohol abuse    Amenorrhea 2006   Ongoing since 2006   Anxiety    Depression    Food allergy    GERD (gastroesophageal reflux disease)    High cholesterol    Hypertension    Infertility, female    Insulin resistance    Joint pain    Multiple food allergies    Obesity    Ophthalmic migraine 2006   PCOS (polycystic ovarian syndrome)    Skin cancer    Snores 12/02/2015   Vitamin D deficiency    Past Surgical History:  Procedure Laterality Date   CHOLECYSTECTOMY, LAPAROSCOPIC  02/24/2021   SKIN CANCER DESTRUCTION Left 2008   Breast   WISDOM TOOTH EXTRACTION     Current Outpatient Medications on File Prior to Visit  Medication Sig Dispense Refill   buPROPion (WELLBUTRIN SR) 150 MG 12 hr tablet Take 1 tablet (150 mg total) by mouth every morning. 30 tablet 0   caffeine 200 MG TABS tablet Take 0.5 tablets (100 mg total) by mouth daily as needed.     cetirizine (ZYRTEC) 10 MG tablet Take 10 mg by mouth as needed.      citalopram (CELEXA) 20 MG tablet Take 20 mg by mouth daily.     Ginkgo Biloba (GINKGO PO) Take by mouth.     metFORMIN (GLUCOPHAGE) 500 MG tablet Take 1 tablet (500 mg total) by mouth daily with breakfast. 30 tablet 5   Multiple Vitamin (MULTIVITAMIN WITH MINERALS) TABS tablet Take 1 tablet by mouth daily.     Norgestimate-Ethinyl Estradiol Triphasic (TRI-LO-MARZIA) 0.18/0.215/0.25 MG-25 MCG tab Take 1 tablet by mouth  daily.     omeprazole (PRILOSEC OTC) 20 MG tablet Take 20 mg by mouth daily.     traZODone (DESYREL) 50 MG tablet Take 50 mg by mouth at bedtime.     Vitamin D, Ergocalciferol, (DRISDOL) 1.25 MG (50000 UNIT) CAPS capsule Take 1 capsule (50,000 Units total) by mouth every 7 (seven) days. 4 capsule 0   No  current facility-administered medications on file prior to visit.  Cherese stated she is medication compliant.   Mental Health History: Marieliz reported she attended therapeutic services during the pandemic, noting they last met three months ago. She shared she is prescribed Celexa and Desyrel by her PCP, noting the medications are helpful. Marlynn reported there is no history of hospitalizations for psychiatric concerns. Lexxus endorsed a family history of mental health concerns (maternal cousin), and believes her father may be an alcoholic (father). Furthermore, Leondra denied any trauma since the last appointment with this provider.   Pierre described her typical mood lately as "good," but "a little stressed" due to work. Diavion endorsed current alcohol use (1-2xs a month in the form of 1-2 standard drinks). She denied current tobacco use. She denied illicit/recreational substance use. Furthermore, Retha indicated she is not experiencing the following: hallucinations and delusions, paranoia, symptoms of mania , social withdrawal, crying spells, panic attacks, memory concerns, attention and concentration issues, and obsessions and compulsions. She also denied history of and current suicidal ideation, plan, and intent; history of and current homicidal ideation, plan, and intent; and history of and current engagement in self-harm.  Legal History: Shamonica reported there is no history of legal involvement.   Structured Assessments Results: The Patient Health Questionnaire-9 (PHQ-9) is a self-report measure that assesses symptoms and severity of depression over the course of the last two weeks. Samiha obtained a score of 3 suggesting minimal depression. Jessicca finds the endorsed symptoms to be somewhat difficult. [0= Not at all; 1= Several days; 2= More than half the days; 3= Nearly every day] Little interest or pleasure in doing things 0  Feeling down, depressed, or hopeless 0  Trouble falling or staying asleep, or  sleeping too much 0  Feeling tired or having little energy 2  Poor appetite or overeating 1  Feeling bad about yourself --- or that you are a failure or have let yourself or your family down 0  Trouble concentrating on things, such as reading the newspaper or watching television 0  Moving or speaking so slowly that other people could have noticed? Or the opposite --- being so fidgety or restless that you have been moving around a lot more than usual 0  Thoughts that you would be better off dead or hurting yourself in some way 0  PHQ-9 Score 3    The Generalized Anxiety Disorder-7 (GAD-7) is a brief self-report measure that assesses symptoms of anxiety over the course of the last two weeks. Casaundra obtained a score of 2 suggesting minimal anxiety. Vieva finds the endorsed symptoms to be not difficult at all. [0= Not at all; 1= Several days; 2= Over half the days; 3= Nearly every day] Feeling nervous, anxious, on edge 1  Not being able to stop or control worrying 0  Worrying too much about different things 0  Trouble relaxing 1  Being so restless that it's hard to sit still 0  Becoming easily annoyed or irritable 0  Feeling afraid as if something awful might happen 0  GAD-7 Score 2   Interventions:  Conducted a chart review  Focused on rapport building Verbally administered PHQ-9 and GAD-7 for symptom monitoring Verbally administered Food & Mood questionnaire to assess various behaviors related to emotional eating Provided emphatic reflections and validation Psychoeducation provided regarding physical versus emotional hunger  Diagnostic Impressions & Provisional DSM-5 Diagnosis(es): Ineze discussed a history of engagement in emotional eating behaviors starting in her 14s due to stressors/changes, and described the current frequency a "few times a week." She also discussed engaging in binge eating behaviors "every other week." She denied engagement in any other disordered eating behaviors.  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  Plan: Rubie appears able and willing to participate as evidenced by engagement in reciprocal conversation and asking questions as needed for clarification. The next appointment is scheduled for 04/15/2023 at 8am, which will be via MyChart Video Visit. The following treatment goal was established: increase coping skills. This provider will regularly review the treatment plan and medical chart to keep informed of status changes. Stormi expressed understanding and agreement with the initial treatment plan of care. Darien will be sent a handout via e-mail to utilize between now and the next appointment to increase awareness of hunger patterns and subsequent eating. Kearah provided verbal consent during today's appointment for this provider to send the handout via e-mail.

## 2023-03-27 ENCOUNTER — Ambulatory Visit (INDEPENDENT_AMBULATORY_CARE_PROVIDER_SITE_OTHER): Payer: BC Managed Care – PPO | Admitting: Family Medicine

## 2023-03-27 ENCOUNTER — Encounter (INDEPENDENT_AMBULATORY_CARE_PROVIDER_SITE_OTHER): Payer: Self-pay | Admitting: Family Medicine

## 2023-03-27 VITALS — BP 110/72 | HR 78 | Temp 98.7°F | Ht 63.0 in | Wt 217.0 lb

## 2023-03-27 DIAGNOSIS — F5089 Other specified eating disorder: Secondary | ICD-10-CM | POA: Diagnosis not present

## 2023-03-27 DIAGNOSIS — Z6839 Body mass index (BMI) 39.0-39.9, adult: Secondary | ICD-10-CM

## 2023-03-27 DIAGNOSIS — E669 Obesity, unspecified: Secondary | ICD-10-CM

## 2023-03-27 DIAGNOSIS — E559 Vitamin D deficiency, unspecified: Secondary | ICD-10-CM | POA: Diagnosis not present

## 2023-03-27 DIAGNOSIS — E88819 Insulin resistance, unspecified: Secondary | ICD-10-CM | POA: Diagnosis not present

## 2023-03-27 DIAGNOSIS — F39 Unspecified mood [affective] disorder: Secondary | ICD-10-CM

## 2023-03-27 MED ORDER — VITAMIN D (ERGOCALCIFEROL) 1.25 MG (50000 UNIT) PO CAPS: 50000.0000 [IU] | ORAL_CAPSULE | ORAL | 0 refills | Status: DC

## 2023-03-27 MED ORDER — BUPROPION HCL ER (SR) 150 MG PO TB12: 150.0000 mg | ORAL_TABLET | Freq: Every morning | ORAL | 0 refills | Status: DC

## 2023-03-27 NOTE — Progress Notes (Unsigned)
Leah Brown, D.O.  ABFM, ABOM Specializing in Clinical Bariatric Medicine  Office located at: [address] [place], [place]  [address]     Assessment and Plan:   Medications Discontinued During This Encounter  Medication Reason   Vitamin D, Ergocalciferol, (DRISDOL) 1.25 MG (50000 UNIT) CAPS capsule Reorder   buPROPion (WELLBUTRIN SR) 150 MG 12 hr tablet Reorder    Meds ordered this encounter  Medications   buPROPion (WELLBUTRIN SR) 150 MG 12 hr tablet    Sig: Take 1 tablet (150 mg total) by mouth every morning.    Dispense:  30 tablet    Refill:  0   Vitamin D, Ergocalciferol, (DRISDOL) 1.25 MG (50000 UNIT) CAPS capsule    Sig: Take 1 capsule (50,000 Units total) by mouth every 7 (seven) days.    Dispense:  4 capsule    Refill:  0    Insulin resistance Assessment: Condition is Not at goal.. She continues Metformin and was experiencing diarrhea with her newly prescribed Wellbutrin. This was resolved when she started taking her Wellbutrin in the morning and Metformin before bed. She denies any excess hunger and cravings.  Lab Results  Component Value Date   HGBA1C 5.7 (H) 02/13/2023   HGBA1C 5.2 03/05/2018   HGBA1C 5.2 10/31/2017   INSULIN 19.7 02/13/2023   INSULIN 11.9 03/05/2018   INSULIN 20.2 10/31/2017    Plan: - Continue Metformin 500mg  once daily. I will refill today.   - Continue to decrease simple carbs/ sugars; increase fiber and proteins -> follow her meal plan.    - Anticipatory guidance given.    - Leah Brown will continue to work on weight loss, exercise, via their meal plan we devised to help decrease the risk of progressing to diabetes.    Vitamin D deficiency Assessment: Condition is Not at goal.. Since starting on 02/27/2023 she endorses taking her vitamin D supplement consistently and denies any adverse side effects.  Lab Results  Component Value Date   VD25OH 24.1 (L) 02/13/2023   VD25OH 39.0 03/05/2018   VD25OH 28.3 (L) 10/31/2017    Plan: - Continue ERGO once weekly and she was encouraged to continue to take the medicine until told otherwise.   I will refill today.   - We will continue to monitor levels regularly every 3-4 mo on average to keep levels within normal limits and prevent over supplementation.   Mood disorder (HCC)- Emotional eating Assessment: Condition is Improving, but not optimized.. Denies any SI/HI. Mood is stable. Cravings and hunger are improved. She has been on Wellbutrin for 2 weeks started on 03/13/2023 and states that this has helped quiet the "food noise" immediately. She does endorse having diarrhea when taking this with Metformin. She continues Celexa without difficulty and takes her Wellbutrin and Celexa together.   Plan: - Continue Wellbutrin SR 150mg  as directed and Celexa.   - I advised if the Wellbutrin helps with the mood our goal is to the wean down on the Celexa as it is a obesogenic drug.   - I also educated patient that trazodone has obesogenic properties and suggested that she transition to Melatonin.   - Discussed how thoughts affect eating habits, modeling of thoughts, feelings, and behaviors, and strategies for change.   - We will continue to monitor closely alongside PCP / other specialists.   TREATMENT PLAN FOR OBESITY: BMI 39.0-39.9,adult Obesity, Beginning BMI 02/13/23 40.04 Assessment:  Leah Brown is here to discuss her progress with her obesity treatment  plan along with follow-up of her obesity related diagnoses. See Medical Weight Management Flowsheet for complete bioelectrical impedance results.  Condition is docourse: improving but not optimized. Biometric data collected today, was reviewed with patient.   Since last office visit on 03/13/2023 patient's  Muscle mass has decreased by 0.8lb. Fat mass has decreased by 5.4lb. Total body water has decreased by 2.6lb.  Counseling done on how various foods will affect these numbers and how to maximize  success  Total lbs lost to date: 11 Total weight loss percentage to date: 4.82%   Plan: - Leah Brown will work on healthier eating habits and follow the Category 2 meal plan with 100 snack calories best they can.   - I recommended laughing cow light wedge, non-fat greek yogurt, and vinegar as a replacement for mayo.   Behavioral Intervention Additional resources provided today: patient declined Evidence-based interventions for health behavior change were utilized today including the discussion of self monitoring techniques, problem-solving barriers and SMART goal setting techniques.   Regarding patient's less desirable eating habits and patterns, we employed the technique of small changes.  Pt will specifically work on: Continue her meal plan for next visit.     She has agreed to Think about ways to increase daily physical activity and overcoming barriers to exercise   FOLLOW UP: Return in about 2 weeks (around 04/10/2023).  She was informed of the importance of frequent follow up visits to maximize her success with intensive lifestyle modifications for her multiple health conditions.  Subjective:   Chief complaint: Obesity Leah Brown is here to discuss her progress with her obesity treatment plan. She is on the the Category 2 Plan with only 100 snack calories and states she is following her eating plan approximately 85% of the time. She states she is not exercising.  Interval History:  Leah Brown is here for a follow up office visit.     Since last office visit:  She has been well. She has tried to be more focused on her protein intake as her cravings have decreased since starting Wellbutrin. She will have salads with extra protein and has noticed that this now feels her up. Thinking about times where "will she eat broccoli" helps her realize and recognize if she is actually hungry or not. She eats 100 calorie popcorn as a snack.    Pharmacotherapy for weight loss: She is currently  taking  Metformin  for medical weight loss.  Denies side effects.    Review of Systems:  Pertinent positives were addressed with patient today.  Reviewed by clinician on day of visit: allergies, medications, problem list, medical history, surgical history, family history, social history, and previous encounter notes.  Weight Summary and Biometrics   Weight Lost Since Last Visit: 6lb  Weight Gained Since Last Visit: 0lb   Vitals Temp: 98.7 F (37.1 C) BP: 110/72 Pulse Rate: 78 SpO2: 97 %   Anthropometric Measurements Height: 5\' 3"  (1.6 m) Weight: 217 lb (98.4 kg) BMI (Calculated): 38.45 Weight at Last Visit: 223lb Weight Lost Since Last Visit: 6lb Weight Gained Since Last Visit: 0lb Starting Weight: 228lb Total Weight Loss (lbs): 11 lb (4.99 kg) Peak Weight: 235lb   Body Composition  Body Fat %: 44.3 % Fat Mass (lbs): 96.4 lbs Muscle Mass (lbs): 114.8 lbs Total Body Water (lbs): 84.2 lbs Visceral Fat Rating : 11   Other Clinical Data Fasting: no Labs: no Today's Visit #: 4 Starting Date: 02/13/23     Objective:  PHYSICAL EXAM: Blood pressure 110/72, pulse 78, temperature 98.7 F (37.1 C), height 5\' 3"  (1.6 m), weight 217 lb (98.4 kg), SpO2 97%. Body mass index is 38.44 kg/m.  General: Well Developed, well nourished, and in no acute distress.  HEENT: Normocephalic, atraumatic Skin: Warm and dry, cap RF less 2 sec, good turgor Chest:  Normal excursion, shape, no gross abn Respiratory: speaking in full sentences, no conversational dyspnea NeuroM-Sk: Ambulates w/o assistance, moves * 4 Psych: A and O *3, insight good, mood-full  DIAGNOSTIC DATA REVIEWED:  BMET    Component Value Date/Time   NA 139 02/13/2023 0859   NA 138 06/11/2013 1014   K 4.5 02/13/2023 0859   K 3.8 06/11/2013 1014   CL 102 02/13/2023 0859   CL 102 06/11/2013 1014   CO2 22 02/13/2023 0859   CO2 27 06/11/2013 1014   GLUCOSE 100 (H) 02/13/2023 0859   GLUCOSE 96 06/25/2017  1205   GLUCOSE 96 06/11/2013 1014   BUN 8 02/13/2023 0859   BUN 11 06/11/2013 1014   CREATININE 0.79 02/13/2023 0859   CREATININE 0.72 06/25/2017 1205   CALCIUM 9.3 02/13/2023 0859   CALCIUM 8.7 06/11/2013 1014   GFRNONAA 97 03/05/2018 0835   GFRNONAA 108 06/25/2017 1205   GFRAA 111 03/05/2018 0835   GFRAA 126 06/25/2017 1205   Lab Results  Component Value Date   HGBA1C 5.7 (H) 02/13/2023   HGBA1C 5.2 10/31/2017   Lab Results  Component Value Date   INSULIN 19.7 02/13/2023   INSULIN 20.2 10/31/2017   Lab Results  Component Value Date   TSH 1.630 02/13/2023   CBC    Component Value Date/Time   WBC 7.1 02/13/2023 0859   WBC 8.4 06/25/2017 1205   RBC 4.61 02/13/2023 0859   RBC 4.51 06/25/2017 1205   HGB 14.0 02/13/2023 0859   HCT 42.7 02/13/2023 0859   PLT 289 02/13/2023 0859   MCV 93 02/13/2023 0859   MCV 91 06/11/2013 1014   MCH 30.4 02/13/2023 0859   MCH 30.4 06/25/2017 1205   MCHC 32.8 02/13/2023 0859   MCHC 33.7 06/25/2017 1205   RDW 13.2 02/13/2023 0859   RDW 12.5 06/11/2013 1014   Iron Studies No results found for: "IRON", "TIBC", "FERRITIN", "IRONPCTSAT" Lipid Panel     Component Value Date/Time   CHOL 201 (H) 02/13/2023 0859   CHOL 175 06/11/2013 1014   TRIG 109 02/13/2023 0859   TRIG 52 06/11/2013 1014   HDL 55 02/13/2023 0859   HDL 42 06/11/2013 1014   CHOLHDL 3.5 12/13/2016 0902   VLDL 13 12/13/2016 0902   VLDL 10 06/11/2013 1014   LDLCALC 127 (H) 02/13/2023 0859   LDLCALC 123 (H) 06/11/2013 1014   Hepatic Function Panel     Component Value Date/Time   PROT 7.0 02/13/2023 0859   PROT 7.3 06/11/2013 1014   ALBUMIN 4.3 02/13/2023 0859   ALBUMIN 3.9 06/11/2013 1014   AST 17 02/13/2023 0859   AST 13 (L) 06/11/2013 1014   ALT 20 02/13/2023 0859   ALT 21 06/11/2013 1014   ALKPHOS 59 02/13/2023 0859   ALKPHOS 52 06/11/2013 1014   BILITOT 0.3 02/13/2023 0859   BILITOT 0.4 06/11/2013 1014      Component Value Date/Time   TSH 1.630  02/13/2023 0859   Nutritional Lab Results  Component Value Date   VD25OH 24.1 (L) 02/13/2023   VD25OH 39.0 03/05/2018   VD25OH 28.3 (L) 10/31/2017    Attestations:   [place]  Financial risk analyst, acting as a Stage manager for [employer], DO., have compiled all relevant documentation for today's office visit on behalf of Thomasene Lot, DO, while in the presence of [employer], DO.  I have reviewed the above documentation for accuracy and completeness, and I agree with the above. Leah Brown, D.O.  The 21st Century Cures Act was signed into law in 2016 which includes the topic of electronic health records.  This provides immediate access to information in MyChart.  This includes consultation notes, operative notes, office notes, lab results and pathology reports.  If you have any questions about what you read please let us know at your next visit so we can discuss your concerns and take corrective action if need be.  We are right here with you.

## 2023-04-08 ENCOUNTER — Other Ambulatory Visit (INDEPENDENT_AMBULATORY_CARE_PROVIDER_SITE_OTHER): Payer: Self-pay | Admitting: Family Medicine

## 2023-04-11 ENCOUNTER — Encounter (INDEPENDENT_AMBULATORY_CARE_PROVIDER_SITE_OTHER): Payer: Self-pay | Admitting: Family Medicine

## 2023-04-11 ENCOUNTER — Ambulatory Visit (INDEPENDENT_AMBULATORY_CARE_PROVIDER_SITE_OTHER): Payer: BC Managed Care – PPO | Admitting: Family Medicine

## 2023-04-11 VITALS — BP 108/71 | HR 72 | Temp 99.0°F | Ht 63.0 in | Wt 216.0 lb

## 2023-04-11 DIAGNOSIS — F39 Unspecified mood [affective] disorder: Secondary | ICD-10-CM | POA: Diagnosis not present

## 2023-04-11 DIAGNOSIS — E559 Vitamin D deficiency, unspecified: Secondary | ICD-10-CM | POA: Diagnosis not present

## 2023-04-11 DIAGNOSIS — Z6839 Body mass index (BMI) 39.0-39.9, adult: Secondary | ICD-10-CM

## 2023-04-11 DIAGNOSIS — R7303 Prediabetes: Secondary | ICD-10-CM | POA: Diagnosis not present

## 2023-04-11 DIAGNOSIS — E669 Obesity, unspecified: Secondary | ICD-10-CM | POA: Diagnosis not present

## 2023-04-11 DIAGNOSIS — Z6838 Body mass index (BMI) 38.0-38.9, adult: Secondary | ICD-10-CM

## 2023-04-11 MED ORDER — METFORMIN HCL 500 MG PO TABS: ORAL_TABLET | ORAL | Status: DC

## 2023-04-11 MED ORDER — VITAMIN D (ERGOCALCIFEROL) 1.25 MG (50000 UNIT) PO CAPS: 50000.0000 [IU] | ORAL_CAPSULE | ORAL | 0 refills | Status: DC

## 2023-04-11 MED ORDER — BUPROPION HCL ER (SR) 150 MG PO TB12
150.0000 mg | ORAL_TABLET | Freq: Every morning | ORAL | 0 refills | Status: DC
Start: 2023-04-11 — End: 2023-05-08

## 2023-04-11 NOTE — Progress Notes (Signed)
[name], D.O.  ABFM, ABOM Specializing in Clinical Bariatric Medicine  Office located at: [address] [place], [place]  [address]     Assessment and Plan:   Medications Discontinued During This Encounter  Medication Reason   Ginkgo Biloba (GINKGO PO)    metFORMIN (GLUCOPHAGE) 500 MG tablet Reorder   buPROPion (WELLBUTRIN SR) 150 MG 12 hr tablet Reorder   Vitamin D, Ergocalciferol, (DRISDOL) 1.25 MG (50000 UNIT) CAPS capsule Reorder     Meds ordered this encounter  Medications   Vitamin D, Ergocalciferol, (DRISDOL) 1.25 MG (50000 UNIT) CAPS capsule    Sig: Take 1 capsule (50,000 Units total) by mouth every 7 (seven) days.    Dispense:  4 capsule    Refill:  0   buPROPion (WELLBUTRIN SR) 150 MG 12 hr tablet    Sig: Take 1 tablet (150 mg total) by mouth every morning.    Dispense:  30 tablet    Refill:  0   metFORMIN (GLUCOPHAGE) 500 MG tablet    Sig: 1 po lunch daily     Prediabetes Assessment & Plan: 1 mo ago, fasting insulin and Hemoglobin A1c was 19.7 & 5.7 respectively. Pt is taking Metformin 500 mg with dinner daily. Reports having some hunger & cravings in the afternoon and evenings. Pt has been instructed to take her Metformin at lunch instead of at dinner. We will see if this helps in abating her hunger & cravings. If not, we may consider doubling her Metformin in the future. Continue with weight loss therapy.    Mood disorder (HCC)- Emotional eating Assessment & Plan: Since last OV, pt does endorse experiencing some stress secondary to work and personal life. Was also doing some stress eating. Overall, pt reports that mood is better. Pt is compliant and tolerant with Wellbutrin SR & Celexa. Will refill Wellbutrin today - no dose change. We will begin weaning pt of the Celexa in the beginning of the new year.     Vitamin D deficiency Assessment & Plan: Last vitamin D levels were 24.1 on 02/13/23. Endorses taking ERGO 50,000 units once every 7 days.  Continue with their weight loss efforts and Rx high-dose vitamin D - Will refill ERGO today. Vitamin D; future.    BMI 39.0-39.9,adult- current BMI 38.27 Obesity, Beginning BMI 02/13/23 40.04 Assessment & Plan: Since last office visit on 03/27/23 patient's  Muscle mass has not changed. Fat mass has decreased by 0.6 lb. Total body water has increased by 0.6 lb.  Counseling done on how various foods will affect these numbers and how to maximize success  Total lbs lost to date: 12 lbs  Total weight loss percentage to date: 5.26  Strategized ways that pt can make healthier choices at future work retreats: choosing fruits, eggs, lean protein, removing bun from burger, removing breading from fried chicken, etc...   Informed pt about the 10:1 ratio of calories to grams of protein as a general rule of thumb.   No change to meal plan - see Subjective   Behavioral Intervention Additional resources provided today: n/a  Evidence-based interventions for health behavior change were utilized today including the discussion of self monitoring techniques, problem-solving barriers and SMART goal setting techniques.   Regarding patient's less desirable eating habits and patterns, we employed the technique of small changes.  Pt will specifically work on: increasing lean protein intake for next visit.    FOLLOW UP: Return 04/25/23. She was informed of the importance of frequent follow  up visits to maximize her success with intensive lifestyle modifications for her multiple health conditions.  Subjective:   Chief complaint: Obesity Khadeejah is here to discuss her progress with her obesity treatment plan. She is on the Category 2 meal plan with 100 snack calories and states she is following her eating plan approximately 5% of the time. She states she is not exercising.   Interval History:  Yeslin Hurst Quattrone is here for a follow up office visit. Since last OV, pt does endorse experiencing some stress secondary  to work and personal life. Was also doing some stress eating. Endorses having more sweets, fatty cabs (e.g pizza), & eating off plan foods at a 3-day work retreat. Reports that "things have settled down now" and is eager to back get on track.   Pharmacotherapy for weight loss: She is currently taking  Wellbutrin SR 150 mg daily & Metformin 500 mg daily   for medical weight loss.  Denies side effects.    Review of Systems:  Pertinent positives were addressed with patient today.  Reviewed by clinician on day of visit: allergies, medications, problem list, medical history, surgical history, family history, social history, and previous encounter notes.  Weight Summary and Biometrics   Weight Lost Since Last Visit: 1lb  Weight Gained Since Last Visit: 0lb   Vitals Temp: 99 F (37.2 C) BP: 108/71 Pulse Rate: 72 SpO2: 99 %   Anthropometric Measurements Height: 5\' 3"  (1.6 m) Weight: 216 lb (98 kg) BMI (Calculated): 38.27 Weight at Last Visit: 217lb Weight Lost Since Last Visit: 1lb Weight Gained Since Last Visit: 0lb Starting Weight: 228lb Peak Weight: 235lb   Body Composition  Body Fat %: 44.2 % Fat Mass (lbs): 95.8 lbs Muscle Mass (lbs): 114.8 lbs Total Body Water (lbs): 84.8 lbs Visceral Fat Rating : 11   Other Clinical Data Fasting: no Labs: no Today's Visit #: 5 Starting Date: 02/13/23   Objective:   PHYSICAL EXAM: Blood pressure 108/71, pulse 72, temperature 99 F (37.2 C), height 5\' 3"  (1.6 m), weight 216 lb (98 kg), SpO2 99%. Body mass index is 38.26 kg/m.  General: Well Developed, well nourished, and in no acute distress.  HEENT: Normocephalic, atraumatic Skin: Warm and dry, cap RF less 2 sec, good turgor Chest:  Normal excursion, shape, no gross abn Respiratory: speaking in full sentences, no conversational dyspnea NeuroM-Sk: Ambulates w/o assistance, moves * 4 Psych: A and O *3, insight good, mood-full  DIAGNOSTIC DATA REVIEWED:  BMET     Component Value Date/Time   NA 139 02/13/2023 0859   NA 138 06/11/2013 1014   K 4.5 02/13/2023 0859   K 3.8 06/11/2013 1014   CL 102 02/13/2023 0859   CL 102 06/11/2013 1014   CO2 22 02/13/2023 0859   CO2 27 06/11/2013 1014   GLUCOSE 100 (H) 02/13/2023 0859   GLUCOSE 96 06/25/2017 1205   GLUCOSE 96 06/11/2013 1014   BUN 8 02/13/2023 0859   BUN 11 06/11/2013 1014   CREATININE 0.79 02/13/2023 0859   CREATININE 0.72 06/25/2017 1205   CALCIUM 9.3 02/13/2023 0859   CALCIUM 8.7 06/11/2013 1014   GFRNONAA 97 03/05/2018 0835   GFRNONAA 108 06/25/2017 1205   GFRAA 111 03/05/2018 0835   GFRAA 126 06/25/2017 1205   Lab Results  Component Value Date   HGBA1C 5.7 (H) 02/13/2023   HGBA1C 5.2 10/31/2017   Lab Results  Component Value Date   INSULIN 19.7 02/13/2023   INSULIN 20.2 10/31/2017   Lab  Results  Component Value Date   TSH 1.630 02/13/2023   CBC    Component Value Date/Time   WBC 7.1 02/13/2023 0859   WBC 8.4 06/25/2017 1205   RBC 4.61 02/13/2023 0859   RBC 4.51 06/25/2017 1205   HGB 14.0 02/13/2023 0859   HCT 42.7 02/13/2023 0859   PLT 289 02/13/2023 0859   MCV 93 02/13/2023 0859   MCV 91 06/11/2013 1014   MCH 30.4 02/13/2023 0859   MCH 30.4 06/25/2017 1205   MCHC 32.8 02/13/2023 0859   MCHC 33.7 06/25/2017 1205   RDW 13.2 02/13/2023 0859   RDW 12.5 06/11/2013 1014   Iron Studies No results found for: "IRON", "TIBC", "FERRITIN", "IRONPCTSAT" Lipid Panel     Component Value Date/Time   CHOL 201 (H) 02/13/2023 0859   CHOL 175 06/11/2013 1014   TRIG 109 02/13/2023 0859   TRIG 52 06/11/2013 1014   HDL 55 02/13/2023 0859   HDL 42 06/11/2013 1014   CHOLHDL 3.5 12/13/2016 0902   VLDL 13 12/13/2016 0902   VLDL 10 06/11/2013 1014   LDLCALC 127 (H) 02/13/2023 0859   LDLCALC 123 (H) 06/11/2013 1014   Hepatic Function Panel     Component Value Date/Time   PROT 7.0 02/13/2023 0859   PROT 7.3 06/11/2013 1014   ALBUMIN 4.3 02/13/2023 0859   ALBUMIN 3.9  06/11/2013 1014   AST 17 02/13/2023 0859   AST 13 (L) 06/11/2013 1014   ALT 20 02/13/2023 0859   ALT 21 06/11/2013 1014   ALKPHOS 59 02/13/2023 0859   ALKPHOS 52 06/11/2013 1014   BILITOT 0.3 02/13/2023 0859   BILITOT 0.4 06/11/2013 1014      Component Value Date/Time   TSH 1.630 02/13/2023 0859   Nutritional Lab Results  Component Value Date   VD25OH 24.1 (L) 02/13/2023   VD25OH 39.0 03/05/2018   VD25OH 28.3 (L) 10/31/2017    Attestations:   I, Special Puri, acting as a Stage manager for Thomasene Lot, DO., have compiled all relevant documentation for today's office visit on behalf of Thomasene Lot, DO, while in the presence of [employer], DO.  I have reviewed the above documentation for accuracy and completeness, and I agree with the above. [name], D.O.  The 21st Century Cures Act was signed into law in 2016 which includes the topic of electronic health records.  This provides immediate access to information in MyChart.  This includes consultation notes, operative notes, office notes, lab results and pathology reports.  If you have any questions about what you read please let us know at your next visit so we can discuss your concerns and take corrective action if need be.  We are right here with you.

## 2023-04-15 ENCOUNTER — Telehealth (INDEPENDENT_AMBULATORY_CARE_PROVIDER_SITE_OTHER): Payer: BC Managed Care – PPO | Admitting: Psychology

## 2023-04-15 DIAGNOSIS — F5089 Other specified eating disorder: Secondary | ICD-10-CM | POA: Diagnosis not present

## 2023-04-15 NOTE — Progress Notes (Addendum)
  Office: [phone]  /  Fax: [phone]    Date: April 15, 2023  Appointment Start Time: 8:01am Duration: 22 minutes Provider: Lawerance Cruel, Psy.D. Type of Session: Individual Therapy  Location of Patient: Home (private location) Location of Provider: Provider's Home (private office) Type of Contact: Telepsychological Visit via MyChart Video Visit  Session Content: Leah Brown is a 42 y.o. female presenting for a follow-up appointment to address the previously established treatment goal of increasing coping skills.Today's appointment was a telepsychological visit. Deepa provided verbal consent for today's telepsychological appointment and she is aware she is responsible for securing confidentiality on her end of the session. Prior to proceeding with today's appointment, [name]'s physical location at the time of this appointment was obtained as well a phone number she could be reached at in the event of technical difficulties. [name] and this provider participated in today's telepsychological service.   This provider conducted a brief check-in. Chong discussed deviations from her eating goals "when life happens." Further explored and processed. She identified rewarding herself and stress can trigger engagement in emotional eating behaviors. This provider and [name] explored where the aforementioned may be stemming from. Prisma was engaged in problem solving to determine other ways to reward herself and manage stress (e.g., go for walks, manicures, watch a movie). She was observed writing the aforementioned for future reference. Overall, [name] was receptive to today's appointment as evidenced by openness to sharing, responsiveness to feedback, and willingness to implement discussed strategies .  Mental Status Examination:  Appearance: neat Behavior: appropriate to circumstances Mood: neutral Affect: mood congruent Speech: WNL Eye Contact: appropriate Psychomotor Activity: WNL Gait: unable to  assess Thought Process: linear, logical, and goal directed and no evidence or endorsement of suicidal, homicidal, and self-harm ideation, plan and intent  Thought Content/Perception: no hallucinations, delusions, bizarre thinking or behavior endorsed or observed Orientation: AAOx4 Memory/Concentration: intact Insight: fair Judgment: fair  Interventions:  Conducted a brief chart review Provided empathic reflections and validation Provided positive reinforcement Employed supportive psychotherapy interventions to facilitate reduced distress and to improve coping skills with identified stressors Engaged patient in problem solving  DSM-5 Diagnosis(es): F50.89 Other Specified Feeding or Eating Disorder, Emotional and Binge Eating Behaviors  Treatment Goal & Progress: When re-initiating services with this provider, the following treatment goal was established: increase coping skills. [name] has demonstrated progress in her goal as evidenced by increased awareness of hunger patterns and triggers for emotional eating behaviors.   Plan: The next appointment is scheduled for 04/30/2023 at 8am, which will be via MyChart Video Visit. The next session will focus on working towards the established treatment goal.

## 2023-04-25 ENCOUNTER — Ambulatory Visit (INDEPENDENT_AMBULATORY_CARE_PROVIDER_SITE_OTHER): Payer: BC Managed Care – PPO | Admitting: Family Medicine

## 2023-04-25 ENCOUNTER — Encounter (INDEPENDENT_AMBULATORY_CARE_PROVIDER_SITE_OTHER): Payer: Self-pay | Admitting: Family Medicine

## 2023-04-25 VITALS — BP 110/66 | HR 79 | Temp 98.5°F | Ht 63.0 in | Wt 213.0 lb

## 2023-04-25 DIAGNOSIS — Z6837 Body mass index (BMI) 37.0-37.9, adult: Secondary | ICD-10-CM

## 2023-04-25 DIAGNOSIS — E559 Vitamin D deficiency, unspecified: Secondary | ICD-10-CM | POA: Diagnosis not present

## 2023-04-25 DIAGNOSIS — R7303 Prediabetes: Secondary | ICD-10-CM

## 2023-04-25 DIAGNOSIS — E669 Obesity, unspecified: Secondary | ICD-10-CM | POA: Diagnosis not present

## 2023-04-25 DIAGNOSIS — Z6839 Body mass index (BMI) 39.0-39.9, adult: Secondary | ICD-10-CM

## 2023-04-25 MED ORDER — METFORMIN HCL 500 MG PO TABS: ORAL_TABLET | ORAL | Status: DC

## 2023-04-25 MED ORDER — VITAMIN D (ERGOCALCIFEROL) 1.25 MG (50000 UNIT) PO CAPS: 50000.0000 [IU] | ORAL_CAPSULE | ORAL | 0 refills | Status: DC

## 2023-04-25 NOTE — Progress Notes (Signed)
Leah Brown, D.O.  ABFM, ABOM Specializing in Clinical Bariatric Medicine  Office located at: [address] [place], [place]  [address]   Assessment and Plan:   Medications Discontinued During This Encounter  Medication Reason   Vitamin D, Ergocalciferol, (DRISDOL) 1.25 MG (50000 UNIT) CAPS capsule Reorder   metFORMIN (GLUCOPHAGE) 500 MG tablet      Meds ordered this encounter  Medications   Vitamin D, Ergocalciferol, (DRISDOL) 1.25 MG (50000 UNIT) CAPS capsule    Sig: Take 1 capsule (50,000 Units total) by mouth every 7 (seven) days.    Dispense:  4 capsule    Refill:  0   metFORMIN (GLUCOPHAGE) 500 MG tablet    Sig: 2 po lunch daily     Prediabetes Assessment & Plan: Fasting insulin and Hemoglobin A1c was 19.7 & 5.7 respectively, roughly 1 mo ago. Pt currently on Metformin 500 mg with lunch daily without any adverse SE. Pt reports that her cravings have decreased in the afternoon, however she still has them after dinner. Pt instructed to increase Metformin to 500 mg bid; she can also take an additional tablet at dinner if needed. Continue with prudent nutritional plan.    Vitamin D deficiency Assessment & Plan: 02/13/23: vitamin D of 24.1. Pt endorses taking ERGO 50,000 units once every 7 days. Will refill ERGO today; no dose change. Will recheck levels in 1-2 mos.    BMI 39.0-39.9,adult- current BMI 37.74 Obesity, Beginning BMI 02/13/23 40.04 Assessment & Plan: Since last office visit on 04/11/23 patient's muscle mass has decreased by 0.6 lb. Fat mass has decreased by 2.4 lb. Total body water has decreased by 1.8 lb.  Counseling done on how various foods will affect these numbers and how to maximize success  Total lbs lost to date: 15 lbs  Total weight loss percentage to date: 6.58%  No change to meal plan - see Subjective   Recommended the following quick healthy snacks: [place] Jerkey, [place], [place] packet, Edamame beans, Protein One - [employer],  etc...  Behavioral Intervention Additional resources provided today: n/a Evidence-based interventions for health behavior change were utilized today including the discussion of self monitoring techniques, problem-solving barriers and SMART goal setting techniques.   Regarding patient's less desirable eating habits and patterns, we employed the technique of small changes.  Pt will specifically work on: walking 15-20 minutes, 2-3 days a week.   FOLLOW UP: Return 05/08/23. She was informed of the importance of frequent follow up visits to maximize her success with intensive lifestyle modifications for her multiple health conditions.  Subjective:   Chief complaint: Obesity Leah Brown is here to discuss her progress with her obesity treatment plan. She is on the Category 2 meal plan with 100 snack calories and states she is following her eating plan approximately 30% of the time. She states she is not exercising.   Interval History:  Leah Brown is here for a follow up office visit. Since last OV, [name] has been doing well. Endorses making healthier choices and being more protein focused. Pt meeting with Dr.Barker and feels that these visits are really helping her learn important techniques regarding her emotional eating behavior.   Pharmacotherapy for weight loss: She is currently taking  Wellbutrin SR 150 mg daily and Metformin 500 mg daily  for medical weight loss.  Denies side effects.    Review of Systems:  Pertinent positives were addressed with patient today.  Reviewed by clinician on day of visit: allergies, medications, problem  list, medical history, surgical history, family history, social history, and previous encounter notes.  Weight Summary and Biometrics   Weight Lost Since Last Visit: 3lb  Weight Gained Since Last Visit: 0lb   Vitals Temp: 98.5 F (36.9 C) BP: 110/66 Pulse Rate: 79 SpO2: 98 %   Anthropometric Measurements Height: 5\' 3"  (1.6 m) Weight: 213 lb  (96.6 kg) BMI (Calculated): 37.74 Weight at Last Visit: 216lb Weight Lost Since Last Visit: 3lb Weight Gained Since Last Visit: 0lb Starting Weight: 228lb Total Weight Loss (lbs): 14 lb (6.35 kg) Peak Weight: 235lb   Body Composition  Body Fat %: 43.7 % Fat Mass (lbs): 93.4 lbs Muscle Mass (lbs): 114.2 lbs Total Body Water (lbs): 83 lbs Visceral Fat Rating : 11   Other Clinical Data Fasting: no Labs: no Today's Visit #: 6 Starting Date: 02/13/23   Objective:   PHYSICAL EXAM: Blood pressure 110/66, pulse 79, temperature 98.5 F (36.9 C), height 5\' 3"  (1.6 m), weight 213 lb (96.6 kg), SpO2 98%. Body mass index is 37.73 kg/m.  General: Well Developed, well nourished, and in no acute distress.  HEENT: Normocephalic, atraumatic Skin: Warm and dry, cap RF less 2 sec, good turgor Chest:  Normal excursion, shape, no gross abn Respiratory: speaking in full sentences, no conversational dyspnea NeuroM-Sk: Ambulates w/o assistance, moves * 4 Psych: A and O *3, insight good, mood-full  DIAGNOSTIC DATA REVIEWED:  BMET    Component Value Date/Time   NA 139 02/13/2023 0859   NA 138 06/11/2013 1014   K 4.5 02/13/2023 0859   K 3.8 06/11/2013 1014   CL 102 02/13/2023 0859   CL 102 06/11/2013 1014   CO2 22 02/13/2023 0859   CO2 27 06/11/2013 1014   GLUCOSE 100 (H) 02/13/2023 0859   GLUCOSE 96 06/25/2017 1205   GLUCOSE 96 06/11/2013 1014   BUN 8 02/13/2023 0859   BUN 11 06/11/2013 1014   CREATININE 0.79 02/13/2023 0859   CREATININE 0.72 06/25/2017 1205   CALCIUM 9.3 02/13/2023 0859   CALCIUM 8.7 06/11/2013 1014   GFRNONAA 97 03/05/2018 0835   GFRNONAA 108 06/25/2017 1205   GFRAA 111 03/05/2018 0835   GFRAA 126 06/25/2017 1205   Lab Results  Component Value Date   HGBA1C 5.7 (H) 02/13/2023   HGBA1C 5.2 10/31/2017   Lab Results  Component Value Date   INSULIN 19.7 02/13/2023   INSULIN 20.2 10/31/2017   Lab Results  Component Value Date   TSH 1.630 02/13/2023    CBC    Component Value Date/Time   WBC 7.1 02/13/2023 0859   WBC 8.4 06/25/2017 1205   RBC 4.61 02/13/2023 0859   RBC 4.51 06/25/2017 1205   HGB 14.0 02/13/2023 0859   HCT 42.7 02/13/2023 0859   PLT 289 02/13/2023 0859   MCV 93 02/13/2023 0859   MCV 91 06/11/2013 1014   MCH 30.4 02/13/2023 0859   MCH 30.4 06/25/2017 1205   MCHC 32.8 02/13/2023 0859   MCHC 33.7 06/25/2017 1205   RDW 13.2 02/13/2023 0859   RDW 12.5 06/11/2013 1014   Iron Studies No results found for: "IRON", "TIBC", "FERRITIN", "IRONPCTSAT" Lipid Panel     Component Value Date/Time   CHOL 201 (H) 02/13/2023 0859   CHOL 175 06/11/2013 1014   TRIG 109 02/13/2023 0859   TRIG 52 06/11/2013 1014   HDL 55 02/13/2023 0859   HDL 42 06/11/2013 1014   CHOLHDL 3.5 12/13/2016 0902   VLDL 13 12/13/2016 0902   VLDL 10  06/11/2013 1014   LDLCALC 127 (H) 02/13/2023 0859   LDLCALC 123 (H) 06/11/2013 1014   Hepatic Function Panel     Component Value Date/Time   PROT 7.0 02/13/2023 0859   PROT 7.3 06/11/2013 1014   ALBUMIN 4.3 02/13/2023 0859   ALBUMIN 3.9 06/11/2013 1014   AST 17 02/13/2023 0859   AST 13 (L) 06/11/2013 1014   ALT 20 02/13/2023 0859   ALT 21 06/11/2013 1014   ALKPHOS 59 02/13/2023 0859   ALKPHOS 52 06/11/2013 1014   BILITOT 0.3 02/13/2023 0859   BILITOT 0.4 06/11/2013 1014      Component Value Date/Time   TSH 1.630 02/13/2023 0859   Nutritional Lab Results  Component Value Date   VD25OH 24.1 (L) 02/13/2023   VD25OH 39.0 03/05/2018   VD25OH 28.3 (L) 10/31/2017    Attestations:   I, Special Puri, acting as a Stage manager for Thomasene Lot, DO., have compiled all relevant documentation for today's office visit on behalf of Thomasene Lot, DO, while in the presence of [employer], DO.  I have reviewed the above documentation for accuracy and completeness, and I agree with the above. Leah Brown, D.O.  The 21st Century Cures Act was signed into law in 2016 which includes  the topic of electronic health records.  This provides immediate access to information in MyChart.  This includes consultation notes, operative notes, office notes, lab results and pathology reports.  If you have any questions about what you read please let us know at your next visit so we can discuss your concerns and take corrective action if need be.  We are right here with you.

## 2023-04-30 ENCOUNTER — Telehealth (INDEPENDENT_AMBULATORY_CARE_PROVIDER_SITE_OTHER): Payer: BC Managed Care – PPO | Admitting: Psychology

## 2023-04-30 DIAGNOSIS — F5089 Other specified eating disorder: Secondary | ICD-10-CM

## 2023-04-30 NOTE — Progress Notes (Addendum)
  Office: [phone]  /  Fax: [phone]    Date: April 30, 2023  Appointment Start Time: 7:59am Duration: 31 minutes Provider: Lawerance Cruel, Psy.D. Type of Session: Individual Therapy  Location of Patient: Home (private location) Location of Provider: Provider's Home (private office) Type of Contact: Telepsychological Visit via MyChart Video Visit  Session Content: Leah Brown is a 41 y.o. female presenting for a follow-up appointment to address the previously established treatment goal of increasing coping skills.Today's appointment was a telepsychological visit. Leah Brown provided verbal consent for today's telepsychological appointment and she is aware she is responsible for securing confidentiality on her end of the session. Prior to proceeding with today's appointment, Leah Brown's physical location at the time of this appointment was obtained as well a phone number she could be reached at in the event of technical difficulties. Leah Brown and this provider participated in today's telepsychological service.   This provider conducted a brief check-in. [name] reflected on the last appointment with this provider, adding she often makes choices regarding what to eat based on "what tastes good?" She shared about a recent instance of emotional eating behavior, and the example was processed. She acknowledged experiencing "guilt." Psychoeducation provided regarding self-compassion. Leah Brown was engaged in a self-compassion exercise to help with eating-related challenges and other ongoing stressors. She was encouraged to regularly ask herself, "What do I need right now?" and "How can I comfort and care for myself in this moment?" Overall, [name] was receptive to today's appointment as evidenced by openness to sharing, responsiveness to feedback, and willingness to work toward increasing self-compassion.  Mental Status Examination:  Appearance: neat Behavior: appropriate to circumstances Mood: neutral Affect: mood  congruent Speech: WNL Eye Contact: appropriate Psychomotor Activity: WNL Gait: unable to assess Thought Process: linear, logical, and goal directed and no evidence or endorsement of suicidal, homicidal, and self-harm ideation, plan and intent  Thought Content/Perception: no hallucinations, delusions, bizarre thinking or behavior endorsed or observed Orientation: AAOx4 Memory/Concentration: intact Insight: fair Judgment: fair  Interventions:  Conducted a brief chart review Provided empathic reflections and validation Reviewed content from the previous session Provided positive reinforcement Employed supportive psychotherapy interventions to facilitate reduced distress and to improve coping skills with identified stressors Psychoeducation provided regarding self-compassion Engaged pt in a self-compassion exercise  DSM-5 Diagnosis(es): F50.89 Other Specified Feeding or Eating Disorder, Emotional and Binge Eating Behaviors  Treatment Goal & Progress: When re-initiating services with this provider, the following treatment goal was established: increase coping skills. Leah Brown has demonstrated progress in her goal as evidenced by increased awareness hunger patterns and triggers for emotional eating behaviors.   Plan: The next appointment is scheduled for 05/21/2023 at 8am, which will be via MyChart Video Visit. The next session will focus on working towards the established treatment goal. Leah Brown will continue with her primary therapist.

## 2023-05-08 ENCOUNTER — Encounter (INDEPENDENT_AMBULATORY_CARE_PROVIDER_SITE_OTHER): Payer: Self-pay | Admitting: Family Medicine

## 2023-05-08 ENCOUNTER — Ambulatory Visit (INDEPENDENT_AMBULATORY_CARE_PROVIDER_SITE_OTHER): Payer: BC Managed Care – PPO | Admitting: Family Medicine

## 2023-05-08 DIAGNOSIS — F5089 Other specified eating disorder: Secondary | ICD-10-CM | POA: Diagnosis not present

## 2023-05-08 DIAGNOSIS — E559 Vitamin D deficiency, unspecified: Secondary | ICD-10-CM | POA: Diagnosis not present

## 2023-05-08 DIAGNOSIS — R7303 Prediabetes: Secondary | ICD-10-CM | POA: Diagnosis not present

## 2023-05-08 DIAGNOSIS — Z6837 Body mass index (BMI) 37.0-37.9, adult: Secondary | ICD-10-CM

## 2023-05-08 DIAGNOSIS — F39 Unspecified mood [affective] disorder: Secondary | ICD-10-CM

## 2023-05-08 DIAGNOSIS — Z6839 Body mass index (BMI) 39.0-39.9, adult: Secondary | ICD-10-CM

## 2023-05-08 DIAGNOSIS — E669 Obesity, unspecified: Secondary | ICD-10-CM | POA: Diagnosis not present

## 2023-05-08 MED ORDER — METFORMIN HCL 500 MG PO TABS
ORAL_TABLET | ORAL | 0 refills | Status: DC
Start: 2023-05-08 — End: 2023-06-05

## 2023-05-08 MED ORDER — BUPROPION HCL ER (SR) 150 MG PO TB12
150.0000 mg | ORAL_TABLET | Freq: Every morning | ORAL | 0 refills | Status: DC
Start: 2023-05-08 — End: 2023-06-05

## 2023-05-08 MED ORDER — VITAMIN D (ERGOCALCIFEROL) 1.25 MG (50000 UNIT) PO CAPS: 50000.0000 [IU] | ORAL_CAPSULE | ORAL | 0 refills | Status: DC

## 2023-05-08 NOTE — Progress Notes (Signed)
[name], D.O.  ABFM, ABOM Specializing in Clinical Bariatric Medicine  Office located at: [address] [place], [place]  [address]     Assessment and Plan:   No orders of the defined types were placed in this encounter.  Medications Discontinued During This Encounter  Medication Reason   buPROPion (WELLBUTRIN SR) 150 MG 12 hr tablet Reorder   Vitamin D, Ergocalciferol, (DRISDOL) 1.25 MG (50000 UNIT) CAPS capsule Reorder   metFORMIN (GLUCOPHAGE) 500 MG tablet Reorder    Meds ordered this encounter  Medications   buPROPion (WELLBUTRIN SR) 150 MG 12 hr tablet    Sig: Take 1 tablet (150 mg total) by mouth every morning.    Dispense:  30 tablet    Refill:  0   metFORMIN (GLUCOPHAGE) 500 MG tablet    Sig: 1 po lunch and dinner daily    Dispense:  60 tablet    Refill:  0   Vitamin D, Ergocalciferol, (DRISDOL) 1.25 MG (50000 UNIT) CAPS capsule    Sig: Take 1 capsule (50,000 Units total) by mouth every 7 (seven) days.    Dispense:  4 capsule    Refill:  0    Mood disorder (HCC)- Emotional eating Assessment & Plan: Mood is stable. No SI/HI. Hunger and cravings overall well controlled per patient. Pt taking Wellbutrin Sr 150 mg. Tolerating well with no side effects reported. Reminded patient of the importance of following their prudent nutrition plan and how food can affect mood as well to support emotional wellbeing. We will continue to monitor closely alongside PCP / other specialists. Will refill Wellbutrin.   Prediabetes Assessment & Plan: Lab Results  Component Value Date   HGBA1C 5.7 (H) 02/13/2023   HGBA1C 5.2 03/05/2018   HGBA1C 5.2 10/31/2017   INSULIN 19.7 02/13/2023   INSULIN 11.9 03/05/2018   INSULIN 20.2 10/31/2017   As of her latest blood work on 02/13/23 her fasting A1C was 19.7 and fasting insulin was 5.7. Leah Brown is currently taking Metformin 500 mg. She endorses diarrhea and GI upset many times while at work. As a result, she is interested in  possible changing the time of day she takes her Metformin.   I recommend she take Metformin twice daily, once with lunch and once with dinner. We discussed in detail the benefits of eating on-plan while taking Metformin. We also discussed the possible side effects of eating off-plan, including possible GI upset when eating high carbs. Othel will continue to work on weight loss, exercise, via their meal plan we devised to help decrease the risk of progressing to diabetes. We will continue to monitor her condition as it pertains to her weight loss journey. Will refill Metformin today.    Vitamin D deficiency Assessment & Plan: Lab Results  Component Value Date   VD25OH 24.1 (L) 02/13/2023   VD25OH 39.0 03/05/2018   VD25OH 28.3 (L) 10/31/2017   As of 02/13/23 her vitamin D levels were below goal at 24.1. Leah Brown is treating her condition with ERGO 50K units once weekly. Tolerating well with no side effects. She reports not noticing any increase in energy since starting this supplementation. Will refill ERGO today.    Obesity, Beginning BMI 02/13/23 40.04 BMI 39.0-39.9,adult- current BMI 37.56 Assessment & Plan: Leah Brown is here to discuss her progress with her obesity treatment plan along with follow-up of her obesity related diagnoses. See Medical Weight Management Flowsheet for complete bioelectrical impedance results.  Since last office visit on 04/25/23  patient's muscle mass had no change. Fat mass has decreased by 1 lb. Total body water has decreased by 0.6 lbs.  Counseling done on how various foods will affect these numbers and how to maximize success.  Total lbs lost to date:  16 lbs Total weight loss percentage to date:  -7.02%   She reports being tired of eating the same foods every meal with varying meats and steamed vegetables. She noted that she will be hosting a party that will have many unhealthy foods available.   Patient was counseled on healthy alternative recipes to  her favorite foods that are on-plan and add healthy variation to her meals. These suggestions include low fat and low calorie food alternatives. We reviewed many recipes to use as guides. We briefly discussed using Weight Watchers recipes online as a guide as well, making sure to emphasize the lower point recipes are most healthy. I recommend she increase her protein intake by eating high protein sources such as tuna, swordfish, and grilled proteins. For attending her party, I advised she eat lunch prior to the party and keep a protein bar or drink as a back-up for just in case she has temptations.   No change to meal plan - see Subjective  Behavioral Intervention Additional resources provided today: Recipes - multiple handout and online sources.  Evidence-based interventions for health behavior change were utilized today including the discussion of self monitoring techniques, problem-solving barriers and SMART goal setting techniques.   Regarding patient's less desirable eating habits and patterns, we employed the technique of small changes.  Pt will specifically work on: Walk 15+ mins 5 days a week at lunchtime and try healthy alternatives to favorite foods  for next visit.    FOLLOW UP: Return in about 4 weeks (around 06/05/2023). She was informed of the importance of frequent follow up visits to maximize her success with intensive lifestyle modifications for her multiple health conditions.  Subjective:   Chief complaint: Obesity Leah Brown is here to discuss her progress with her obesity treatment plan. She is on the Category 2 Plan with 100 snack calories and states she is following her eating plan approximately 30% of the time. She states she is walking 15 minutes 2-3 days per week.  Interval History:  Leah Brown is here for a follow up office visit. Since last OV,  she is doing overall well. She tends to eat beef 2 times a week (lean hamburger meat or steak) and will eat shrimp or  chicken other nights. Has meat and steamed vegetables for most meals and reports she is tired of eating the same foods. Has been trying to make healthy choices when eating out. Recently ate out with colleagues and had soup and quiche due to not knowing what other healthy alternatives to eat that are on-plan.   Pharmacotherapy for weight loss: She is currently taking Wellbutrin and Metformin  for medical weight loss.  Denies side effects.    Review of Systems:  Pertinent positives were addressed with patient today.  Reviewed by clinician on day of visit: allergies, medications, problem list, medical history, surgical history, family history, social history, and previous encounter notes.  Weight Summary and Biometrics   Weight Lost Since Last Visit: 1lb  Weight Gained Since Last Visit: 0lb    Vitals Temp: 99.3 F (37.4 C) BP: 107/74 Pulse Rate: 73 SpO2: 97 %   Anthropometric Measurements Height: 5\' 3"  (1.6 m) Weight: 212 lb (96.2 kg) BMI (Calculated): 37.56 Weight at Last  Visit: 213lb Weight Lost Since Last Visit: 1lb Weight Gained Since Last Visit: 0lb Starting Weight: 228lb Total Weight Loss (lbs): 15 lb (6.804 kg) Peak Weight: 235lb   Body Composition  Body Fat %: 43.5 % Fat Mass (lbs): 92.4 lbs Muscle Mass (lbs): 114.2 lbs Total Body Water (lbs): 82.4 lbs Visceral Fat Rating : 11   Other Clinical Data Fasting: no Labs: no Today's Visit #: 7 Starting Date: 02/13/23    Objective:   PHYSICAL EXAM: Blood pressure 107/74, pulse 73, temperature 99.3 F (37.4 C), height 5\' 3"  (1.6 m), weight 212 lb (96.2 kg), SpO2 97%. Body mass index is 37.55 kg/m.  General: Well Developed, well nourished, and in no acute distress.  HEENT: Normocephalic, atraumatic Skin: Warm and dry, cap RF less 2 sec, good turgor Chest:  Normal excursion, shape, no gross abn Respiratory: speaking in full sentences, no conversational dyspnea NeuroM-Sk: Ambulates w/o assistance, moves *  4 Psych: A and O *3, insight good, mood-full  DIAGNOSTIC DATA REVIEWED:  BMET    Component Value Date/Time   NA 139 02/13/2023 0859   NA 138 06/11/2013 1014   K 4.5 02/13/2023 0859   K 3.8 06/11/2013 1014   CL 102 02/13/2023 0859   CL 102 06/11/2013 1014   CO2 22 02/13/2023 0859   CO2 27 06/11/2013 1014   GLUCOSE 100 (H) 02/13/2023 0859   GLUCOSE 96 06/25/2017 1205   GLUCOSE 96 06/11/2013 1014   BUN 8 02/13/2023 0859   BUN 11 06/11/2013 1014   CREATININE 0.79 02/13/2023 0859   CREATININE 0.72 06/25/2017 1205   CALCIUM 9.3 02/13/2023 0859   CALCIUM 8.7 06/11/2013 1014   GFRNONAA 97 03/05/2018 0835   GFRNONAA 108 06/25/2017 1205   GFRAA 111 03/05/2018 0835   GFRAA 126 06/25/2017 1205   Lab Results  Component Value Date   HGBA1C 5.7 (H) 02/13/2023   HGBA1C 5.2 10/31/2017   Lab Results  Component Value Date   INSULIN 19.7 02/13/2023   INSULIN 20.2 10/31/2017   Lab Results  Component Value Date   TSH 1.630 02/13/2023   CBC    Component Value Date/Time   WBC 7.1 02/13/2023 0859   WBC 8.4 06/25/2017 1205   RBC 4.61 02/13/2023 0859   RBC 4.51 06/25/2017 1205   HGB 14.0 02/13/2023 0859   HCT 42.7 02/13/2023 0859   PLT 289 02/13/2023 0859   MCV 93 02/13/2023 0859   MCV 91 06/11/2013 1014   MCH 30.4 02/13/2023 0859   MCH 30.4 06/25/2017 1205   MCHC 32.8 02/13/2023 0859   MCHC 33.7 06/25/2017 1205   RDW 13.2 02/13/2023 0859   RDW 12.5 06/11/2013 1014   Iron Studies No results found for: "IRON", "TIBC", "FERRITIN", "IRONPCTSAT" Lipid Panel     Component Value Date/Time   CHOL 201 (H) 02/13/2023 0859   CHOL 175 06/11/2013 1014   TRIG 109 02/13/2023 0859   TRIG 52 06/11/2013 1014   HDL 55 02/13/2023 0859   HDL 42 06/11/2013 1014   CHOLHDL 3.5 12/13/2016 0902   VLDL 13 12/13/2016 0902   VLDL 10 06/11/2013 1014   LDLCALC 127 (H) 02/13/2023 0859   LDLCALC 123 (H) 06/11/2013 1014   Hepatic Function Panel     Component Value Date/Time   PROT 7.0  02/13/2023 0859   PROT 7.3 06/11/2013 1014   ALBUMIN 4.3 02/13/2023 0859   ALBUMIN 3.9 06/11/2013 1014   AST 17 02/13/2023 0859   AST 13 (L) 06/11/2013 1014   ALT  20 02/13/2023 0859   ALT 21 06/11/2013 1014   ALKPHOS 59 02/13/2023 0859   ALKPHOS 52 06/11/2013 1014   BILITOT 0.3 02/13/2023 0859   BILITOT 0.4 06/11/2013 1014      Component Value Date/Time   TSH 1.630 02/13/2023 0859   Nutritional Lab Results  Component Value Date   VD25OH 24.1 (L) 02/13/2023   VD25OH 39.0 03/05/2018   VD25OH 28.3 (L) 10/31/2017    Attestations:   Reviewed by clinician on day of visit: allergies, medications, problem list, medical history, surgical history, family history, social history, and previous encounter notes pertinent to patient's obesity diagnosis.    This encounter took 40 total minutes of time including time coordinating the above treatment plan, any pre-visit chart review and post-visit documentation and reviewing any labs and/or imaging, reviewing pertinent prior notes, and providing counseling the patient about such treatment plan.  Approximately 50% of this time was spent in direct, face-to-face counseling and coordination of care.  I, Isabelle Course, acting as a medical scribe for Thomasene Lot, DO., have compiled all relevant documentation for today's office visit on behalf of Thomasene Lot, DO, while in the presence of [employer], DO.  I have reviewed the above documentation for accuracy and completeness, and I agree with the above. [name], D.O.  The 21st Century Cures Act was signed into law in 2016 which includes the topic of electronic health records.  This provides immediate access to information in MyChart.  This includes consultation notes, operative notes, office notes, lab results and pathology reports.  If you have any questions about what you read please let us know at your next visit so we can discuss your concerns and take corrective action if need be.   We are right here with you.

## 2023-05-21 ENCOUNTER — Telehealth (INDEPENDENT_AMBULATORY_CARE_PROVIDER_SITE_OTHER): Payer: BC Managed Care – PPO | Admitting: Psychology

## 2023-06-05 ENCOUNTER — Encounter (INDEPENDENT_AMBULATORY_CARE_PROVIDER_SITE_OTHER): Payer: Self-pay | Admitting: Family Medicine

## 2023-06-05 ENCOUNTER — Ambulatory Visit (INDEPENDENT_AMBULATORY_CARE_PROVIDER_SITE_OTHER): Payer: BC Managed Care – PPO | Admitting: Family Medicine

## 2023-06-05 VITALS — BP 121/78 | HR 74 | Temp 99.2°F | Ht 63.0 in | Wt 212.0 lb

## 2023-06-05 DIAGNOSIS — E559 Vitamin D deficiency, unspecified: Secondary | ICD-10-CM | POA: Diagnosis not present

## 2023-06-05 DIAGNOSIS — Z6837 Body mass index (BMI) 37.0-37.9, adult: Secondary | ICD-10-CM

## 2023-06-05 DIAGNOSIS — E669 Obesity, unspecified: Secondary | ICD-10-CM

## 2023-06-05 DIAGNOSIS — Z6839 Body mass index (BMI) 39.0-39.9, adult: Secondary | ICD-10-CM

## 2023-06-05 DIAGNOSIS — R7303 Prediabetes: Secondary | ICD-10-CM

## 2023-06-05 DIAGNOSIS — F39 Unspecified mood [affective] disorder: Secondary | ICD-10-CM

## 2023-06-05 MED ORDER — BUPROPION HCL ER (SR) 150 MG PO TB12: 150.0000 mg | ORAL_TABLET | Freq: Two times a day (BID) | ORAL | 0 refills | Status: DC

## 2023-06-05 MED ORDER — METFORMIN HCL 500 MG PO TABS: ORAL_TABLET | ORAL | 0 refills | Status: DC

## 2023-06-05 NOTE — Progress Notes (Signed)
Leah Brown, D.O.  ABFM, ABOM Specializing in Clinical Bariatric Medicine  Office located at: [address] [place], [place]  [address]   Assessment and Plan:   FOR THE DISEASE OF OBESITY: BMI 39.0-39.9,adult- current BMI 37.56 Obesity, Beginning BMI 02/13/23 40.04 Since last office visit on 05/08/2023 patient's  Muscle mass has increased by 0.8lb. Fat mass has decreased by 1.4lb. Total body water has increased by 0.2lb.  Counseling done on how various foods will affect these numbers and how to maximize success  Total lbs lost to date: 16 Total weight loss percentage to date: 7.02    Recommended Dietary Goals Letticia is currently in the action stage of change. As such, her goal is to continue weight management plan.  She has agreed to: continue current plan   Behavioral Intervention We discussed the following today: increasing lean protein intake to established goals, work on meal planning and preparation, work on tracking and journaling calories using tracking application, continue to work on implementation of reduced calorie nutritional plan, and discussed pre-packaged healthier meals such as [employer] All Natural, Whole 30 chicken meals, Longlife meal prep and Factor Meals etc  Additional resources provided today: Handout on traveling and holiday eating strategies and thanksgiving eating strategies   Evidence-based interventions for health behavior change were utilized today including the discussion of self monitoring techniques, problem-solving barriers and SMART goal setting techniques.   Regarding patient's less desirable eating habits and patterns, we employed the technique of small changes.   Pt will specifically work on: Walking 4 days a week for next visit.    Recommended Physical Activity Goals [name] has been advised to work up to 150 minutes of moderate intensity aerobic activity a week and strengthening exercises 2-3 times per week for cardiovascular  health, weight loss maintenance and preservation of muscle mass.   She has agreed to :  Continue current level of physical activity , Increase the intensity, frequency or duration of aerobic exercises  , and continue to gradually increase the amount and intensity of exercise    Pharmacotherapy We discussed various medication options to help [name] with her weight loss efforts and we both agreed to : continue with nutritional and behavioral strategies and continue current anti-obesity medication regimen   FOR ASSOCIATED CONDITIONS ADDRESSED TODAY: Vitamin D deficiency Assessment: Condition is Not optimized.. Patient reports good compliance and tolerance of taking Ergocalciferol 50K IU weekly. She tolerates this without difficulty and denies any side effects especially regarding her bone health. She has noticed an increase in energy.   Plan:  - Continue Ergocalciferol 50K IU weekly and she was encouraged to continue to take the medicine until told otherwise. weight loss will likely improve availability of vitamin D, thus encouraged [name] to continue with meal plan and their weight loss efforts to further improve this condition.    Mood disorder (HCC)- Emotional eating Assessment:  Condition is optimized at the moment. Denies any SI/HI. Mood is stable. Cravings and hunger are better controlled. Pt endorses the Wellbutrin is working well and this gives her a little energy and focus. She notes wanting to increase her Wellbutrin and decrease her Celexa at some point. She notes decreasing her intake on Trazodone.   Plan:  - I advised her to take decrease her Celexa from 20mg  to 10mg  and increase her Wellbutrin to 150mg  BID. Reminded patient of the importance of following their prudent nutrition plan and how food can affect mood as well to support emotional wellbeing.  Mood disorder (HCC)- Emotional eating -     buPROPion HCl ER (SR); Take 1 tablet (150 mg total) by mouth 2 (two) times daily.   Dispense: 60 tablet; Refill: 0   Prediabetes Assessment:  Condition is Not optimized.. Patient reports good compliance and tolerance of taking Metformin since changing how she takes this. She notes her GI upset has improved she still experiences diarrhea but depending on what she eats this is improved or worse.  However, her hunger and cravings are better controlled.    Plan:  - Switch Metformin from 1 at breakfast and 2 at lunch daily to help alleviate her GI upset. Continue to decrease simple carbs/ sugars; increase fiber and proteins -> follow her meal plan. [name] will continue to work on weight loss, exercise, via their meal plan we devised to help decrease the risk of progressing to diabetes. Anticipatory guidance given.   Prediabetes -     metFORMIN HCl; 1 po breakfast and 2 po lunch daily  Dispense: 90 tablet; Refill: 0   Follow up:   Return in about 3 weeks (around 06/26/2023). She was informed of the importance of frequent follow up visits to maximize her success with intensive lifestyle modifications for her multiple health conditions.  Subjective:   Chief complaint: Obesity [name] is here to discuss her progress with her obesity treatment plan. She is on the the Category 2 Plan with 100 snack calories and states she is following her eating plan approximately 10% of the time. She states she is exercising 15 minutes 2 days per week.  Interval History:  [name] is here for a follow up office visit. Since last OV,  she has been well. She informed me that she has not been following the meal plan and has been eating healthy foods how she wants. She notes wanting to try and journal.   Barriers identified: none.   Pharmacotherapy for weight loss: She is currently taking Metformin (off label use for incretin effect and / or insulin resistance and / or diabetes prevention) with adequate clinical response  and without side effects..   Review of Systems:  Pertinent  positives were addressed with patient today.  Reviewed by clinician on day of visit: allergies, medications, problem list, medical history, surgical history, family history, social history, and previous encounter notes.  Weight Summary and Biometrics   Weight Lost Since Last Visit: 0lb  Weight Gained Since Last Visit: 0lb    Vitals Temp: 99.2 F (37.3 C) BP: 121/78 Pulse Rate: 74 SpO2: 98 %   Anthropometric Measurements Height: 5\' 3"  (1.6 m) Weight: 212 lb (96.2 kg) BMI (Calculated): 37.56 Weight at Last Visit: 212lb Weight Lost Since Last Visit: 0lb Weight Gained Since Last Visit: 0lb Starting Weight: 228lb Total Weight Loss (lbs): 16 lb (7.258 kg) Peak Weight: 235lb   Body Composition  Body Fat %: 42.9 % Fat Mass (lbs): 91 lbs Muscle Mass (lbs): 115 lbs Total Body Water (lbs): 82.6 lbs Visceral Fat Rating : 11   Other Clinical Data Fasting: no Labs: no Today's Visit #: 8 Starting Date: 02/13/23    Objective:   PHYSICAL EXAM: Blood pressure 121/78, pulse 74, temperature 99.2 F (37.3 C), height 5\' 3"  (1.6 m), weight 212 lb (96.2 kg), SpO2 98%. Body mass index is 37.55 kg/m.  General: she is overweight, cooperative and in no acute distress. PSYCH: Has normal mood, affect and thought process.   HEENT: EOMI, sclerae are anicteric. Lungs: Normal breathing effort, no conversational dyspnea.  Extremities: Moves * 4 Neurologic: A and O * 3, good insight  DIAGNOSTIC DATA REVIEWED: BMET    Component Value Date/Time   NA 139 02/13/2023 0859   NA 138 06/11/2013 1014   K 4.5 02/13/2023 0859   K 3.8 06/11/2013 1014   CL 102 02/13/2023 0859   CL 102 06/11/2013 1014   CO2 22 02/13/2023 0859   CO2 27 06/11/2013 1014   GLUCOSE 100 (H) 02/13/2023 0859   GLUCOSE 96 06/25/2017 1205   GLUCOSE 96 06/11/2013 1014   BUN 8 02/13/2023 0859   BUN 11 06/11/2013 1014   CREATININE 0.79 02/13/2023 0859   CREATININE 0.72 06/25/2017 1205   CALCIUM 9.3 02/13/2023 0859    CALCIUM 8.7 06/11/2013 1014   GFRNONAA 97 03/05/2018 0835   GFRNONAA 108 06/25/2017 1205   GFRAA 111 03/05/2018 0835   GFRAA 126 06/25/2017 1205   Lab Results  Component Value Date   HGBA1C 5.7 (H) 02/13/2023   HGBA1C 5.2 10/31/2017   Lab Results  Component Value Date   INSULIN 19.7 02/13/2023   INSULIN 20.2 10/31/2017   Lab Results  Component Value Date   TSH 1.630 02/13/2023   CBC    Component Value Date/Time   WBC 7.1 02/13/2023 0859   WBC 8.4 06/25/2017 1205   RBC 4.61 02/13/2023 0859   RBC 4.51 06/25/2017 1205   HGB 14.0 02/13/2023 0859   HCT 42.7 02/13/2023 0859   PLT 289 02/13/2023 0859   MCV 93 02/13/2023 0859   MCV 91 06/11/2013 1014   MCH 30.4 02/13/2023 0859   MCH 30.4 06/25/2017 1205   MCHC 32.8 02/13/2023 0859   MCHC 33.7 06/25/2017 1205   RDW 13.2 02/13/2023 0859   RDW 12.5 06/11/2013 1014   Iron Studies No results found for: "IRON", "TIBC", "FERRITIN", "IRONPCTSAT" Lipid Panel     Component Value Date/Time   CHOL 201 (H) 02/13/2023 0859   CHOL 175 06/11/2013 1014   TRIG 109 02/13/2023 0859   TRIG 52 06/11/2013 1014   HDL 55 02/13/2023 0859   HDL 42 06/11/2013 1014   CHOLHDL 3.5 12/13/2016 0902   VLDL 13 12/13/2016 0902   VLDL 10 06/11/2013 1014   LDLCALC 127 (H) 02/13/2023 0859   LDLCALC 123 (H) 06/11/2013 1014   Hepatic Function Panel     Component Value Date/Time   PROT 7.0 02/13/2023 0859   PROT 7.3 06/11/2013 1014   ALBUMIN 4.3 02/13/2023 0859   ALBUMIN 3.9 06/11/2013 1014   AST 17 02/13/2023 0859   AST 13 (L) 06/11/2013 1014   ALT 20 02/13/2023 0859   ALT 21 06/11/2013 1014   ALKPHOS 59 02/13/2023 0859   ALKPHOS 52 06/11/2013 1014   BILITOT 0.3 02/13/2023 0859   BILITOT 0.4 06/11/2013 1014      Component Value Date/Time   TSH 1.630 02/13/2023 0859   Nutritional Lab Results  Component Value Date   VD25OH 24.1 (L) 02/13/2023   VD25OH 39.0 03/05/2018   VD25OH 28.3 (L) 10/31/2017    Attestations:   I, Medical illustrator, acting as a Stage manager for [employer], DO., have compiled all relevant documentation for today's office visit on behalf of Thomasene Lot, DO, while in the presence of [employer], DO.  Reviewed by clinician on day of visit: allergies, medications, problem list, medical history, surgical history, family history, social history, and previous encounter notes pertinent to patient's obesity diagnosis. preparing to see patient (e.g. review and interpretation of tests, old notes ), obtaining  and/or reviewing separately obtained history, performing a medically appropriate examination or evaluation, counseling and educating the patient, ordering medications, test or procedures, documenting clinical information in the electronic or other health care record, and independently interpreting results and communicating results to the patient, family, or caregiver   I have reviewed the above documentation for accuracy and completeness, and I agree with the above. Leah Brown, D.O.  The 21st Century Cures Act was signed into law in 2016 which includes the topic of electronic health records.  This provides immediate access to information in MyChart.  This includes consultation notes, operative notes, office notes, lab results and pathology reports.  If you have any questions about what you read please let us know at your next visit so we can discuss your concerns and take corrective action if need be.  We are right here with you.

## 2023-06-18 ENCOUNTER — Telehealth (INDEPENDENT_AMBULATORY_CARE_PROVIDER_SITE_OTHER): Payer: BC Managed Care – PPO | Admitting: Psychology

## 2023-06-18 DIAGNOSIS — F5089 Other specified eating disorder: Secondary | ICD-10-CM | POA: Diagnosis not present

## 2023-06-18 NOTE — Progress Notes (Signed)
  Office: [phone]  /  Fax: [phone]    Date: June 18, 2023  Appointment Start Time: 8:02am Duration: 33 minutes Provider: Lawerance Cruel, Psy.D. Type of Session: Individual Therapy  Location of Patient: Home (private location) Location of Provider: Provider's Home (private office) Type of Contact: Telepsychological Visit via MyChart Video Visit  Session Content: Leah Brown is a 42 y.o. female presenting for a follow-up appointment to address the previously established treatment goal of increasing coping skills.Today's appointment was a telepsychological visit. Leah Brown provided verbal consent for today's telepsychological appointment and she is aware she is responsible for securing confidentiality on her end of the session. Prior to proceeding with today's appointment, Leah Brown's physical location at the time of this appointment was obtained as well a phone number she could be reached at in the event of technical difficulties. Leah Brown and this provider participated in today's telepsychological service.   This provider conducted a brief check-in. Leah Brown shared she started journaling using [employer], adding, "That's been very eye opening."  She described experiencing all or nothing thinking. Examples explored and processed. Reviewed self-compassion. This provider and Leah Brown explored the story her mind tells her about her weight loss/eating habits that is stuck on repeat. Leah Brown of the appointment focused on re-writing a more compassionate version. Overall, Leah Brown was receptive to today's appointment as evidenced by openness to sharing, responsiveness to feedback, and willingness to work toward increasing self-compassion.  Mental Status Examination:  Appearance: neat Behavior: appropriate to circumstances Mood: neutral Affect: mood congruent Speech: WNL Eye Contact: appropriate Psychomotor Activity: WNL Gait: unable to assess Thought Process: linear, logical, and goal directed and no evidence or  endorsement of suicidal, homicidal, and self-harm ideation, plan and intent  Thought Content/Perception: no hallucinations, delusions, bizarre thinking or behavior endorsed or observed Orientation: AAOx4 Memory/Concentration: intact Insight: fair Judgment: fair  Interventions:  Conducted a brief chart review Provided empathic reflections and validation Reviewed content from the previous session Provided positive reinforcement Employed supportive psychotherapy interventions to facilitate reduced distress and to improve coping skills with identified stressors Psychoeducation provided regarding all-or-nothing thinking Engaged pt in a self-compassion exercise  DSM-5 Diagnosis(es): F50.89 Other Specified Feeding or Eating Disorder, Emotional and Binge Eating Behaviors  Treatment Goal & Progress: When re-initiating services with this provider, the following treatment goal was established: increase coping skills. Leah Brown has demonstrated progress in her goal as evidenced by increased awareness of hunger patterns and triggers for emotional eating behaviors. Leah Brown also continues to demonstrate willingness to engage in learned skill(s).  Plan: The next appointment is scheduled for 07/09/2023 at 12:30pm, which will be via MyChart Video Visit. The next session will focus on working towards the established treatment goal. Leah Brown will continue with her primary therapist.

## 2023-06-25 ENCOUNTER — Telehealth (INDEPENDENT_AMBULATORY_CARE_PROVIDER_SITE_OTHER): Payer: Self-pay | Admitting: Family Medicine

## 2023-06-25 ENCOUNTER — Other Ambulatory Visit (INDEPENDENT_AMBULATORY_CARE_PROVIDER_SITE_OTHER): Payer: Self-pay

## 2023-06-25 DIAGNOSIS — E559 Vitamin D deficiency, unspecified: Secondary | ICD-10-CM

## 2023-06-25 MED ORDER — VITAMIN D (ERGOCALCIFEROL) 1.25 MG (50000 UNIT) PO CAPS
50000.0000 [IU] | ORAL_CAPSULE | ORAL | 0 refills | Status: DC
Start: 2023-06-25 — End: 2023-07-17

## 2023-06-25 NOTE — Telephone Encounter (Signed)
Called pt to reschedule her 12/18 appt D/T Dr. Sharee Holster not in clinic, pt wants to just cancel and keep her 07/17/23 appointment but she needs a refill on her Vitamin D.  Please send refill to [facility] on [address] In [place].

## 2023-06-26 ENCOUNTER — Ambulatory Visit (INDEPENDENT_AMBULATORY_CARE_PROVIDER_SITE_OTHER): Payer: BC Managed Care – PPO | Admitting: Family Medicine

## 2023-07-09 ENCOUNTER — Telehealth (INDEPENDENT_AMBULATORY_CARE_PROVIDER_SITE_OTHER): Payer: BC Managed Care – PPO | Admitting: Psychology

## 2023-07-09 DIAGNOSIS — F5089 Other specified eating disorder: Secondary | ICD-10-CM | POA: Diagnosis not present

## 2023-07-09 NOTE — Progress Notes (Signed)
  Office: [phone]  /  Fax: [phone]    Date: July 09, 2023  Appointment Start Time: 12:32pm Duration: 31 minutes Provider: Wyatt Fire, Psy.D. Type of Session: Individual Therapy  Location of Patient: Home (private location) Location of Provider: Provider's Home (private office) Type of Contact: Telepsychological Visit via MyChart Video Visit  Session Content: Leah Brown is a 42 y.o. female presenting for a follow-up appointment to address the previously established treatment goal of increasing coping skills.Today's appointment was a telepsychological visit. Leah Brown provided verbal consent for today's telepsychological appointment and she is aware she is responsible for securing confidentiality on her end of the session. Prior to proceeding with today's appointment, Leah Brown's physical location at the time of this appointment was obtained as well a phone number she could be reached at in the event of technical difficulties. Leah Brown and this provider participated in today's telepsychological service.   This provider conducted a brief check-in. Leah Brown shared about recent events. Reflected on what went well as it relates to eating during the holidays (e.g., journaling regularly) and what she can do differently in the future. Reviewed self-compassion story discussed during the last appointment. Leah Brown disclosed that in the past the new year has been a tough time as she thinks about what she should have done. Further explored and processed. Reflected on progress to date. Overall, Leah Brown was receptive to today's appointment as evidenced by openness to sharing, responsiveness to feedback, and willingness to continue engaging in learned skills.  Mental Status Examination:  Appearance: neat Behavior: appropriate to circumstances Mood: neutral Affect: mood congruent Speech: WNL Eye Contact: appropriate Psychomotor Activity: WNL Gait: unable to assess Thought Process: linear, logical, and goal  directed and no evidence or endorsement of suicidal, homicidal, and self-harm ideation, plan and intent  Thought Content/Perception: no hallucinations, delusions, bizarre thinking or behavior endorsed or observed Orientation: AAOx4 Memory/Concentration: intact Insight: good Judgment: good  Interventions:  Conducted a brief chart review Provided empathic reflections and validation Reviewed content from the previous session Provided positive reinforcement Employed supportive psychotherapy interventions to facilitate reduced distress and to improve coping skills with identified stressors  DSM-5 Diagnosis(es): F50.89 Other Specified Feeding or Eating Disorder, Emotional and Binge Eating Behaviors  Treatment Goal & Progress: When re-initiating services with this provider, the following treatment goal was established: increase coping skills. Leah Brown has demonstrated progress in her goal as evidenced by increased awareness of hunger patterns and triggers for emotional eating behaviors. Leah Brown also continues to demonstrate willingness to engage in learned skill(s).   Plan: Due to Leah Brown's upcoming vacation, the next appointment is scheduled for 08/12/2023 at 11am, which will be via MyChart Video Visit. The next session will focus on working towards the established treatment goal. Leah Brown will continue with her primary therapist.     Wyatt Fire, PsyD

## 2023-07-13 ENCOUNTER — Other Ambulatory Visit (INDEPENDENT_AMBULATORY_CARE_PROVIDER_SITE_OTHER): Payer: Self-pay | Admitting: Family Medicine

## 2023-07-13 DIAGNOSIS — R7303 Prediabetes: Secondary | ICD-10-CM

## 2023-07-17 ENCOUNTER — Telehealth (INDEPENDENT_AMBULATORY_CARE_PROVIDER_SITE_OTHER): Payer: 59 | Admitting: Internal Medicine

## 2023-07-17 ENCOUNTER — Encounter (INDEPENDENT_AMBULATORY_CARE_PROVIDER_SITE_OTHER): Payer: Self-pay | Admitting: Family Medicine

## 2023-07-17 ENCOUNTER — Encounter: Payer: Self-pay | Admitting: Internal Medicine

## 2023-07-17 ENCOUNTER — Ambulatory Visit (INDEPENDENT_AMBULATORY_CARE_PROVIDER_SITE_OTHER): Payer: Self-pay | Admitting: Family Medicine

## 2023-07-17 DIAGNOSIS — E559 Vitamin D deficiency, unspecified: Secondary | ICD-10-CM | POA: Diagnosis not present

## 2023-07-17 DIAGNOSIS — E669 Obesity, unspecified: Secondary | ICD-10-CM

## 2023-07-17 DIAGNOSIS — F39 Unspecified mood [affective] disorder: Secondary | ICD-10-CM

## 2023-07-17 DIAGNOSIS — T753XXA Motion sickness, initial encounter: Secondary | ICD-10-CM | POA: Diagnosis not present

## 2023-07-17 DIAGNOSIS — R7303 Prediabetes: Secondary | ICD-10-CM | POA: Diagnosis not present

## 2023-07-17 DIAGNOSIS — Z6839 Body mass index (BMI) 39.0-39.9, adult: Secondary | ICD-10-CM

## 2023-07-17 DIAGNOSIS — Z6837 Body mass index (BMI) 37.0-37.9, adult: Secondary | ICD-10-CM

## 2023-07-17 DIAGNOSIS — F5089 Other specified eating disorder: Secondary | ICD-10-CM

## 2023-07-17 MED ORDER — SCOPOLAMINE 1 MG/3DAYS TD PT72: 1.0000 | MEDICATED_PATCH | TRANSDERMAL | 0 refills | Status: DC

## 2023-07-17 MED ORDER — BUPROPION HCL ER (SR) 150 MG PO TB12: 150.0000 mg | ORAL_TABLET | Freq: Two times a day (BID) | ORAL | 0 refills | Status: DC

## 2023-07-17 MED ORDER — VITAMIN D (ERGOCALCIFEROL) 1.25 MG (50000 UNIT) PO CAPS
50000.0000 [IU] | ORAL_CAPSULE | ORAL | 0 refills | Status: DC
Start: 2023-07-17 — End: 2023-08-26

## 2023-07-17 NOTE — Progress Notes (Signed)
 Date:  07/17/2023   Name:  Leah Brown   DOB:  1981-03-17   MRN:  [id number]  This encounter was conducted via video encounter. This platform was deemed appropriate for the issues to be addressed.  The patient was correctly identified.  I advised that I am conducting the visit from a secure room in my office at [facility] clinic.  The patient is located at home. The limitations of this form of encounter were discussed with the patient and he/she agreed to proceed.  Some vital signs will be absent.  Chief Complaint: Medication Management (Patient going on a cruise in 2 weeks [place]. Patient would like sea-sickness patches for this cruise.) She has motion sickness in the car and on boats and wants to enjoy her cruise.  She has used patches in the past with good benefit. HPI  Review of Systems  Respiratory:  Negative for chest tightness and shortness of breath.   Cardiovascular:  Negative for chest pain.  Neurological:  Negative for dizziness and headaches.     Lab Results  Component Value Date   NA 139 02/13/2023   K 4.5 02/13/2023   CO2 22 02/13/2023   GLUCOSE 100 (H) 02/13/2023   BUN 8 02/13/2023   CREATININE 0.79 02/13/2023   CALCIUM 9.3 02/13/2023   EGFR 96 02/13/2023   GFRNONAA 97 03/05/2018   Lab Results  Component Value Date   CHOL 201 (H) 02/13/2023   HDL 55 02/13/2023   LDLCALC 127 (H) 02/13/2023   TRIG 109 02/13/2023   CHOLHDL 3.5 12/13/2016   Lab Results  Component Value Date   TSH 1.630 02/13/2023   Lab Results  Component Value Date   HGBA1C 5.7 (H) 02/13/2023   Lab Results  Component Value Date   WBC 7.1 02/13/2023   HGB 14.0 02/13/2023   HCT 42.7 02/13/2023   MCV 93 02/13/2023   PLT 289 02/13/2023   Lab Results  Component Value Date   ALT 20 02/13/2023   AST 17 02/13/2023   ALKPHOS 59 02/13/2023   BILITOT 0.3 02/13/2023   Lab Results  Component Value Date   VD25OH 24.1 (L) 02/13/2023     Patient Active Problem List    Diagnosis Date Noted   BMI 39.0-39.9,adult 03/13/2023   BRCA gene mutation negative in female 03/06/2023   PCOS (polycystic ovarian syndrome) 03/06/2023   Gastroesophageal reflux disease 02/26/2022   Depression 11/18/2017   Vitamin D  deficiency 12/13/2016   Other seasonal allergic rhinitis 12/03/2015   Morbid obesity (HCC) 12/03/2015   Family hx of ovarian malignancy 12/03/2015    Allergies  Allergen Reactions   Almond (Diagnostic) Hives and Swelling   Celery Oil Swelling   Codeine Nausea Only   Peanut (Diagnostic) Other (See Comments)    Allergy testing   Peanut-Containing Drug Products Other (See Comments)    Allergy testing    Pollen Extract Swelling   Tree Extract Hives and Swelling    Past Surgical History:  Procedure Laterality Date   CHOLECYSTECTOMY, LAPAROSCOPIC  02/24/2021   SKIN CANCER DESTRUCTION Left 2008   Breast   WISDOM TOOTH EXTRACTION      Social History   Tobacco Use   Smoking status: Former    Current packs/day: 0.00    Average packs/day: 0.5 packs/day for 4.0 years (2.0 ttl pk-yrs)    Types: Cigarettes    Start date: 07/10/1999    Quit date: 07/10/2003    Years since quitting: 20.0   Smokeless tobacco:  Never  Vaping Use   Vaping status: Never Used  Substance Use Topics   Alcohol use: Yes    Alcohol/week: 2.0 standard drinks of alcohol    Types: 1 Glasses of wine, 1 Cans of beer per week    Comment: socially   Drug use: No     Medication list has been reviewed and updated.  Current Meds  Medication Sig   buPROPion  (WELLBUTRIN  SR) 150 MG 12 hr tablet Take 1 tablet (150 mg total) by mouth 2 (two) times daily.   caffeine 200 MG TABS tablet Take 100 mg by mouth daily as needed.   cetirizine (ZYRTEC) 10 MG tablet Take 10 mg by mouth as needed.    citalopram  (CELEXA ) 20 MG tablet Take 10 mg by mouth daily.   metFORMIN  (GLUCOPHAGE ) 500 MG tablet 1 po breakfast and 2 po lunch daily   Multiple Vitamin (MULTIVITAMIN WITH MINERALS) TABS tablet Take  1 tablet by mouth daily.   Norgestimate-Ethinyl Estradiol Triphasic (TRI-LO-MARZIA) 0.18/0.215/0.25 MG-25 MCG tab Take 1 tablet by mouth daily.   omeprazole  (PRILOSEC OTC) 20 MG tablet Take 20 mg by mouth daily.   scopolamine  (TRANSDERM SCOP , 1.5 MG,) 1 MG/3DAYS Place 1 patch (1.5 mg total) onto the skin every 3 (three) days.   traZODone  (DESYREL ) 50 MG tablet Take 50 mg by mouth at bedtime.   Vitamin D , Ergocalciferol , (DRISDOL ) 1.25 MG (50000 UNIT) CAPS capsule Take 1 capsule (50,000 Units total) by mouth every 7 (seven) days.       07/17/2023    2:47 PM 03/06/2023    2:53 PM 03/11/2018   11:16 AM 02/17/2018   11:13 AM  GAD 7 : Generalized Anxiety Score  Nervous, Anxious, on Edge 0 1 1 1   Control/stop worrying 0 0 0 0  Worry too much - different things 0 0 0 0  Trouble relaxing 0 0 1 1  Restless 0 0 0 0  Easily annoyed or irritable 0 0 0 0  Afraid - awful might happen 0 0 0 0  Total GAD 7 Score 0 1 2 2   Anxiety Difficulty Not difficult at all Not difficult at all Not difficult at all Not difficult at all       07/17/2023    2:47 PM 03/06/2023    2:53 PM 05/16/2018    8:48 AM  Depression screen PHQ 2/9  Decreased Interest 0 1 0  Down, Depressed, Hopeless 0 0 0  PHQ - 2 Score 0 1 0  Altered sleeping  2 0  Tired, decreased energy  2 0  Change in appetite  1 0  Feeling bad or failure about yourself   0 0  Trouble concentrating  0 0  Moving slowly or fidgety/restless  0 0  Suicidal thoughts  0 0  PHQ-9 Score  6 0  Difficult doing work/chores  Not difficult at all Not difficult at all    BP Readings from Last 3 Encounters:  07/17/23 135/79  06/05/23 121/78  05/08/23 107/74    Physical Exam Constitutional:      Appearance: Normal appearance.  Pulmonary:     Effort: Pulmonary effort is normal.  Neurological:     Mental Status: She is alert.  Psychiatric:        Attention and Perception: Attention normal.        Mood and Affect: Mood normal.        Speech: Speech normal.         Behavior: Behavior normal.  Cognition and Memory: Cognition normal.     Wt Readings from Last 3 Encounters:  07/17/23 213 lb (96.6 kg)  06/05/23 212 lb (96.2 kg)  05/08/23 212 lb (96.2 kg)    There were no vitals taken for this visit.  Assessment and Plan:  Problem List Items Addressed This Visit   None Visit Diagnoses       Motion sickness, initial encounter    -  Primary   Relevant Medications   scopolamine  (TRANSDERM SCOP , 1.5 MG,) 1 MG/3DAYS      I spent 4 minutes on this encounter, 100% by video. No follow-ups on file.    Leita HILARIO Adie, MD [facility] [place]

## 2023-07-17 NOTE — Progress Notes (Signed)
 Leah Brown, D.O.  ABFM, ABOM Specializing in Clinical Bariatric Medicine  Office located at: [address] [place], [place]  [address]   Assessment and Plan:   FOR THE DISEASE OF OBESITY: Obesity, Beginning BMI 02/13/23 40.04 BMI 39.0-39.9,adult- current BMI 37.74 Assessment & Plan: Since last office visit on 06/05/23 patient's muscle mass has decreased by 2lb. Fat mass has increased by 3lb. Total body water has increased by 0.6lb.  Counseling done on how various foods will affect these numbers and how to maximize success  Total lbs lost to date: 15 lbs Total weight loss percentage to date: -6.58   Pt plans to go on cruise in 2 weeks, interested in strategies to help her to not gain weight.   Recommended Dietary Goals [name] is currently in the action stage of change. As such, her goal is to continue weight management plan.  She has agreed to: continue current plan   Behavioral Intervention We discussed the following today: increasing lean protein intake to established goals, decreasing simple carbohydrates , increasing vegetables, increasing lower glycemic fruits, decreasing eating out or consumption of processed foods, and making healthy choices when eating convenient foods, avoiding temptations and identifying enticing environmental cues, planning for success, staying on track while traveling and vacationing, and continue to work on maintaining a reduced calorie state, getting the recommended amount of protein, incorporating whole foods, making healthy choices, staying well hydrated and practicing mindfulness when eating.  Additional resources provided today: None  Evidence-based interventions for health behavior change were utilized today including the discussion of self monitoring techniques, problem-solving barriers and SMART goal setting techniques.   Regarding patient's less desirable eating habits and patterns, we employed the technique of small changes.   Pt  will specifically work on: Set alarm to take her medications on time and stay on plan while on her cruise for next visit.    Recommended Physical Activity Goals [name] has been advised to work up to 150 minutes of moderate intensity aerobic activity a week and strengthening exercises 2-3 times per week for cardiovascular health, weight loss maintenance and preservation of muscle mass.   She has agreed to :  Increase physical activity in their day and reduce sedentary time (increase NEAT)., Increase the intensity, frequency or duration of aerobic exercises  , and walking while on her cruise (opt for stairs when able).   Pharmacotherapy We discussed various medication options to help [name] with her weight loss efforts and we both agreed to : continue with nutritional and behavioral strategies, continue current anti-obesity medication regimen, and increase Wellbutrin  from 150 mg once daily to 150 BID.   FOR ASSOCIATED CONDITIONS ADDRESSED TODAY: Mood disorder [facility])- Emotional eating Assessment & Plan: Moods are stable. Pt established with Dr. Sharron. Pt taking Wellbutrin  Sr 150 mg once daily and Celexa  10 mg but reports multiple missed doses over the holidays. Endorses better controlled hunger/cravings when taking Wellbutrin  and decreased effectiveness of Celexa . Pt didn't make med changes recommended at last visit and now wishes to be on only one med by the time she goes on her cruise in 2 weeks.   After further discussion, we mutually agreed to gradually stop her Celexa  and to increase her Wellbutrin  to 150 mg BID. Reviewed the importance of remembering to take both doses daily, pt verbalized understanding.   Orders: -Increase Wellbutrin  SR from 150 mg once daily to twice daily    Prediabetes Assessment & Plan: Lab Results  Component Value Date  HGBA1C 5.7 (H) 02/13/2023   HGBA1C 5.2 03/05/2018   HGBA1C 5.2 10/31/2017   INSULIN  19.7 02/13/2023   INSULIN  11.9 03/05/2018   INSULIN  20.2  10/31/2017   Pt on Metformin  500 mg at breakfast and 1000 mg at lunch daily. Tolerating well, no side effects. Of note, pt reports multiple missed doses over the holidays. A1C above goal at 5.7. She will be going on a cruise in 2 weeks and requests mindful eating strategies to help her stay on plan.   Encouraged pt to set an alarm on her phone to remember doses. Extensively reviewed possible food options while on the cruise - lean proteins, fruits, vegetables. I recommend walking as much as possible while on the cruise and opt to take the stairs when possible. Avoid temptations and avoid eating whole deserts, take only a few bites.    Vitamin D  deficiency Assessment & Plan: Lab Results  Component Value Date   VD25OH 24.1 (L) 02/13/2023   VD25OH 39.0 03/05/2018   VD25OH 28.3 (L) 10/31/2017   Pt on ERGO 50K units once weekly. Tolerating well, no side effects. Pt reports multiple missed doses over the holidays. Continue with current supplementation regimen. We will continue to monitor her condition as it relates to her weight loss journey.   Orders: -Refill ERGO today, no dose changes.    Follow up:   Return in about 3 weeks (around 08/07/2023). She was informed of the importance of frequent follow up visits to maximize her success with intensive lifestyle modifications for her multiple health conditions.  Subjective:   Chief complaint: Obesity Liddy is here to discuss her progress with her obesity treatment plan. She is on the Category 2 Plan with 100 snack calories and states she is following her eating plan approximately 0% of the time. She states she is walking 15 minutes 2-3 days per week.  Interval History:  [name] is here for a follow up office visit. Since last OV, she is up 1 lb. Over holidays she struggled with remembering to take her medications on time and eat on plan when not in her routine of going to work. Pt planning to go on cruise in 2 weeks and would like  strategies to stay on plan.   Barriers identified: Out of her routine while not working over the holidays   Pharmacotherapy for weight loss: She is currently taking Metformin  (off label use for incretin effect and / or insulin  resistance and / or diabetes prevention) with adequate clinical response  and without side effects. and Bupropion  (single agent, off label use) with adequate clinical response  and without side effects.. Of note, pt has not been 100% compliant with medications while out of her work routine over the holidays.   Review of Systems:  Pertinent positives were addressed with patient today.  Reviewed by clinician on day of visit: allergies, medications, problem list, medical history, surgical history, family history, social history, and previous encounter notes.  Weight Summary and Biometrics   Weight Lost Since Last Visit: 0lb  Weight Gained Since Last Visit: 1lb  Vitals Temp: 98.4 F (36.9 C) BP: 135/79 Pulse Rate: 83 SpO2: 97 %   Anthropometric Measurements Height: 5' 3 (1.6 m) Weight: 213 lb (96.6 kg) BMI (Calculated): 37.74 Weight at Last Visit: 212lb Weight Lost Since Last Visit: 0lb Weight Gained Since Last Visit: 1lb Starting Weight: 228lb Total Weight Loss (lbs): 15 lb (6.804 kg) Peak Weight: 235lb   Body Composition  Body Fat %: 44.1 %  Fat Mass (lbs): 94 lbs Muscle Mass (lbs): 113 lbs Total Body Water (lbs): 83.2 lbs Visceral Fat Rating : 11   Other Clinical Data Fasting: no Labs: no Today's Visit #: 9 Starting Date: 02/13/23    Objective:   PHYSICAL EXAM: Blood pressure 135/79, pulse 83, temperature 98.4 F (36.9 C), height 5' 3 (1.6 m), weight 213 lb (96.6 kg), SpO2 97%. Body mass index is 37.73 kg/m.  General: she is overweight, cooperative and in no acute distress. PSYCH: Has normal mood, affect and thought process.   HEENT: EOMI, sclerae are anicteric. Lungs: Normal breathing effort, no conversational  dyspnea. Extremities: Moves * 4 Neurologic: A and O * 3, good insight  DIAGNOSTIC DATA REVIEWED: BMET    Component Value Date/Time   NA 139 02/13/2023 0859   NA 138 06/11/2013 1014   K 4.5 02/13/2023 0859   K 3.8 06/11/2013 1014   CL 102 02/13/2023 0859   CL 102 06/11/2013 1014   CO2 22 02/13/2023 0859   CO2 27 06/11/2013 1014   GLUCOSE 100 (H) 02/13/2023 0859   GLUCOSE 96 06/25/2017 1205   GLUCOSE 96 06/11/2013 1014   BUN 8 02/13/2023 0859   BUN 11 06/11/2013 1014   CREATININE 0.79 02/13/2023 0859   CREATININE 0.72 06/25/2017 1205   CALCIUM 9.3 02/13/2023 0859   CALCIUM 8.7 06/11/2013 1014   GFRNONAA 97 03/05/2018 0835   GFRNONAA 108 06/25/2017 1205   GFRAA 111 03/05/2018 0835   GFRAA 126 06/25/2017 1205   Lab Results  Component Value Date   HGBA1C 5.7 (H) 02/13/2023   HGBA1C 5.2 10/31/2017   Lab Results  Component Value Date   INSULIN  19.7 02/13/2023   INSULIN  20.2 10/31/2017   Lab Results  Component Value Date   TSH 1.630 02/13/2023   CBC    Component Value Date/Time   WBC 7.1 02/13/2023 0859   WBC 8.4 06/25/2017 1205   RBC 4.61 02/13/2023 0859   RBC 4.51 06/25/2017 1205   HGB 14.0 02/13/2023 0859   HCT 42.7 02/13/2023 0859   PLT 289 02/13/2023 0859   MCV 93 02/13/2023 0859   MCV 91 06/11/2013 1014   MCH 30.4 02/13/2023 0859   MCH 30.4 06/25/2017 1205   MCHC 32.8 02/13/2023 0859   MCHC 33.7 06/25/2017 1205   RDW 13.2 02/13/2023 0859   RDW 12.5 06/11/2013 1014   Iron Studies No results found for: IRON, TIBC, FERRITIN, IRONPCTSAT Lipid Panel     Component Value Date/Time   CHOL 201 (H) 02/13/2023 0859   CHOL 175 06/11/2013 1014   TRIG 109 02/13/2023 0859   TRIG 52 06/11/2013 1014   HDL 55 02/13/2023 0859   HDL 42 06/11/2013 1014   CHOLHDL 3.5 12/13/2016 0902   VLDL 13 12/13/2016 0902   VLDL 10 06/11/2013 1014   LDLCALC 127 (H) 02/13/2023 0859   LDLCALC 123 (H) 06/11/2013 1014   Hepatic Function Panel     Component Value  Date/Time   PROT 7.0 02/13/2023 0859   PROT 7.3 06/11/2013 1014   ALBUMIN 4.3 02/13/2023 0859   ALBUMIN 3.9 06/11/2013 1014   AST 17 02/13/2023 0859   AST 13 (L) 06/11/2013 1014   ALT 20 02/13/2023 0859   ALT 21 06/11/2013 1014   ALKPHOS 59 02/13/2023 0859   ALKPHOS 52 06/11/2013 1014   BILITOT 0.3 02/13/2023 0859   BILITOT 0.4 06/11/2013 1014      Component Value Date/Time   TSH 1.630 02/13/2023 0859   Nutritional Lab  Results  Component Value Date   VD25OH 24.1 (L) 02/13/2023   VD25OH 39.0 03/05/2018   VD25OH 28.3 (L) 10/31/2017    Attestations:   LILLETTE Vernell Forest, acting as a medical scribe for Leah Jenkins, DO., have compiled all relevant documentation for today's office visit on behalf of Leah Jenkins, DO, while in the presence of [employer], DO.  Reviewed by clinician on day of visit: allergies, medications, problem list, medical history, surgical history, family history, social history, and previous encounter notes pertinent to patient's obesity diagnosis.  I have reviewed the above documentation for accuracy and completeness, and I agree with the above. Leah JINNY Brown, D.O.  The 21st Century Cures Act was signed into law in 2016 which includes the topic of electronic health records.  This provides immediate access to information in MyChart.  This includes consultation notes, operative notes, office notes, lab results and pathology reports.  If you have any questions about what you read please let us  know at your next visit so we can discuss your concerns and take corrective action if need be.  We are right here with you.

## 2023-08-07 ENCOUNTER — Encounter (INDEPENDENT_AMBULATORY_CARE_PROVIDER_SITE_OTHER): Payer: Self-pay | Admitting: Family Medicine

## 2023-08-07 ENCOUNTER — Ambulatory Visit (INDEPENDENT_AMBULATORY_CARE_PROVIDER_SITE_OTHER): Payer: 59 | Admitting: Family Medicine

## 2023-08-07 VITALS — BP 117/60 | HR 81 | Temp 98.8°F | Ht 63.0 in | Wt 218.0 lb

## 2023-08-07 DIAGNOSIS — Z6837 Body mass index (BMI) 37.0-37.9, adult: Secondary | ICD-10-CM

## 2023-08-07 DIAGNOSIS — R7303 Prediabetes: Secondary | ICD-10-CM | POA: Diagnosis not present

## 2023-08-07 DIAGNOSIS — E559 Vitamin D deficiency, unspecified: Secondary | ICD-10-CM

## 2023-08-07 DIAGNOSIS — E669 Obesity, unspecified: Secondary | ICD-10-CM | POA: Diagnosis not present

## 2023-08-07 DIAGNOSIS — F5089 Other specified eating disorder: Secondary | ICD-10-CM

## 2023-08-07 DIAGNOSIS — F39 Unspecified mood [affective] disorder: Secondary | ICD-10-CM

## 2023-08-07 DIAGNOSIS — Z6839 Body mass index (BMI) 39.0-39.9, adult: Secondary | ICD-10-CM

## 2023-08-07 MED ORDER — BUPROPION HCL ER (XL) 150 MG PO TB24: 150.0000 mg | ORAL_TABLET | ORAL | 0 refills | Status: DC

## 2023-08-07 NOTE — Progress Notes (Signed)
Leah Brown, D.O.  ABFM, ABOM Specializing in Clinical Bariatric Medicine  Office located at: [address] [place], [place]  [address]   Assessment and Plan:   FOR THE DISEASE OF OBESITY: BMI 39.0-39.9,adult- current BMI 37.74 Obesity, Beginning BMI 02/13/23 40.04 Assessment & Plan: Since last office visit on 07/17/2023 patient's muscle mass has increased by 1.6lb. Fat mass has increased by 3.6lb. Total body water has increased by 2.4lb.  Counseling done on how various foods will affect these numbers and how to maximize success  Total lbs lost to date: 10 lbs  Total weight loss percentage to date: -4.39%   Recommended Dietary Goals Leah Brown is currently in the action stage of change. As such, her goal is to continue weight management plan.  She has agreed to: continue current plan on Category 2 meal plan and add journaling if pt desires -- to improve mindfulness.    Behavioral Intervention We discussed the following today: increasing lean protein intake to established goals, decreasing simple carbohydrates , avoiding skipping meals, increasing water intake , work on tracking and journaling calories using tracking application, keeping healthy foods at home, decreasing eating out or consumption of processed foods, and making healthy choices when eating convenient foods, practice mindfulness eating and understand the difference between hunger signals and cravings, work on managing stress, creating time for self-care and relaxation, and continue to work on maintaining a reduced calorie state, getting the recommended amount of protein, incorporating whole foods, making healthy choices, staying well hydrated and practicing mindfulness when eating.  Additional resources provided today: Handout on healthy eating and balanced plate and Handout on practicing mindfulness around eating  Evidence-based interventions for health behavior change were utilized today including the discussion of  self monitoring techniques, problem-solving barriers and SMART goal setting techniques.   Regarding patient's less desirable eating habits and patterns, we employed the technique of small changes.   Pt will specifically work on: Walk or do some type of cardio for at least  15 minutes every day for next visit.    Recommended Physical Activity Goals Leah Brown has been advised to work up to 150 minutes of moderate intensity aerobic activity a week and strengthening exercises 2-3 times per week for cardiovascular health, weight loss maintenance and preservation of muscle mass.   She has agreed to :  Think about enjoyable ways to increase daily physical activity and overcoming barriers to exercise, Increase physical activity in their day and reduce sedentary time (increase NEAT)., and walking/cardio for 15 minutes daily   Pharmacotherapy We discussed various medication options to help Leah Brown with her weight loss efforts and we both agreed to : continue with nutritional and behavioral strategies and Continue Metformin. Switch from Wellbutrin SR 150 mg  BID to Wellbutrin XL 150 mg once daily.   FOR ASSOCIATED CONDITIONS ADDRESSED TODAY: Mood disorder (HCC)- Emotional eating Assessment & Plan: Moods stable today. Denies any SI/HI. At her last OV on 1/8 she was advised to gradually stop Celexa and increase her Wellbutrin SR to 150 mg BID. Pt reports increased irritability when gradually stopping Celexa and desires to stay on 10 mg Celexa. She also reports increased nausea when taking her second dose of Wellbutrin SR, no nausea with morning dose. Wellbutrin has otherwise helped with her emotional eating and improved moods. Pt still does not feel satisfied and still has hunger cues despite eating on plan. She has been journaling on Lose It! app.   Continue journaling in efforts to increase mindfulness.Discussed  option of changing Wellbutrin from SR to XL, and the difference between the two. After discussion,  pt agrees to switch from Wellbutrin SR 150 mg BID to Wellbutrin XL 150 once daily. Continue with Celexa 10 mg once daily. Advised pt to take Wellbutrin with breakfast. Also discussed option of adding Topiramate and educated on risks/benefits. In the future consider adding Topiramate in addition to other medications to help with hunger/cravings. Not adding Topiramate today to avoid making medication additions/changes at once given Wellbutrin is being changed today.    Orders: - Start Wellbtruin XL 150 once daily in the mornings with breakfast   Prediabetes Assessment & Plan: Lab Results  Component Value Date   HGBA1C 5.7 (H) 02/13/2023   HGBA1C 5.2 03/05/2018   HGBA1C 5.2 10/31/2017   INSULIN 19.7 02/13/2023   INSULIN 11.9 03/05/2018   INSULIN 20.2 10/31/2017    Pt is on Metformin 500 mg with breakfast and 1000 with dinner. Endorses some GI distress with Metformin when eating off plan while on her cruise.   Reviewed how increased carb intake can stimulate more insulin. Advised pt to prioritize her protein intake. Increase exercise by walking or with other aerobic exercise. Encouraged pt to continue to journal to improve her mindfulness. Will continue to monitor her condition as it relates to weight loss.    Vitamin D deficiency Assessment & Plan: Lab Results  Component Value Date   VD25OH 24.1 (L) 02/13/2023   VD25OH 39.0 03/05/2018   VD25OH 28.3 (L) 10/31/2017   Pt is on ERGO 50K units once weekly. Tolerating well, no SE. Has been taking more consistently than before, had multiple missed doses over the holidays. Now has an alarm set to help her remember.   Continue with current supplementation. Will continue to monitor condition. No changes were made today.    Follow up:   Return in about 2 weeks (around 08/21/2023). She was informed of the importance of frequent follow up visits to maximize her success with intensive lifestyle modifications for her multiple health  conditions.  Subjective:   Chief complaint: Obesity Leah Brown is here to discuss her progress with her obesity treatment plan. She is on the Category 2 Plan with 100 snack calories and states she is following her eating plan approximately 0% of the time. She states she is not exercising. Took the stairs while on her cruise. Her room was on the 14th floor and the dining room was on the 7th floor.   Interval History:  Leah Brown is here for a follow up office visit. Since last OV, she is up 5 lbs. Pt recently returned from a cruise on Monday and reports being unable to eat much due to seasickness lasting many days. States her sx improved after trying acupuncture on the cruise and was able to eat more afterwards. She has been prioritizing her proteins at mealtimes. Tried suggestions on mindfulness handout pt was given at her last OV. Tried eating with her left hand to eat slower. Increased her water intake to help with her nausea. Endorses some hunger/cravings, but is hopeful this will improve now that she is able to get back on track with eating on plan.   Barriers identified: having difficulty focusing on healthy eating, low volume of physical activity at present , and being seasick while on her cruise .   Pharmacotherapy for weight loss: She is currently taking Metformin (off label use for incretin effect and / or insulin resistance and / or diabetes prevention) with adequate  clinical response  and without side effects. and Bupropion (single agent, off label use) with adequate clinical response  and experiencing the following side effects: nausea with 2nd dose in the evenings.  Review of Systems:  Pertinent positives were addressed with patient today.  Reviewed by clinician on day of visit: allergies, medications, problem list, medical history, surgical history, family history, social history, and previous encounter notes.  Weight Summary and Biometrics   Weight Lost Since Last Visit: 5  lb  Weight Gained Since Last Visit: 0 lb   Vitals Temp: 98.8 F (37.1 C) BP: 117/60 Pulse Rate: 81 SpO2: 97 %   Anthropometric Measurements Height: 5\' 3"  (1.6 m) Weight: 218 lb (98.9 kg) BMI (Calculated): 38.63 Weight at Last Visit: 213 lb Weight Lost Since Last Visit: 5 lb Weight Gained Since Last Visit: 0 lb Starting Weight: 228 lb Total Weight Loss (lbs): 10 lb (4.536 kg) Peak Weight: 235 lb   Body Composition  Body Fat %: 44.7 % Fat Mass (lbs): 97.6 lbs Muscle Mass (lbs): 114.6 lbs Total Body Water (lbs): 85.6 lbs Visceral Fat Rating : 12   Other Clinical Data Fasting: no Labs: no Today's Visit #: 10 Starting Date: 02/13/23    Objective:   PHYSICAL EXAM: Blood pressure 117/60, pulse 81, temperature 98.8 F (37.1 C), height 5\' 3"  (1.6 m), weight 218 lb (98.9 kg), last menstrual period 08/07/2023, SpO2 97%. Body mass index is 38.62 kg/m.  General: she is overweight, cooperative and in no acute distress. PSYCH: Has normal mood, affect and thought process.   HEENT: EOMI, sclerae are anicteric. Lungs: Normal breathing effort, no conversational dyspnea. Extremities: Moves * 4 Neurologic: A and O * 3, good insight  DIAGNOSTIC DATA REVIEWED: BMET    Component Value Date/Time   NA 139 02/13/2023 0859   NA 138 06/11/2013 1014   K 4.5 02/13/2023 0859   K 3.8 06/11/2013 1014   CL 102 02/13/2023 0859   CL 102 06/11/2013 1014   CO2 22 02/13/2023 0859   CO2 27 06/11/2013 1014   GLUCOSE 100 (H) 02/13/2023 0859   GLUCOSE 96 06/25/2017 1205   GLUCOSE 96 06/11/2013 1014   BUN 8 02/13/2023 0859   BUN 11 06/11/2013 1014   CREATININE 0.79 02/13/2023 0859   CREATININE 0.72 06/25/2017 1205   CALCIUM 9.3 02/13/2023 0859   CALCIUM 8.7 06/11/2013 1014   GFRNONAA 97 03/05/2018 0835   GFRNONAA 108 06/25/2017 1205   GFRAA 111 03/05/2018 0835   GFRAA 126 06/25/2017 1205   Lab Results  Component Value Date   HGBA1C 5.7 (H) 02/13/2023   HGBA1C 5.2 10/31/2017    Lab Results  Component Value Date   INSULIN 19.7 02/13/2023   INSULIN 20.2 10/31/2017   Lab Results  Component Value Date   TSH 1.630 02/13/2023   CBC    Component Value Date/Time   WBC 7.1 02/13/2023 0859   WBC 8.4 06/25/2017 1205   RBC 4.61 02/13/2023 0859   RBC 4.51 06/25/2017 1205   HGB 14.0 02/13/2023 0859   HCT 42.7 02/13/2023 0859   PLT 289 02/13/2023 0859   MCV 93 02/13/2023 0859   MCV 91 06/11/2013 1014   MCH 30.4 02/13/2023 0859   MCH 30.4 06/25/2017 1205   MCHC 32.8 02/13/2023 0859   MCHC 33.7 06/25/2017 1205   RDW 13.2 02/13/2023 0859   RDW 12.5 06/11/2013 1014   Iron Studies No results found for: "IRON", "TIBC", "FERRITIN", "IRONPCTSAT" Lipid Panel     Component Value  Date/Time   CHOL 201 (H) 02/13/2023 0859   CHOL 175 06/11/2013 1014   TRIG 109 02/13/2023 0859   TRIG 52 06/11/2013 1014   HDL 55 02/13/2023 0859   HDL 42 06/11/2013 1014   CHOLHDL 3.5 12/13/2016 0902   VLDL 13 12/13/2016 0902   VLDL 10 06/11/2013 1014   LDLCALC 127 (H) 02/13/2023 0859   LDLCALC 123 (H) 06/11/2013 1014   Hepatic Function Panel     Component Value Date/Time   PROT 7.0 02/13/2023 0859   PROT 7.3 06/11/2013 1014   ALBUMIN 4.3 02/13/2023 0859   ALBUMIN 3.9 06/11/2013 1014   AST 17 02/13/2023 0859   AST 13 (L) 06/11/2013 1014   ALT 20 02/13/2023 0859   ALT 21 06/11/2013 1014   ALKPHOS 59 02/13/2023 0859   ALKPHOS 52 06/11/2013 1014   BILITOT 0.3 02/13/2023 0859   BILITOT 0.4 06/11/2013 1014      Component Value Date/Time   TSH 1.630 02/13/2023 0859   Nutritional Lab Results  Component Value Date   VD25OH 24.1 (L) 02/13/2023   VD25OH 39.0 03/05/2018   VD25OH 28.3 (L) 10/31/2017    Attestations:   Burnett Sheng, acting as a medical scribe for Thomasene Lot, DO., have compiled all relevant documentation for today's office visit on behalf of Thomasene Lot, DO, while in the presence of [employer], DO.  Reviewed by clinician on day of visit:  allergies, medications, problem list, medical history, surgical history, family history, social history, and previous encounter notes pertinent to patient's obesity diagnosis.  I have reviewed the above documentation for accuracy and completeness, and I agree with the above. Leah Brown, D.O.  The 21st Century Cures Act was signed into law in 2016 which includes the topic of electronic health records.  This provides immediate access to information in MyChart.  This includes consultation notes, operative notes, office notes, lab results and pathology reports.  If you have any questions about what you read please let us know at your next visit so we can discuss your concerns and take corrective action if need be.  We are right here with you.

## 2023-08-08 ENCOUNTER — Ambulatory Visit: Payer: Self-pay | Admitting: *Deleted

## 2023-08-08 NOTE — Telephone Encounter (Signed)
   Disposition: [] ED /[] Urgent Care (no appt availability in office) / [] Appointment(In office/virtual)/ []  [facility]/ [] Home Care/ [] Refused Recommended Disposition /[] [facility]/ [x]  Follow-up with PCP Additional Notes:  Verified message: Patient advised to stop : Norgestimate-Ethinyl Estradiol Triphasic (TRI-LO-MARZIA) 0.18/0.215/0.25 MG-25 MCG tab  before endocrine appointment /testing. Patient has been advised to use back up method and restart medication after visit.

## 2023-08-08 NOTE — Telephone Encounter (Signed)
              Verified message: Patient advised to stop : Norgestimate-Ethinyl Estradiol Triphasic (TRI-LO-MARZIA) 0.18/0.215/0.25 MG-25 MCG tab  before endocrine appointment /testing. Patient has been advised to use back up method and restart medication after visit.  Reason for Disposition  [1] Follow-up call to recent contact AND [2] information only call, no triage required  Answer Assessment - Initial Assessment Questions 1. REASON FOR CALL or QUESTION: "What is your reason for calling today?" or "How can I best help you?" or "What question do you have that I can help answer?"      Verified message: Patient advised to stop : Norgestimate-Ethinyl Estradiol Triphasic (TRI-LO-MARZIA) 0.18/0.215/0.25 MG-25 MCG tab  before endocrine appointment /testing. Patient has been advised to use back up method and restart medication after visit.  Protocols used: Information Only Call - No Triage-A-AH   Summary: med inof   Dr Lars Mage, called in states told pt to stop taking med  [place], temporarily , until her appt with hi on Feb 7

## 2023-08-12 ENCOUNTER — Telehealth (INDEPENDENT_AMBULATORY_CARE_PROVIDER_SITE_OTHER): Payer: Self-pay | Admitting: Psychology

## 2023-08-12 DIAGNOSIS — F5089 Other specified eating disorder: Secondary | ICD-10-CM | POA: Diagnosis not present

## 2023-08-12 NOTE — Progress Notes (Signed)
  Office: [phone]  /  Fax: [phone]    Date: August 12, 2023  Appointment Start Time: 11:00am Duration: 28 minutes Provider: Lawerance Cruel, Psy.D. Type of Session: Individual Therapy  Location of Patient: Work (private location) Location of Provider: Provider's Home (private office) Type of Contact: Telepsychological Visit via MyChart Video Visit  Session Content: Leah Brown is a 43 y.o. female presenting for a follow-up appointment to address the previously established treatment goal of increasing coping skills.Today's appointment was a telepsychological visit. Leah Brown provided verbal consent for today's telepsychological appointment and she is aware she is responsible for securing confidentiality on her end of the session. Prior to proceeding with today's appointment, Leah Brown's physical location at the time of this appointment was obtained as well a phone number she could be reached at in the event of technical difficulties. Leah Brown and this provider participated in today's telepsychological service.   This provider conducted a brief check-in. Leah Brown shared about her recent cruise, noting she gained weight. Discussed what went well (e.g., eating with non-dominant hand to slow down eating) as it relates to eating habits and what she could differently during future vacations (e.g., focusing on consuming protein first when starting a meal). Psychoeducation provided regarding habit stacking. Explored current habits and habits she wants to implement as it relates to her eating, specifically protein intake. Focused on making "stacks" to help her meet her goals with the clinic. Overall, Leah Brown was receptive to today's appointment as evidenced by openness to sharing, responsiveness to feedback, and willingness to implement discussed strategies .  Mental Status Examination:  Appearance: neat Behavior: appropriate to circumstances Mood: neutral Affect: mood congruent Speech: WNL Eye Contact:  appropriate Psychomotor Activity: WNL Gait: unable to assess Thought Process: linear, logical, and goal directed and no evidence or endorsement of suicidal, homicidal, and self-harm ideation, plan and intent  Thought Content/Perception: no hallucinations, delusions, bizarre thinking or behavior endorsed or observed Orientation: AAOx4 Memory/Concentration: intact Insight: good Judgment: good  Interventions:  Conducted a brief chart review Provided empathic reflections and validation Provided positive reinforcement Employed supportive psychotherapy interventions to facilitate reduced distress and to improve coping skills with identified stressors Engaged patient in problem solving Psychoeducation provided regarding habit stacking   DSM-5 Diagnosis(es):  F50.89 Other Specified Feeding or Eating Disorder, Emotional and Binge Eating Behaviors  Treatment Goal & Progress: When re-initiating services with this provider, the following treatment goal was established: increase coping skills. Leah Brown has demonstrated progress in her goal as evidenced by increased awareness of hunger patterns and triggers for emotional eating behaviors. Leah Brown also continues to demonstrate willingness to engage in learned skill(s).   Plan: The next appointment is scheduled for 09/02/2023 at 11am, which will be via MyChart Video Visit. The next session will focus on working towards the established treatment goal and reviewing habit stacks.   Lawerance Cruel, PsyD

## 2023-08-26 ENCOUNTER — Ambulatory Visit (INDEPENDENT_AMBULATORY_CARE_PROVIDER_SITE_OTHER): Payer: 59 | Admitting: Family Medicine

## 2023-08-26 ENCOUNTER — Encounter (INDEPENDENT_AMBULATORY_CARE_PROVIDER_SITE_OTHER): Payer: Self-pay | Admitting: Family Medicine

## 2023-08-26 VITALS — BP 112/71 | HR 76 | Temp 98.5°F | Ht 63.0 in | Wt 220.0 lb

## 2023-08-26 DIAGNOSIS — F39 Unspecified mood [affective] disorder: Secondary | ICD-10-CM | POA: Diagnosis not present

## 2023-08-26 DIAGNOSIS — Z6839 Body mass index (BMI) 39.0-39.9, adult: Secondary | ICD-10-CM

## 2023-08-26 DIAGNOSIS — Z6837 Body mass index (BMI) 37.0-37.9, adult: Secondary | ICD-10-CM

## 2023-08-26 DIAGNOSIS — E559 Vitamin D deficiency, unspecified: Secondary | ICD-10-CM | POA: Diagnosis not present

## 2023-08-26 DIAGNOSIS — R7303 Prediabetes: Secondary | ICD-10-CM | POA: Diagnosis not present

## 2023-08-26 DIAGNOSIS — F5089 Other specified eating disorder: Secondary | ICD-10-CM | POA: Diagnosis not present

## 2023-08-26 DIAGNOSIS — E669 Obesity, unspecified: Secondary | ICD-10-CM

## 2023-08-26 MED ORDER — METFORMIN HCL 500 MG PO TABS: ORAL_TABLET | ORAL | 0 refills | Status: DC

## 2023-08-26 MED ORDER — BUPROPION HCL ER (SR) 150 MG PO TB12: 150.0000 mg | ORAL_TABLET | Freq: Two times a day (BID) | ORAL | 0 refills | Status: DC

## 2023-08-26 MED ORDER — VITAMIN D (ERGOCALCIFEROL) 1.25 MG (50000 UNIT) PO CAPS: 50000.0000 [IU] | ORAL_CAPSULE | ORAL | 0 refills | Status: DC

## 2023-08-26 NOTE — Progress Notes (Signed)
 Leah Brown, D.O.  ABFM, ABOM Specializing in Clinical Bariatric Medicine  Office located at: [address] [place], [place]  [address]   Assessment and Plan:   FOR THE DISEASE OF OBESITY: BMI 39.0-39.9,adult- current BMI 37.74 Assessment & Plan: Since last office visit on 08/07/2023, patient's muscle mass has decreased by 1.4 lbs. Fat mass has increased by 4 lbs. Total body water has increased by 2.2 lbs.  Counseling done on how various foods will affect these numbers and how to maximize success  Total lbs lost to date: 8 lbs Total weight loss percentage to date: -3.51%    Recommended Dietary Goals Leah Brown is currently in the action stage of change. As such, her goal is to continue weight management plan.  She has agreed to: Continue following CAT 2 meal plan and start journaling with Cat 2 as guide.    Behavioral Intervention We discussed the following today: additional protein options, Journaling food intake using CAT 2 MP as a guide  We discussed the following today: increasing lean protein intake to established goals, decreasing simple carbohydrates , work on tracking and journaling calories using tracking application, decreasing eating out or consumption of processed foods, and making healthy choices when eating convenient foods, and practice mindfulness eating and understand the difference between hunger signals and cravings  Additional resources provided today: Handout on Healthy sources of protein, Handout on non-starchy vegetables, Handout on Food Journaling Plan, and Food Journaling log  Evidence-based interventions for health behavior change were utilized today including the discussion of self monitoring techniques, problem-solving barriers and SMART goal setting techniques.   Regarding patient's less desirable eating habits and patterns, we employed the technique of small changes.   Pt will specifically work on: Take all medications as prescribed, start  weight training 2-3 days per week, and increase daily movement with more purposeful physical activity daily for next visit.    Recommended Physical Activity Goals Leah Brown has been advised to work up to 150 minutes of moderate intensity aerobic activity a week and strengthening exercises 2-3 times per week for cardiovascular health, weight loss maintenance and preservation of muscle mass.   She has agreed to :  Continue current level of physical activity    Pharmacotherapy We both agreed to : continue with nutritional and behavioral strategies, discontinue Wellbutrin XL 150 mg weekly due to lack of therapeutic benefit, and Restart Wellbutrin SR 150 mg weekly .   FOR ASSOCIATED CONDITIONS ADDRESSED TODAY: Prediabetes Assessment & Plan: Lab Results  Component Value Date   HGBA1C 5.7 (H) 02/13/2023   HGBA1C 5.2 03/05/2018   HGBA1C 5.2 10/31/2017   INSULIN 19.7 02/13/2023   INSULIN 11.9 03/05/2018   INSULIN 20.2 10/31/2017    Metformin was increased to 500 mg with breakfast and 1,000 mg with lunch daily on 06/05/23. Pt taking differently: 500 mg BID with breakfast and lunch. She also states she has not been taking doses consistently. Additionally, pt has been struggling to eat off plan.   Stressed the importance of taking her medications as prescribed and eating on plan to avoid any GI upset. Encouraged pt to increase her exercise to weight training 2-3 days per week and adding purposefully activity daily. Will refill Metformin today.   Orders: - Refill Metformin, no dose changes   Mood disorder (HCC)- Emotional eating Assessment & Plan: Pt is currently on Wellbutrin XL 150 mg once daily. Last OV, medication was changed from Wellbutrin SR to Wellbutrin XL d/t experiencing nausea. Since starting  on XL dose her hunger/cravings have increased. Pt has been meeting with Dr. Dewaine Conger and feels it has been helpful. Pt desires to resume Wellbutrin SR 150 mg BID for better control of  hunger/cravings, stating she will be able to tolerate the nausea.   Mutually agreed with pt to restart Wellbutrin SR with breakfast and lunch given pt had an increase in hunger and cravings on Wellbutrin XL. I recommend she take her medication first thing in the morning with breakfast and with lunch for increased effectiveness. Continue to follow up with Dr. Dewaine Conger. Will continue to monitor condition alongside PCP/Dr. Dewaine Conger.   Orders: - Resume Wellbutrin SR 150 mg with breakfast and lunch.    Vitamin D deficiency Assessment & Plan: Lab Results  Component Value Date   VD25OH 24.1 (L) 02/13/2023   VD25OH 39.0 03/05/2018   VD25OH 28.3 (L) 10/31/2017   Pt is on ERGO 50K units once weekly. Tolerating well with no adverse SE reported.   Continue with current supplementation. Refill ERGO today, no changes made today. Will continue to monitor condition as it relates to her weight loss journey.   Orders: - Refill ERGO, no dose changes.   Follow up:   Return in about 23 days (around 09/18/2023). She was informed of the importance of frequent follow up visits to maximize her success with intensive lifestyle modifications for her multiple health conditions.  Subjective:   Chief complaint: Obesity Leah Brown is here to discuss her progress with her obesity treatment plan. She is on the Category 2 Plan and states she is following her eating plan approximately 10% of the time. She states she is walking 15 minutes 2-3 days per week and [employer] 4-5 days per week.  Interval History:  Leah Brown is here for a follow up office visit. She is accompanied by her husband Leah Brown). Since last OV on 1/29.25, she is up 2 lbs. She has struggled with staying on plan and reports she made many unhealthy choices. She adds she "lost sight of the goal" and lacked motivation at times. Previously she attempted to journal on the My[employer]itness[employer] but was not consistent with it. Tends to eat pizza on Fridays at work.     Pharmacotherapy for weight loss: She is currently taking Metformin (off label use for incretin effect and / or insulin resistance and / or diabetes prevention) with adequate clinical response  and without side effects. and Wellbutrin XL 150 mg daily .   Review of Systems:  Pertinent positives were addressed with patient today.  Reviewed by clinician on day of visit: allergies, medications, problem list, medical history, surgical history, family history, social history, and previous encounter notes.  Weight Summary and Biometrics   Weight Lost Since Last Visit: 0  Weight Gained Since Last Visit: 2lb    Vitals Temp: 98.5 F (36.9 C) BP: 112/71 Pulse Rate: 76 SpO2: 98 %   Anthropometric Measurements Height: 5\' 3"  (1.6 m) Weight: 220 lb (99.8 kg) BMI (Calculated): 38.98 Weight at Last Visit: 218lb Weight Lost Since Last Visit: 0 Weight Gained Since Last Visit: 2lb Starting Weight: 228lb Total Weight Loss (lbs): 8 lb (3.629 kg) Peak Weight: 235lb   Body Composition  Body Fat %: 46 % Fat Mass (lbs): 101.6 lbs Muscle Mass (lbs): 113.2 lbs Total Body Water (lbs): 87.8 lbs Visceral Fat Rating : 12   Other Clinical Data Fasting: no Labs: no Today's Visit #: 11 Starting Date: 02/13/23    Objective:   PHYSICAL EXAM: Blood pressure 112/71,  pulse 76, temperature 98.5 F (36.9 C), height 5\' 3"  (1.6 m), weight 220 lb (99.8 kg), last menstrual period 08/07/2023, SpO2 98%. Body mass index is 38.97 kg/m.  General: she is overweight, cooperative and in no acute distress. PSYCH: Has normal mood, affect and thought process.   HEENT: EOMI, sclerae are anicteric. Lungs: Normal breathing effort, no conversational dyspnea. Extremities: Moves * 4 Neurologic: A and O * 3, good insight  DIAGNOSTIC DATA REVIEWED: BMET    Component Value Date/Time   NA 139 02/13/2023 0859   NA 138 06/11/2013 1014   K 4.5 02/13/2023 0859   K 3.8 06/11/2013 1014   CL 102 02/13/2023 0859    CL 102 06/11/2013 1014   CO2 22 02/13/2023 0859   CO2 27 06/11/2013 1014   GLUCOSE 100 (H) 02/13/2023 0859   GLUCOSE 96 06/25/2017 1205   GLUCOSE 96 06/11/2013 1014   BUN 8 02/13/2023 0859   BUN 11 06/11/2013 1014   CREATININE 0.79 02/13/2023 0859   CREATININE 0.72 06/25/2017 1205   CALCIUM 9.3 02/13/2023 0859   CALCIUM 8.7 06/11/2013 1014   GFRNONAA 97 03/05/2018 0835   GFRNONAA 108 06/25/2017 1205   GFRAA 111 03/05/2018 0835   GFRAA 126 06/25/2017 1205   Lab Results  Component Value Date   HGBA1C 5.7 (H) 02/13/2023   HGBA1C 5.2 10/31/2017   Lab Results  Component Value Date   INSULIN 19.7 02/13/2023   INSULIN 20.2 10/31/2017   Lab Results  Component Value Date   TSH 1.630 02/13/2023   CBC    Component Value Date/Time   WBC 7.1 02/13/2023 0859   WBC 8.4 06/25/2017 1205   RBC 4.61 02/13/2023 0859   RBC 4.51 06/25/2017 1205   HGB 14.0 02/13/2023 0859   HCT 42.7 02/13/2023 0859   PLT 289 02/13/2023 0859   MCV 93 02/13/2023 0859   MCV 91 06/11/2013 1014   MCH 30.4 02/13/2023 0859   MCH 30.4 06/25/2017 1205   MCHC 32.8 02/13/2023 0859   MCHC 33.7 06/25/2017 1205   RDW 13.2 02/13/2023 0859   RDW 12.5 06/11/2013 1014   Iron Studies No results found for: "IRON", "TIBC", "FERRITIN", "IRONPCTSAT" Lipid Panel     Component Value Date/Time   CHOL 201 (H) 02/13/2023 0859   CHOL 175 06/11/2013 1014   TRIG 109 02/13/2023 0859   TRIG 52 06/11/2013 1014   HDL 55 02/13/2023 0859   HDL 42 06/11/2013 1014   CHOLHDL 3.5 12/13/2016 0902   VLDL 13 12/13/2016 0902   VLDL 10 06/11/2013 1014   LDLCALC 127 (H) 02/13/2023 0859   LDLCALC 123 (H) 06/11/2013 1014   Hepatic Function Panel     Component Value Date/Time   PROT 7.0 02/13/2023 0859   PROT 7.3 06/11/2013 1014   ALBUMIN 4.3 02/13/2023 0859   ALBUMIN 3.9 06/11/2013 1014   AST 17 02/13/2023 0859   AST 13 (L) 06/11/2013 1014   ALT 20 02/13/2023 0859   ALT 21 06/11/2013 1014   ALKPHOS 59 02/13/2023 0859    ALKPHOS 52 06/11/2013 1014   BILITOT 0.3 02/13/2023 0859   BILITOT 0.4 06/11/2013 1014      Component Value Date/Time   TSH 1.630 02/13/2023 0859   Nutritional Lab Results  Component Value Date   VD25OH 24.1 (L) 02/13/2023   VD25OH 39.0 03/05/2018   VD25OH 28.3 (L) 10/31/2017    Attestations:   Burnett Sheng, acting as a Stage manager for Thomasene Lot, DO., have compiled all relevant  documentation for today's office visit on behalf of Thomasene Lot, DO, while in the presence of Thomasene Lot, DO.  Reviewed by clinician on day of visit: allergies, medications, problem list, medical history, surgical history, family history, social history, and previous encounter notes pertinent to patient's obesity diagnosis.  I have reviewed the above documentation for accuracy and completeness, and I agree with the above. Leah Brown, D.O.  The 21st Century Cures Act was signed into law in 2016 which includes the topic of electronic health records.  This provides immediate access to information in MyChart.  This includes consultation notes, operative notes, office notes, lab results and pathology reports.  If you have any questions about what you read please let us know at your next visit so we can discuss your concerns and take corrective action if need be.  We are right here with you.

## 2023-09-02 ENCOUNTER — Telehealth (INDEPENDENT_AMBULATORY_CARE_PROVIDER_SITE_OTHER): Payer: 59 | Admitting: Psychology

## 2023-09-02 DIAGNOSIS — F5089 Other specified eating disorder: Secondary | ICD-10-CM | POA: Diagnosis not present

## 2023-09-02 NOTE — Progress Notes (Signed)
  Office: [phone]  /  Fax: [phone]    Date: September 02, 2023  Appointment Start Time: 11:02am Duration: 19 minutes Provider: Lawerance Cruel, Psy.D. Type of Session: Individual Therapy  Location of Patient: Home (private location) Location of Provider: Provider's Home (private office) Type of Contact: Telepsychological Visit via MyChart Video Visit  Session Content: Leah Brown is a 43 y.o. female presenting for a follow-up appointment to address the previously established treatment goal of increasing coping skills.Today's appointment was a telepsychological visit. Leah Brown provided verbal consent for today's telepsychological appointment and she is aware she is responsible for securing confidentiality on her end of the session. Prior to proceeding with today's appointment, Leah Brown's physical location at the time of this appointment was obtained as well a phone number she could be reached at in the event of technical difficulties. Leah Brown and this provider participated in today's telepsychological service.   This provider conducted a brief check-in. Leah Brown reported ongoing stress related to work, but noted she is enjoying exercising at the [facility]. She acknowledged engagement in emotional eating behaviors due to stressors. Reviewed the connection between thoughts, feelings, and behaviors. Psychoeducation regarding thought defusion, including its impact on emotional eating and overall well-being was provided. For the thought defusion exercises during today's appointment, Leah Brown utilized the following thought: "I am out of control." Her experience was processed after the exercises. Leah Brown provided verbal consent during today's appointment for this provider to send a handout with thought defusion exercises via e-mail. Overall, Leah Brown was receptive to today's appointment as evidenced by openness to sharing, responsiveness to feedback, and willingness to engage in thought defusion exercises to assist with  coping.  Mental Status Examination:  Appearance: neat Behavior: appropriate to circumstances Mood: sad Affect: mood congruent Speech: WNL Eye Contact: appropriate Psychomotor Activity: WNL Gait: unable to assess Thought Process: linear, logical, and goal directed and no evidence or endorsement of suicidal, homicidal, and self-harm ideation, plan and intent  Thought Content/Perception: no hallucinations, delusions, bizarre thinking or behavior endorsed or observed Orientation: AAOx4 Memory/Concentration: intact Insight: good Judgment: good  Interventions:  Conducted a brief chart review Provided empathic reflections and validation Reviewed content from the previous session Provided positive reinforcement Employed supportive psychotherapy interventions to facilitate reduced distress and to improve coping skills with identified stressors Psychoeducation provided regarding the connection between thoughts, feelings, and behaviors Psychoeducation provided regarding thought defusion Engaged patient in thought defusion exercise(s)  DSM-5 Diagnosis(es): F50.89 Other Specified Feeding or Eating Disorder, Emotional and Binge Eating Behaviors  Treatment Goal & Progress: When re-initiating services with this provider, the following treatment goal was established: increase coping skills. Leah Brown has demonstrated progress in her goal as evidenced by increased awareness of hunger patterns and triggers for emotional eating behaviors. Leah Brown also continues to demonstrate willingness to engage in learned skill(s) (e.g., thought defusion exercises).   Plan: The next appointment is scheduled for 09/23/2023 at 11am, which will be via MyChart Video Visit. The next session will focus on working towards the established treatment goal.   Lawerance Cruel, PsyD

## 2023-09-06 ENCOUNTER — Ambulatory Visit: Payer: Self-pay | Admitting: Internal Medicine

## 2023-09-17 ENCOUNTER — Other Ambulatory Visit (INDEPENDENT_AMBULATORY_CARE_PROVIDER_SITE_OTHER): Payer: Self-pay | Admitting: Family Medicine

## 2023-09-17 DIAGNOSIS — R7303 Prediabetes: Secondary | ICD-10-CM

## 2023-09-18 ENCOUNTER — Other Ambulatory Visit (INDEPENDENT_AMBULATORY_CARE_PROVIDER_SITE_OTHER): Payer: Self-pay | Admitting: Family Medicine

## 2023-09-18 ENCOUNTER — Ambulatory Visit (INDEPENDENT_AMBULATORY_CARE_PROVIDER_SITE_OTHER): Payer: 59 | Admitting: Family Medicine

## 2023-09-18 DIAGNOSIS — F39 Unspecified mood [affective] disorder: Secondary | ICD-10-CM

## 2023-09-19 ENCOUNTER — Ambulatory Visit (INDEPENDENT_AMBULATORY_CARE_PROVIDER_SITE_OTHER): Payer: 59 | Admitting: Physician Assistant

## 2023-09-20 ENCOUNTER — Encounter: Payer: Self-pay | Admitting: Internal Medicine

## 2023-09-20 ENCOUNTER — Ambulatory Visit: Admitting: Internal Medicine

## 2023-09-20 VITALS — BP 114/74 | HR 85 | Ht 63.0 in | Wt 224.2 lb

## 2023-09-20 DIAGNOSIS — M533 Sacrococcygeal disorders, not elsewhere classified: Secondary | ICD-10-CM

## 2023-09-20 MED ORDER — MELOXICAM 15 MG PO TABS: 15.0000 mg | ORAL_TABLET | Freq: Every day | ORAL | 0 refills | Status: DC

## 2023-09-20 MED ORDER — METHOCARBAMOL 500 MG PO TABS: 500.0000 mg | ORAL_TABLET | Freq: Every day | ORAL | 0 refills | Status: DC

## 2023-09-20 NOTE — Progress Notes (Signed)
 Date:  09/20/2023   Name:  Leah Brown   DOB:  1980/08/01   MRN:  [id number]   Chief Complaint: Back Pain (Patient said she is having lower back pain, started Monday, sharp pain when bending over )  Back Pain This is a new problem. Episode onset: 4 days ago. The problem occurs constantly. The problem is unchanged. The pain is present in the sacro-iliac. The quality of the pain is described as aching and shooting. The pain does not radiate. The pain is moderate. The symptoms are aggravated by bending and twisting. Pertinent negatives include no abdominal pain, bladder incontinence, bowel incontinence, chest pain, numbness, paresis, tingling or weakness. She has tried heat and NSAIDs for the symptoms. The treatment provided mild relief.    Review of Systems  Constitutional:  Negative for chills and fatigue.  Respiratory:  Negative for chest tightness and shortness of breath.   Cardiovascular:  Negative for chest pain.  Gastrointestinal:  Negative for abdominal pain and bowel incontinence.  Genitourinary:  Negative for bladder incontinence.  Musculoskeletal:  Positive for back pain.  Neurological:  Negative for tingling, weakness and numbness.     Lab Results  Component Value Date   NA 139 02/13/2023   K 4.5 02/13/2023   CO2 22 02/13/2023   GLUCOSE 100 (H) 02/13/2023   BUN 8 02/13/2023   CREATININE 0.79 02/13/2023   CALCIUM 9.3 02/13/2023   EGFR 96 02/13/2023   GFRNONAA 97 03/05/2018   Lab Results  Component Value Date   CHOL 201 (H) 02/13/2023   HDL 55 02/13/2023   LDLCALC 127 (H) 02/13/2023   TRIG 109 02/13/2023   CHOLHDL 3.5 12/13/2016   Lab Results  Component Value Date   TSH 1.630 02/13/2023   Lab Results  Component Value Date   HGBA1C 5.7 (H) 02/13/2023   Lab Results  Component Value Date   WBC 7.1 02/13/2023   HGB 14.0 02/13/2023   HCT 42.7 02/13/2023   MCV 93 02/13/2023   PLT 289 02/13/2023   Lab Results  Component Value Date   ALT 20  02/13/2023   AST 17 02/13/2023   ALKPHOS 59 02/13/2023   BILITOT 0.3 02/13/2023   Lab Results  Component Value Date   VD25OH 24.1 (L) 02/13/2023     Patient Active Problem List   Diagnosis Date Noted   BMI 39.0-39.9,adult 03/13/2023   BRCA gene mutation negative in female 03/06/2023   PCOS (polycystic ovarian syndrome) 03/06/2023   Gastroesophageal reflux disease 02/26/2022   Depression 11/18/2017   Vitamin D deficiency 12/13/2016   Other seasonal allergic rhinitis 12/03/2015   Morbid obesity (HCC) 12/03/2015   Family hx of ovarian malignancy 12/03/2015    Allergies  Allergen Reactions   Almond (Diagnostic) Hives and Swelling   Celery Oil Swelling   Codeine Nausea Only   Peanut (Diagnostic) Other (See Comments)    Allergy testing   Peanut-Containing Drug Products Other (See Comments)    Allergy testing    Pollen Extract Swelling   Tree Extract Hives and Swelling    Past Surgical History:  Procedure Laterality Date   CHOLECYSTECTOMY, LAPAROSCOPIC  02/24/2021   SKIN CANCER DESTRUCTION Left 2008   Breast   WISDOM TOOTH EXTRACTION      Social History   Tobacco Use   Smoking status: Former    Current packs/day: 0.00    Average packs/day: 0.5 packs/day for 4.0 years (2.0 ttl pk-yrs)    Types: Cigarettes    Start date:  07/10/1999    Quit date: 07/10/2003    Years since quitting: 20.2   Smokeless tobacco: Never  Vaping Use   Vaping status: Never Used  Substance Use Topics   Alcohol use: Yes    Alcohol/week: 2.0 standard drinks of alcohol    Types: 1 Glasses of wine, 1 Cans of beer per week    Comment: socially   Drug use: No     Medication list has been reviewed and updated.  Current Meds  Medication Sig   Bacillus Coagulans-Inulin (PROBIOTIC-PREBIOTIC PO) Take by mouth daily.   buPROPion (WELLBUTRIN SR) 150 MG 12 hr tablet Take 1 tablet (150 mg total) by mouth 2 (two) times daily.   caffeine 200 MG TABS tablet Take 100 mg by mouth daily as needed.    cetirizine (ZYRTEC) 10 MG tablet Take 10 mg by mouth as needed.    meloxicam (MOBIC) 15 MG tablet Take 1 tablet (15 mg total) by mouth daily.   metFORMIN (GLUCOPHAGE) 500 MG tablet 1 po breakfast and 2 po lunch daily   methocarbamol (ROBAXIN) 500 MG tablet Take 1 tablet (500 mg total) by mouth at bedtime.   Multiple Vitamin (MULTIVITAMIN WITH MINERALS) TABS tablet Take 1 tablet by mouth daily.   scopolamine (TRANSDERM SCOP, 1.5 MG,) 1 MG/3DAYS Place 1 patch (1.5 mg total) onto the skin every 3 (three) days.   traZODone (DESYREL) 50 MG tablet Take 50 mg by mouth at bedtime.   Vitamin D, Ergocalciferol, (DRISDOL) 1.25 MG (50000 UNIT) CAPS capsule Take 1 capsule (50,000 Units total) by mouth every 7 (seven) days.       09/20/2023   10:27 AM 07/17/2023    2:47 PM 03/06/2023    2:53 PM 03/11/2018   11:16 AM  GAD 7 : Generalized Anxiety Score  Nervous, Anxious, on Edge 1 0 1 1  Control/stop worrying 0 0 0 0  Worry too much - different things 0 0 0 0  Trouble relaxing 0 0 0 1  Restless 0 0 0 0  Easily annoyed or irritable 0 0 0 0  Afraid - awful might happen 1 0 0 0  Total GAD 7 Score 2 0 1 2  Anxiety Difficulty  Not difficult at all Not difficult at all Not difficult at all       09/20/2023   10:27 AM 07/17/2023    2:47 PM 03/06/2023    2:53 PM  Depression screen PHQ 2/9  Decreased Interest 1 0 1  Down, Depressed, Hopeless 0 0 0  PHQ - 2 Score 1 0 1  Altered sleeping 1  2  Tired, decreased energy 2  2  Change in appetite 0  1  Feeling bad or failure about yourself  0  0  Trouble concentrating 1  0  Moving slowly or fidgety/restless 0  0  Suicidal thoughts 0  0  PHQ-9 Score 5  6  Difficult doing work/chores   Not difficult at all    BP Readings from Last 3 Encounters:  09/20/23 114/74  08/26/23 112/71  08/07/23 117/60    Physical Exam Constitutional:      General: She is not in acute distress. Cardiovascular:     Rate and Rhythm: Normal rate and regular rhythm.  Pulmonary:      Effort: Pulmonary effort is normal.     Breath sounds: No wheezing or rhonchi.  Musculoskeletal:     Lumbar back: Spasms and tenderness (over right SI region) present. Normal range of motion. Negative  right straight leg raise test and negative left straight leg raise test.     Right hip: Normal.     Left hip: Normal.     Right knee: Normal.     Left knee: Normal.  Neurological:     Mental Status: She is alert.     Wt Readings from Last 3 Encounters:  09/20/23 224 lb 4 oz (101.7 kg)  08/26/23 220 lb (99.8 kg)  08/07/23 218 lb (98.9 kg)    BP 114/74   Pulse 85   Ht 5\' 3"  (1.6 m)   Wt 224 lb 4 oz (101.7 kg)   LMP 08/25/2023 (Approximate)   SpO2 97%   BMI 39.72 kg/m   Assessment and Plan:  Problem List Items Addressed This Visit   None Visit Diagnoses       Sacroiliac joint pain    -  Primary   recommend continuing heat will add Mobic and Robaxin if symptoms persist - will refer to PTx   Relevant Medications   meloxicam (MOBIC) 15 MG tablet   methocarbamol (ROBAXIN) 500 MG tablet       No follow-ups on file.    Reubin [name], MD [facility] [place]

## 2023-09-23 ENCOUNTER — Telehealth (INDEPENDENT_AMBULATORY_CARE_PROVIDER_SITE_OTHER): Payer: 59 | Admitting: Psychology

## 2023-09-23 DIAGNOSIS — F5089 Other specified eating disorder: Secondary | ICD-10-CM

## 2023-09-23 NOTE — Progress Notes (Signed)
  Office: [phone]  /  Fax: [phone]    Date: September 23, 2023  Appointment Start Time: 11:01am Duration: 24 minutes Provider: Lawerance Cruel, Psy.D. Type of Session: Individual Therapy  Location of Patient: Work (private location) Location of Provider: Provider's Home (private office) Type of Contact: Telepsychological Visit via MyChart Video Visit  Session Content: Leah Brown is a 43 y.o. female presenting for a follow-up appointment to address the previously established treatment goal of increasing coping skills.Today's appointment was a telepsychological visit. Leah Brown provided verbal consent for today's telepsychological appointment and she is aware she is responsible for securing confidentiality on her end of the session. Prior to proceeding with today's appointment, Leah Brown's physical location at the time of this appointment was obtained as well a phone number she could be reached at in the event of technical difficulties. Leah Brown and this provider participated in today's telepsychological service.   This provider conducted a brief check-in. Leah Brown reported ongoing uncertainty at work; however, discussed using thought defusion to assist with coping. She also reported she "eliminated" news consumption to further assist with coping. Session focused further on thought defusion to assist with coping. Leah Brown was led through a thought defusion exercise (Leaves on a Stream) and her experience was processed. Leah Brown provided verbal consent during today's appointment for this provider to send a handout for today's exercise via e-mail. Leah Brown was receptive to today's appointment as evidenced by openness to sharing, responsiveness to feedback, and willingness to continue engaging in thought defusion exercises.  Mental Status Examination:  Appearance: neat Behavior: appropriate to circumstances Mood: sad Affect: mood congruent Speech: WNL Eye Contact: appropriate Psychomotor Activity: WNL Gait: unable to  assess Thought Process: linear, logical, and goal directed and no evidence or endorsement of suicidal, homicidal, and self-harm ideation, plan and intent  Thought Content/Perception: no hallucinations, delusions, bizarre thinking or behavior endorsed or observed Orientation: AAOx4 Memory/Concentration: intact Insight: good Judgment: good  Interventions:  Conducted a brief chart review Provided empathic reflections and validation Reviewed content from the previous session Provided positive reinforcement Employed supportive psychotherapy interventions to facilitate reduced distress and to improve coping skills with identified stressors Engaged patient in thought defusion exercise(s)  DSM-5 Diagnosis(es):  F50.89 Other Specified Feeding or Eating Disorder, Emotional and Binge Eating Behaviors  Treatment Goal & Progress: During the initial appointment with this provider, the following treatment goal was established: increase coping skills. Leah Brown has demonstrated progress in her goal as evidenced by increased awareness of hunger patterns and increased awareness of triggers for emotional eating behaviors. Leah Brown also continues to demonstrate willingness to engage in learned skill(s).  Plan: The next appointment is scheduled for 10/15/2023 at 10am, which will be via MyChart Video Visit. The next session will focus on working towards the established treatment goal.   Lawerance Cruel, PsyD

## 2023-09-25 ENCOUNTER — Ambulatory Visit: Payer: Self-pay | Admitting: Internal Medicine

## 2023-10-09 ENCOUNTER — Encounter (INDEPENDENT_AMBULATORY_CARE_PROVIDER_SITE_OTHER): Payer: Self-pay | Admitting: Family Medicine

## 2023-10-09 ENCOUNTER — Ambulatory Visit (INDEPENDENT_AMBULATORY_CARE_PROVIDER_SITE_OTHER): Payer: 59 | Admitting: Family Medicine

## 2023-10-09 VITALS — BP 105/69 | HR 76 | Ht 63.0 in | Wt 222.0 lb

## 2023-10-09 DIAGNOSIS — E559 Vitamin D deficiency, unspecified: Secondary | ICD-10-CM | POA: Diagnosis not present

## 2023-10-09 DIAGNOSIS — F5089 Other specified eating disorder: Secondary | ICD-10-CM | POA: Diagnosis not present

## 2023-10-09 DIAGNOSIS — F39 Unspecified mood [affective] disorder: Secondary | ICD-10-CM

## 2023-10-09 DIAGNOSIS — E669 Obesity, unspecified: Secondary | ICD-10-CM

## 2023-10-09 DIAGNOSIS — Z6839 Body mass index (BMI) 39.0-39.9, adult: Secondary | ICD-10-CM

## 2023-10-09 DIAGNOSIS — R7303 Prediabetes: Secondary | ICD-10-CM

## 2023-10-09 DIAGNOSIS — S3992XA Unspecified injury of lower back, initial encounter: Secondary | ICD-10-CM | POA: Diagnosis not present

## 2023-10-09 MED ORDER — METFORMIN HCL 500 MG PO TABS
ORAL_TABLET | ORAL | 0 refills | Status: DC
Start: 2023-10-09 — End: 2024-01-23

## 2023-10-09 MED ORDER — VITAMIN D (ERGOCALCIFEROL) 1.25 MG (50000 UNIT) PO CAPS
50000.0000 [IU] | ORAL_CAPSULE | ORAL | 0 refills | Status: DC
Start: 2023-10-09 — End: 2023-11-14

## 2023-10-09 MED ORDER — BUPROPION HCL ER (SR) 150 MG PO TB12
150.0000 mg | ORAL_TABLET | Freq: Two times a day (BID) | ORAL | 0 refills | Status: DC
Start: 2023-10-09 — End: 2023-11-14

## 2023-10-09 NOTE — Progress Notes (Signed)
 Office: [phone]  /  Fax: [phone]  WEIGHT SUMMARY AND BIOMETRICS  Anthropometric Measurements Height: 5\' 3"  (1.6 m) Weight: 222 lb (100.7 kg) BMI (Calculated): 39.34 Weight at Last Visit: 220 lb Weight Lost Since Last Visit: 0 Weight Gained Since Last Visit: 2 lb Starting Weight: 228 lb Total Weight Loss (lbs): 6 lb (2.722 kg) Peak Weight: 235 lb   Body Composition  Body Fat %: 44.3 % Fat Mass (lbs): 98.6 lbs Muscle Mass (lbs): 117.8 lbs Total Body Water (lbs): 89.8 lbs Visceral Fat Rating : 12   Other Clinical Data Fasting: No Labs: No Today's Visit #: 12 Starting Date: 02/13/23    Chief Complaint: OBESITY   History of Present Illness Leah Brown is a 43 year old female who presents for obesity treatment plan assessment and progress evaluation.  She has been prescribed either the category two plan or germamine with a calorie goal of 1200 and a protein goal of at least 75 grams. She follows her category two plan approximately 10% of the time and has gained two pounds in the last six weeks. She has difficulty adhering to her eating plan, particularly in incorporating protein into her diet, and inquires about protein powders that can be added to coffee without clumping. She has been unable to exercise due to a recent back injury.  She reports a recent back injury, which has significantly impacted her ability to exercise. She describes waking up one morning with pain in one of her hip muscles, which made it difficult to walk for about three weeks. She has been using muscle relaxers and visiting a chiropractor a couple of times a week. She plans to start physical therapy if the pain persists.  She experiences diarrhea when taking metformin twice a day, so she has reduced the dose to once a day, which alleviates the symptoms. Despite her back pain, her sleep has improved, and she is mostly sleeping through the night.  She has a history of emotional  eating behaviors and is being treated with bupropion SR 150 mg twice a day. She requests a refill for this medication.  She has a history of vitamin D deficiency and is on prescription vitamin D 50,000 international units weekly.  She works at [employer] and has been experiencing stress, which she attributes to her work environment. She has a hybrid work schedule, working from home part of the time. She enjoys social interaction at work but also appreciates the productivity of working from home.      PHYSICAL EXAM:  Blood pressure 105/69, pulse 76, height 5\' 3"  (1.6 m), weight 222 lb (100.7 kg), last menstrual period 08/25/2023, SpO2 98%. Body mass index is 39.33 kg/m.  DIAGNOSTIC DATA REVIEWED:  BMET    Component Value Date/Time   NA 139 02/13/2023 0859   NA 138 06/11/2013 1014   K 4.5 02/13/2023 0859   K 3.8 06/11/2013 1014   CL 102 02/13/2023 0859   CL 102 06/11/2013 1014   CO2 22 02/13/2023 0859   CO2 27 06/11/2013 1014   GLUCOSE 100 (H) 02/13/2023 0859   GLUCOSE 96 06/25/2017 1205   GLUCOSE 96 06/11/2013 1014   BUN 8 02/13/2023 0859   BUN 11 06/11/2013 1014   CREATININE 0.79 02/13/2023 0859   CREATININE 0.72 06/25/2017 1205   CALCIUM 9.3 02/13/2023 0859   CALCIUM 8.7 06/11/2013 1014   GFRNONAA 97 03/05/2018 0835   GFRNONAA 108 06/25/2017 1205   GFRAA 111 03/05/2018 0835   GFRAA  126 06/25/2017 1205   Lab Results  Component Value Date   HGBA1C 5.7 (H) 02/13/2023   HGBA1C 5.2 10/31/2017   Lab Results  Component Value Date   INSULIN 19.7 02/13/2023   INSULIN 20.2 10/31/2017   Lab Results  Component Value Date   TSH 1.630 02/13/2023   CBC    Component Value Date/Time   WBC 7.1 02/13/2023 0859   WBC 8.4 06/25/2017 1205   RBC 4.61 02/13/2023 0859   RBC 4.51 06/25/2017 1205   HGB 14.0 02/13/2023 0859   HCT 42.7 02/13/2023 0859   PLT 289 02/13/2023 0859   MCV 93 02/13/2023 0859   MCV 91 06/11/2013 1014   MCH 30.4 02/13/2023 0859   MCH 30.4 06/25/2017  1205   MCHC 32.8 02/13/2023 0859   MCHC 33.7 06/25/2017 1205   RDW 13.2 02/13/2023 0859   RDW 12.5 06/11/2013 1014   Iron Studies No results found for: "IRON", "TIBC", "FERRITIN", "IRONPCTSAT" Lipid Panel     Component Value Date/Time   CHOL 201 (H) 02/13/2023 0859   CHOL 175 06/11/2013 1014   TRIG 109 02/13/2023 0859   TRIG 52 06/11/2013 1014   HDL 55 02/13/2023 0859   HDL 42 06/11/2013 1014   CHOLHDL 3.5 12/13/2016 0902   VLDL 13 12/13/2016 0902   VLDL 10 06/11/2013 1014   LDLCALC 127 (H) 02/13/2023 0859   LDLCALC 123 (H) 06/11/2013 1014   Hepatic Function Panel     Component Value Date/Time   PROT 7.0 02/13/2023 0859   PROT 7.3 06/11/2013 1014   ALBUMIN 4.3 02/13/2023 0859   ALBUMIN 3.9 06/11/2013 1014   AST 17 02/13/2023 0859   AST 13 (L) 06/11/2013 1014   ALT 20 02/13/2023 0859   ALT 21 06/11/2013 1014   ALKPHOS 59 02/13/2023 0859   ALKPHOS 52 06/11/2013 1014   BILITOT 0.3 02/13/2023 0859   BILITOT 0.4 06/11/2013 1014      Component Value Date/Time   TSH 1.630 02/13/2023 0859   Nutritional Lab Results  Component Value Date   VD25OH 24.1 (L) 02/13/2023   VD25OH 39.0 03/05/2018   VD25OH 28.3 (L) 10/31/2017     Assessment and Plan Assessment & Plan Obesity She is adhering to the category two plan approximately 10% of the time and has gained two pounds in the last six weeks. A recent back injury prevents exercise. Discussed the importance of protein intake and the thermogenic effect of food, emphasizing benefits of obtaining protein from real food rather than supplements. She expressed difficulty adhering to the eating plan and incorporating protein into her diet. Discussed strategies for incorporating protein into coffee, realistic goal setting, and stress management. Encouraged her to identify one achievable lifestyle improvement. - Continue category two plan with a calorie goal of 1200 and a protein goal of at least 75 grams - Discuss protein intake  strategies - Encourage realistic goal setting and stress management - Refer to Dr. Dewaine Conger for further support on stress eating and motivation  Emotional eating She exhibits emotional eating behaviors and is treated with bupropion SR 150 mg twice daily. Requested a refill. Discussed stress management and realistic goal setting in managing emotional eating. Encouraged exploration of stress reduction strategies and potential changes in her work environment to reduce stress. - Refill bupropion SR 150 mg twice daily - Discuss stress management strategies - Encourage follow-up with Dr. Dewaine Conger for further support  Back injury A recent back injury affecting a hip muscle initially caused significant mobility issues but  has improved. Currently seeing a chiropractor and plans to start physical therapy if symptoms persist. Advised against exercises like sit-ups that could exacerbate the condition and recommended low-impact activities like the elliptical or treadmill. Discussed core strengthening and potential benefits of physical therapy if chiropractic care is insufficient. - Continue chiropractic care - Consider physical therapy if symptoms persist - Avoid exercises that may exacerbate the back injury - Start with low-impact exercises like elliptical or treadmill  Prediabetes She experiences diarrhea with metformin twice daily but tolerates once-daily dosing. Recommended continuing once-daily dosing to allow gastrointestinal recovery, with potential gradual dose increase in the future if needed. - Continue metformin once daily - Consider gradual dose increase in the future if needed  Vitamin D deficiency She is on prescription vitamin D 50,000 IU weekly and requested a refill. - Refill prescription for vitamin D 50,000 IU weekly  Follow-up She does not have a follow-up appointment scheduled with the current provider but has an appointment with Dr. Dewaine Conger. - Schedule follow-up appointments with the  current provider - Ensure follow-up with Dr. Dewaine Conger     She was informed of the importance of frequent follow up visits to maximize her success with intensive lifestyle modifications for her multiple health conditions.    Quillian Quince, MD

## 2023-10-15 ENCOUNTER — Telehealth (INDEPENDENT_AMBULATORY_CARE_PROVIDER_SITE_OTHER): Admitting: Psychology

## 2023-10-15 DIAGNOSIS — F5089 Other specified eating disorder: Secondary | ICD-10-CM | POA: Diagnosis not present

## 2023-10-15 NOTE — Progress Notes (Signed)
  Office: [phone]  /  Fax: [phone]    Date: Noha 8, 2025  Appointment Start Time: 10:00am Duration: 33 minutes Provider: Lawerance Cruel, Psy.D. Type of Session: Individual Therapy  Location of Patient: Work (private location) Location of Provider: Provider's Home (private office) Type of Contact: Telepsychological Visit via MyChart Video Visit  Session Content: Leah Brown is a 43 y.o. female presenting for a follow-up appointment to address the previously established treatment goal of increasing coping skills.Today's appointment was a telepsychological visit. [name] provided verbal consent for today's telepsychological appointment and she is aware she is responsible for securing confidentiality on her end of the session. Prior to proceeding with today's appointment, [name]'s physical location at the time of this appointment was obtained as well a phone number she could be reached at in the event of technical difficulties. [name] and this provider participated in today's telepsychological service.   This provider conducted a brief check-in. [name] shared she has been reflecting on "why" she does not do "what [she is] supposed to do." She described feeling "stuck." Further explored and processed. Psychoeducation regarding values was provided and hypothetical scenarios were utilized to help [name] identify her values. The following values were identified: family, stability, achievement, and connection. Additionally, session focused on discussing setting goals congruent to identified values that would help her with her journey with [name]. Overall, [name] was receptive to today's appointment as evidenced by openness to sharing, responsiveness to feedback, and  willingness to establish short-and long-term goals congruent to her identified values .  Mental Status Examination:  Appearance: neat Behavior: appropriate to circumstances Mood: neutral Affect: mood congruent Speech: WNL Eye Contact:  appropriate Psychomotor Activity: WNL Gait: unable to assess Thought Process: linear, logical, and goal directed and no evidence or endorsement of suicidal, homicidal, and self-harm ideation, plan and intent  Thought Content/Perception: no hallucinations, delusions, bizarre thinking or behavior endorsed or observed Orientation: AAOx4 Memory/Concentration: intact Insight: good Judgment: good  Interventions:  Conducted a brief chart review Provided empathic reflections and validation Provided positive reinforcement Employed supportive psychotherapy interventions to facilitate reduced distress and to improve coping skills with identified stressors Psychoeducation provided regarding values Engaged patient in a values card sort activity  DSM-5 Diagnosis(es): F50.89 Other Specified Feeding or Eating Disorder, Emotional and Binge Eating Behaviors  Treatment Goal & Progress: The following treatment goal was established: increase coping skills. [name] has demonstrated progress in her goal as evidenced by increased awareness of hunger patterns and increased awareness of triggers for emotional eating behaviors. [name] also continues to demonstrate willingness to engage in learned skill(s).   Plan: The next appointment is scheduled for 11/05/2023 at 10am, which will be via MyChart Video Visit. The next session will focus on working towards the established treatment goal.   Lawerance Cruel, PsyD

## 2023-11-03 ENCOUNTER — Other Ambulatory Visit (INDEPENDENT_AMBULATORY_CARE_PROVIDER_SITE_OTHER): Payer: Self-pay | Admitting: Family Medicine

## 2023-11-03 DIAGNOSIS — R7303 Prediabetes: Secondary | ICD-10-CM

## 2023-11-05 ENCOUNTER — Telehealth (INDEPENDENT_AMBULATORY_CARE_PROVIDER_SITE_OTHER): Admitting: Psychology

## 2023-11-05 DIAGNOSIS — F5089 Other specified eating disorder: Secondary | ICD-10-CM

## 2023-11-05 NOTE — Progress Notes (Signed)
  Office: [phone]  /  Fax: [phone]    Date: Mafalda 29, 2025  Appointment Start Time: 10:01am Duration: 25 minutes Provider: Catherene Close, Psy.D. Type of Session: Individual Therapy  Location of Patient: Work (private location) Location of Provider: Provider's Home (private office) Type of Contact: Telepsychological Visit via MyChart Video Visit  Session Content: Leah Brown is a 43 y.o. female presenting for a follow-up appointment to address the previously established treatment goal of increasing coping skills.Today's appointment was a telepsychological visit. Leah Brown provided verbal consent for today's telepsychological appointment and she is aware she is responsible for securing confidentiality on her end of the session. Prior to proceeding with today's appointment, Leah Brown's physical location at the time of this appointment was obtained as well a phone number she could be reached at in the event of technical difficulties. Leah Brown and this provider participated in today's telepsychological service.   This provider conducted a brief check-in. Leah Brown shared about her birthday and work-related tasks. She further described summer as a "unique time for eating" due to having food in the office for breakfast and lunch, additional work tasks, and graduate school tasks. She reflected on her values and how they can be "incorporated" in her choices during the summer. She was also engaged in problem solving. Overall, Leah Brown was receptive to today's appointment as evidenced by openness to sharing, responsiveness to feedback, and willingness to implement discussed strategies .  Mental Status Examination:  Appearance: neat Behavior: appropriate to circumstances Mood: neutral Affect: mood congruent Speech: WNL Eye Contact: appropriate Psychomotor Activity: WNL Gait: unable to assess Thought Process: linear, logical, and goal directed and no evidence or endorsement of suicidal, homicidal, and self-harm  ideation, plan and intent  Thought Content/Perception: no hallucinations, delusions, bizarre thinking or behavior endorsed or observed Orientation: AAOx4 Memory/Concentration: intact Insight: good Judgment: good  Interventions:  Conducted a brief chart review Provided empathic reflections and validation Reviewed content from the previous session Provided positive reinforcement Employed supportive psychotherapy interventions to facilitate reduced distress and to improve coping skills with identified stressors Engaged patient in problem solving  DSM-5 Diagnosis(es): F50.89 Other Specified Feeding or Eating Disorder, Emotional and Binge Eating Behaviors  Treatment Goal & Progress: The following treatment goal was established: increase coping skills. Leah Brown has demonstrated progress in her goal as evidenced by increased awareness of hunger patterns and increased awareness of triggers for emotional eating behaviors. Leah Brown also continues to demonstrate willingness to engage in learned skill(s).   Plan: The next appointment is scheduled for 11/19/2023 at 3pm, which will be via MyChart Video Visit. The next session will focus on working towards the established treatment goal.   Catherene Close, PsyD

## 2023-11-14 ENCOUNTER — Encounter (INDEPENDENT_AMBULATORY_CARE_PROVIDER_SITE_OTHER): Payer: Self-pay | Admitting: Family Medicine

## 2023-11-14 ENCOUNTER — Ambulatory Visit (INDEPENDENT_AMBULATORY_CARE_PROVIDER_SITE_OTHER): Admitting: Family Medicine

## 2023-11-14 VITALS — BP 106/70 | HR 76 | Temp 98.0°F | Ht 63.0 in | Wt 226.0 lb

## 2023-11-14 DIAGNOSIS — E669 Obesity, unspecified: Secondary | ICD-10-CM

## 2023-11-14 DIAGNOSIS — F5089 Other specified eating disorder: Secondary | ICD-10-CM

## 2023-11-14 DIAGNOSIS — E559 Vitamin D deficiency, unspecified: Secondary | ICD-10-CM

## 2023-11-14 DIAGNOSIS — Z6841 Body Mass Index (BMI) 40.0 and over, adult: Secondary | ICD-10-CM

## 2023-11-14 DIAGNOSIS — R197 Diarrhea, unspecified: Secondary | ICD-10-CM

## 2023-11-14 DIAGNOSIS — F3289 Other specified depressive episodes: Secondary | ICD-10-CM

## 2023-11-14 DIAGNOSIS — R7303 Prediabetes: Secondary | ICD-10-CM | POA: Diagnosis not present

## 2023-11-14 MED ORDER — VITAMIN D (ERGOCALCIFEROL) 1.25 MG (50000 UNIT) PO CAPS: 50000.0000 [IU] | ORAL_CAPSULE | ORAL | 0 refills | Status: DC

## 2023-11-14 MED ORDER — BUPROPION HCL ER (SR) 150 MG PO TB12: 150.0000 mg | ORAL_TABLET | Freq: Two times a day (BID) | ORAL | 0 refills | Status: DC

## 2023-11-14 NOTE — Progress Notes (Signed)
 Office: [phone]  /  Fax: [phone]  WEIGHT SUMMARY AND BIOMETRICS  Anthropometric Measurements Height: 5\' 3"  (1.6 m) Weight: 226 lb (102.5 kg) BMI (Calculated): 40.04 Weight at Last Visit: 222 lb Weight Lost Since Last Visit: 0 Weight Gained Since Last Visit: 4 lb Starting Weight: 228 lb Total Weight Loss (lbs): 2 lb (0.907 kg) Peak Weight: 235 lb   Body Composition  Body Fat %: 44.8 % Fat Mass (lbs): 101.2 lbs Muscle Mass (lbs): 118.4 lbs Total Body Water (lbs): 88.4 lbs Visceral Fat Rating : 12   Other Clinical Data Fasting: no Labs: no Today's Visit #: 13 Starting Date: 02/13/23    Chief Complaint: OBESITY   History of Present Illness Leah Brown is a 43 year old female with obesity and prediabetes who presents for obesity treatment plan assessment and progress evaluation.  She is experiencing difficulty adhering to her category two obesity treatment plan, following it only about ten percent of the time. She has not been able to exercise and has gained four pounds in the last month. She is currently taking bupropion  to help with emotional eating behaviors, vitamin D  for vitamin D  deficiency, and metformin  for prediabetes.  She has been experiencing diarrhea for the past couple of weeks, occurring after every meal regardless of food type. The diarrhea is described as very loose, sometimes watery, and she feels that her food is not digesting properly. This has impacted her life significantly, as she is afraid to walk too far from home due to the need to find a bathroom quickly. She suspects the diarrhea may be related to her metformin  use or the absence of her gallbladder, which was removed during the COVID pandemic, either in 2022 or 2023.  She is concerned that the diarrhea might be causing dehydration, leading to increased thirst and possibly affecting the absorption of her medications, including Wellbutrin , as she has noticed an increase in  anxiety. She recalls that Wellbutrin  initially helped with hunger cues, but this effect has diminished.  She is interested in exploring medication options for weight loss but is limited by her state insurance, which only covers orlistat and Qsymia. She wants a medication that will help curb her appetite to make better dietary decisions.      PHYSICAL EXAM:  Blood pressure 106/70, pulse 76, temperature 98 F (36.7 C), height 5\' 3"  (1.6 m), weight 226 lb (102.5 kg), SpO2 98%. Body mass index is 40.03 kg/m.  DIAGNOSTIC DATA REVIEWED:  BMET    Component Value Date/Time   NA 139 02/13/2023 0859   NA 138 06/11/2013 1014   K 4.5 02/13/2023 0859   K 3.8 06/11/2013 1014   CL 102 02/13/2023 0859   CL 102 06/11/2013 1014   CO2 22 02/13/2023 0859   CO2 27 06/11/2013 1014   GLUCOSE 100 (H) 02/13/2023 0859   GLUCOSE 96 06/25/2017 1205   GLUCOSE 96 06/11/2013 1014   BUN 8 02/13/2023 0859   BUN 11 06/11/2013 1014   CREATININE 0.79 02/13/2023 0859   CREATININE 0.72 06/25/2017 1205   CALCIUM 9.3 02/13/2023 0859   CALCIUM 8.7 06/11/2013 1014   GFRNONAA 97 03/05/2018 0835   GFRNONAA 108 06/25/2017 1205   GFRAA 111 03/05/2018 0835   GFRAA 126 06/25/2017 1205   Lab Results  Component Value Date   HGBA1C 5.7 (H) 02/13/2023   HGBA1C 5.2 10/31/2017   Lab Results  Component Value Date   INSULIN  19.7 02/13/2023   INSULIN  20.2 10/31/2017   Lab  Results  Component Value Date   TSH 1.630 02/13/2023   CBC    Component Value Date/Time   WBC 7.1 02/13/2023 0859   WBC 8.4 06/25/2017 1205   RBC 4.61 02/13/2023 0859   RBC 4.51 06/25/2017 1205   HGB 14.0 02/13/2023 0859   HCT 42.7 02/13/2023 0859   PLT 289 02/13/2023 0859   MCV 93 02/13/2023 0859   MCV 91 06/11/2013 1014   MCH 30.4 02/13/2023 0859   MCH 30.4 06/25/2017 1205   MCHC 32.8 02/13/2023 0859   MCHC 33.7 06/25/2017 1205   RDW 13.2 02/13/2023 0859   RDW 12.5 06/11/2013 1014   Iron Studies No results found for: "IRON",  "TIBC", "FERRITIN", "IRONPCTSAT" Lipid Panel     Component Value Date/Time   CHOL 201 (H) 02/13/2023 0859   CHOL 175 06/11/2013 1014   TRIG 109 02/13/2023 0859   TRIG 52 06/11/2013 1014   HDL 55 02/13/2023 0859   HDL 42 06/11/2013 1014   CHOLHDL 3.5 12/13/2016 0902   VLDL 13 12/13/2016 0902   VLDL 10 06/11/2013 1014   LDLCALC 127 (H) 02/13/2023 0859   LDLCALC 123 (H) 06/11/2013 1014   Hepatic Function Panel     Component Value Date/Time   PROT 7.0 02/13/2023 0859   PROT 7.3 06/11/2013 1014   ALBUMIN 4.3 02/13/2023 0859   ALBUMIN 3.9 06/11/2013 1014   AST 17 02/13/2023 0859   AST 13 (L) 06/11/2013 1014   ALT 20 02/13/2023 0859   ALT 21 06/11/2013 1014   ALKPHOS 59 02/13/2023 0859   ALKPHOS 52 06/11/2013 1014   BILITOT 0.3 02/13/2023 0859   BILITOT 0.4 06/11/2013 1014      Component Value Date/Time   TSH 1.630 02/13/2023 0859   Nutritional Lab Results  Component Value Date   VD25OH 24.1 (L) 02/13/2023   VD25OH 39.0 03/05/2018   VD25OH 28.3 (L) 10/31/2017     Assessment and Plan Assessment & Plan Diarrhea Diarrhea has persisted for several weeks, occurring postprandially, likely due to metformin  use and cholecystectomy. It affects quality of life and may impact medication absorption. - Hold metformin  temporarily to assess impact on diarrhea. - Apply zinc oxide ointment for skin irritation due to diarrhea. - Consult primary care or gastroenterology if diarrhea persists after holding metformin .  Obesity Obesity management is hindered by emotional eating and non-adherence to the treatment plan, with a weight gain of four pounds in the last month. Discussed potential initiation of Qsymia (phentermine/topiramate) for weight loss, considering insurance coverage and side effects. Qsymia is contraindicated in pregnancy due to teratogenicity, necessitating effective contraception. Emphasized addressing diarrhea before initiating new medications. - Consider starting Qsymia  after diarrhea resolution and metformin  cessation. - Discuss potential side effects of Qsymia, including dysgeusia and xerostomia. - Ensure effective contraception if initiating Qsymia due to teratogenic risk.  Emotional eating behavior Emotional eating contributes to obesity. Currently managed with bupropion , but diarrhea may reduce its absorption. Discussed potential overlap with phentermine, which may necessitate discontinuation of bupropion  when starting Qsymia. - Continue bupropion  for emotional eating behavior. - Monitor bupropion  effectiveness post-diarrhea management. - Consider discontinuing bupropion  when initiating Qsymia.  Prediabetes Prediabetes is managed with metformin , but metformin  is suspected to contribute to diarrhea. Plan to hold metformin  temporarily to assess impact on diarrhea. - Hold metformin  temporarily to assess impact on diarrhea.  Vitamin D  deficiency Vitamin D  deficiency is managed with vitamin D  supplementation. - Continue vitamin D  supplementation.      She was informed of the  importance of frequent follow up visits to maximize her success with intensive lifestyle modifications for her multiple health conditions.    Jasmine Mesi, MD

## 2023-11-19 ENCOUNTER — Telehealth (INDEPENDENT_AMBULATORY_CARE_PROVIDER_SITE_OTHER): Admitting: Psychology

## 2023-11-19 DIAGNOSIS — F5089 Other specified eating disorder: Secondary | ICD-10-CM | POA: Diagnosis not present

## 2023-11-19 NOTE — Progress Notes (Signed)
  Office: [phone]  /  Fax: [phone]    Date: Nov 19, 2023  Appointment Start Time: 3:12pm Duration: 25 minutes Provider: Catherene Close, Psy.D. Type of Session: Individual Therapy  Location of Patient: Work (private location) Location of Provider: Provider's Home (private office) Type of Contact: Telepsychological Visit via MyChart Video Visit  Session Content: Leah Brown is a 43 y.o. female presenting for a follow-up appointment to address the previously established treatment goal of increasing coping skills.Today's appointment was a telepsychological visit. Leah Brown provided verbal consent for today's telepsychological appointment and she is aware she is responsible for securing confidentiality on her end of the session. Prior to proceeding with today's appointment, Leah Brown's physical location at the time of this appointment was obtained as well a phone number she could be reached at in the event of technical difficulties. Leah Brown and this provider participated in today's telepsychological service.   This provider conducted a brief check-in. Reviewed content from last appointment and Leah Brown shared examples of implementation of discussed strategies. Notably, she shared about her recent appointment with Dr. Alvia Brown. Associated thoughts and feelings were processed. Metformin  was reportedly discontinued due to side effects. Reviewed importance of water intake. Additionally, an increase in stress was reflected and stress management was reviewed. Overall, Leah Brown was receptive to today's appointment as evidenced by openness to sharing, responsiveness to feedback, and willingness to implement discussed strategies .  Mental Status Examination:  Appearance: neat Behavior: appropriate to circumstances Mood: sad Affect: mood congruent Speech: WNL Eye Contact: appropriate Psychomotor Activity: WNL Gait: unable to assess Thought Process: linear, logical, and goal directed and no evidence or endorsement of  suicidal, homicidal, and self-harm ideation, plan and intent  Thought Content/Perception: no hallucinations, delusions, bizarre thinking or behavior endorsed or observed Orientation: AAOx4 Memory/Concentration: intact Insight: good Judgment: good  Interventions:  Conducted a brief chart review Provided empathic reflections and validation Reviewed content from the previous session Provided positive reinforcement Employed supportive psychotherapy interventions to facilitate reduced distress and to improve coping skills with identified stressors  DSM-5 Diagnosis(es): F50.89 Other Specified Feeding or Eating Disorder, Emotional and Binge Eating Behaviors  Treatment Goal & Progress: The following treatment goal was established: increase coping skills. Leah Brown has demonstrated progress in her goal as evidenced by increased awareness of hunger patterns and increased awareness of triggers for emotional eating behaviors. Leah Brown also continues to demonstrate willingness to engage in learned skill(s).   Plan: The next appointment is scheduled for 12/24/2023 at 2:30pm, which will be via MyChart Video Visit. The next session will focus on working towards the established treatment goal.   Catherene Close, PsyD

## 2023-11-30 ENCOUNTER — Other Ambulatory Visit (INDEPENDENT_AMBULATORY_CARE_PROVIDER_SITE_OTHER): Payer: Self-pay | Admitting: Family Medicine

## 2023-11-30 DIAGNOSIS — R7303 Prediabetes: Secondary | ICD-10-CM

## 2023-12-10 ENCOUNTER — Encounter (INDEPENDENT_AMBULATORY_CARE_PROVIDER_SITE_OTHER): Payer: Self-pay | Admitting: Family Medicine

## 2023-12-10 ENCOUNTER — Ambulatory Visit (INDEPENDENT_AMBULATORY_CARE_PROVIDER_SITE_OTHER): Admitting: Family Medicine

## 2023-12-10 VITALS — BP 106/67 | HR 74 | Temp 98.2°F | Ht 63.0 in | Wt 225.0 lb

## 2023-12-10 DIAGNOSIS — E782 Mixed hyperlipidemia: Secondary | ICD-10-CM

## 2023-12-10 DIAGNOSIS — R7303 Prediabetes: Secondary | ICD-10-CM

## 2023-12-10 DIAGNOSIS — E559 Vitamin D deficiency, unspecified: Secondary | ICD-10-CM

## 2023-12-10 DIAGNOSIS — E785 Hyperlipidemia, unspecified: Secondary | ICD-10-CM

## 2023-12-10 DIAGNOSIS — E669 Obesity, unspecified: Secondary | ICD-10-CM

## 2023-12-10 DIAGNOSIS — F3289 Other specified depressive episodes: Secondary | ICD-10-CM | POA: Diagnosis not present

## 2023-12-10 DIAGNOSIS — Z6839 Body mass index (BMI) 39.0-39.9, adult: Secondary | ICD-10-CM

## 2023-12-10 DIAGNOSIS — Z6841 Body Mass Index (BMI) 40.0 and over, adult: Secondary | ICD-10-CM

## 2023-12-10 MED ORDER — TOPIRAMATE 25 MG PO TABS: 25.0000 mg | ORAL_TABLET | Freq: Every day | ORAL | 0 refills | Status: DC

## 2023-12-10 MED ORDER — BUPROPION HCL ER (SR) 150 MG PO TB12: 150.0000 mg | ORAL_TABLET | Freq: Two times a day (BID) | ORAL | 0 refills | Status: DC

## 2023-12-10 MED ORDER — VITAMIN D (ERGOCALCIFEROL) 1.25 MG (50000 UNIT) PO CAPS: 50000.0000 [IU] | ORAL_CAPSULE | ORAL | 0 refills | Status: DC

## 2023-12-10 NOTE — Progress Notes (Signed)
 Office: [phone]  /  Fax: [phone]  WEIGHT SUMMARY AND BIOMETRICS  Anthropometric Measurements Height: 5\' 3"  (1.6 m) Weight: 225 lb (102.1 kg) BMI (Calculated): 39.87 Weight at Last Visit: 226 lb Weight Lost Since Last Visit: 1 lb Weight Gained Since Last Visit: 0 Starting Weight: 228 lb Total Weight Loss (lbs): 3 lb (1.361 kg) Peak Weight: 235 lb   Body Composition  Body Fat %: 45 % Fat Mass (lbs): 101.6 lbs Muscle Mass (lbs): 118 lbs Total Body Water (lbs): 87.8 lbs Visceral Fat Rating : 12   Other Clinical Data Fasting: no Labs: no Today's Visit #: 14 Starting Date: 02/13/23    Chief Complaint: OBESITY   History of Present Illness Leah Brown is a 43 year old female who presents for a follow-up on her obesity treatment plan.  She is following a category two eating plan but adheres to it about ten percent of the time. She is attempting to increase her physical activity by walking for about ten minutes two or three times a week. Since her last visit, she has lost one pound.  She has a history of vitamin D  deficiency and is currently taking prescription vitamin D  at a dose of 50,000 IU weekly.  She has a history of emotional eating behaviors and is currently taking Wellbutrin  SR, 150 mg twice daily. She has recently stopped taking metformin  and birth control, and she feels better without them. No significant increase in hunger since stopping metformin .  Her sleep has been improving as she has been taking trazodone more routinely, which has helped reduce nighttime awakenings. She experiences 'weird dreams' as a side effect but no other significant issues.  She continues to experience some diarrhea, although it is less frequent and not daily. She is pleased with the reduction in symptoms.  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 She is trying to delegate tasks to her team to  manage stress and has been able to take lunch breaks more regularly.      PHYSICAL EXAM:  Blood pressure 106/67, pulse 74, temperature 98.2 F (36.8 C), height 5\' 3"  (1.6 m), weight 225 lb (102.1 kg), SpO2 99%. Body mass index is 39.86 kg/m.  DIAGNOSTIC DATA REVIEWED:  BMET    Component Value Date/Time   NA 139 02/13/2023 0859   NA 138 06/11/2013 1014   K 4.5 02/13/2023 0859   K 3.8 06/11/2013 1014   CL 102 02/13/2023 0859   CL 102 06/11/2013 1014   CO2 22 02/13/2023 0859   CO2 27 06/11/2013 1014   GLUCOSE 100 (H) 02/13/2023 0859   GLUCOSE 96 06/25/2017 1205   GLUCOSE 96 06/11/2013 1014   BUN 8 02/13/2023 0859   BUN 11 06/11/2013 1014   CREATININE 0.79 02/13/2023 0859   CREATININE 0.72 06/25/2017 1205   CALCIUM 9.3 02/13/2023 0859   CALCIUM 8.7 06/11/2013 1014   GFRNONAA 97 03/05/2018 0835   GFRNONAA 108 06/25/2017 1205   GFRAA 111 03/05/2018 0835   GFRAA 126 06/25/2017 1205   Lab Results  Component Value Date   HGBA1C 5.7 (H) 02/13/2023   HGBA1C 5.2 10/31/2017   Lab Results  Component Value Date   INSULIN  19.7 02/13/2023   INSULIN  20.2 10/31/2017   Lab Results  Component Value Date   TSH 1.630 02/13/2023   CBC    Component Value Date/Time   WBC 7.1 02/13/2023 0859   WBC 8.4 06/25/2017 1205  RBC 4.61 02/13/2023 0859   RBC 4.51 06/25/2017 1205   HGB 14.0 02/13/2023 0859   HCT 42.7 02/13/2023 0859   PLT 289 02/13/2023 0859   MCV 93 02/13/2023 0859   MCV 91 06/11/2013 1014   MCH 30.4 02/13/2023 0859   MCH 30.4 06/25/2017 1205   MCHC 32.8 02/13/2023 0859   MCHC 33.7 06/25/2017 1205   RDW 13.2 02/13/2023 0859   RDW 12.5 06/11/2013 1014   Iron Studies No results found for: "IRON", "TIBC", "FERRITIN", "IRONPCTSAT" Lipid Panel     Component Value Date/Time   CHOL 201 (H) 02/13/2023 0859   CHOL 175 06/11/2013 1014   TRIG 109 02/13/2023 0859   TRIG 52 06/11/2013 1014   HDL 55 02/13/2023 0859   HDL 42 06/11/2013 1014   CHOLHDL 3.5 12/13/2016  0902   VLDL 13 12/13/2016 0902   VLDL 10 06/11/2013 1014   LDLCALC 127 (H) 02/13/2023 0859   LDLCALC 123 (H) 06/11/2013 1014   Hepatic Function Panel     Component Value Date/Time   PROT 7.0 02/13/2023 0859   PROT 7.3 06/11/2013 1014   ALBUMIN 4.3 02/13/2023 0859   ALBUMIN 3.9 06/11/2013 1014   AST 17 02/13/2023 0859   AST 13 (L) 06/11/2013 1014   ALT 20 02/13/2023 0859   ALT 21 06/11/2013 1014   ALKPHOS 59 02/13/2023 0859   ALKPHOS 52 06/11/2013 1014   BILITOT 0.3 02/13/2023 0859   BILITOT 0.4 06/11/2013 1014      Component Value Date/Time   TSH 1.630 02/13/2023 0859   Nutritional Lab Results  Component Value Date   VD25OH 24.1 (L) 02/13/2023   VD25OH 39.0 03/05/2018   VD25OH 28.3 (L) 10/31/2017     Assessment and Plan Assessment & Plan Obesity Adheres to category two eating plan 10% of the time, walks 10 minutes 2-3 times weekly, and lost one pound last month. Emotional eating behaviors present. On Wellbutrin  SR 150 mg BID. Discontinued metformin  due to severe diarrhea without significant change in hunger. Discussed topiramate for weight loss, beneficial for emotional eating, with side effects including taste disturbances and risk of birth defects, requiring reliable contraception. No GI issues or weight gain associated. Starting dose 25 mg, potentially increasing to 100 mg. - Start topiramate 25 mg daily - Continue Wellbutrin  SR 150 mg BID - Continue category two eating plan - Continue walking for exercise as time allows  Prediabetes Intolerant to metformin  due to severe diarrhea. Plan to monitor glucose, insulin , and A1c levels. - Order labs to check glucose, insulin , and A1c levels  Hyperlipidemia Managing with diet and exercise. Plan to check cholesterol levels. - Order labs to check cholesterol levels  Vitamin D  Deficiency On prescription vitamin D . Discussed risk of over-replacement affecting nerve cells. - Refill prescription vitamin D  - Order labs to  check vitamin D  levels  General Health Maintenance Discussed importance of routine checks for kidney, liver, electrolytes, and potential B12 deficiency without signs of pernicious anemia. - Order labs to check kidney, liver, and electrolytes - Order B12 level  Follow-up Follow-up appointment on July 17th. Importance of scheduling August appointment due to busy schedule and potential medication adjustments discussed. - Follow up on July 17th - Schedule August appointment      She was informed of the importance of frequent follow up visits to maximize her success with intensive lifestyle modifications for her multiple health conditions.    Jasmine Mesi, MD

## 2023-12-11 ENCOUNTER — Ambulatory Visit (INDEPENDENT_AMBULATORY_CARE_PROVIDER_SITE_OTHER): Admitting: Family Medicine

## 2023-12-24 ENCOUNTER — Telehealth (INDEPENDENT_AMBULATORY_CARE_PROVIDER_SITE_OTHER): Admitting: Psychology

## 2023-12-24 DIAGNOSIS — F5089 Other specified eating disorder: Secondary | ICD-10-CM | POA: Diagnosis not present

## 2023-12-24 NOTE — Progress Notes (Signed)
  Office: [phone]  /  Fax: [phone]    Date: December 24, 2023  Appointment Start Time: 2:33pm Duration: 28 minutes Provider: Catherene Close, Psy.D. Type of Session: Individual Therapy  Location of Patient: Work (private location) Location of Provider: Provider's Home (private office) Type of Contact: Telepsychological Visit via MyChart Video Visit  Session Content: Leah Brown is a 43 y.o. female presenting for a follow-up appointment to address the previously established treatment goal of increasing coping skills.Today's appointment was a telepsychological visit. Leah Brown provided verbal consent for today's telepsychological appointment and she is aware she is responsible for securing confidentiality on her end of the session. Prior to proceeding with today's appointment, Leah Brown's physical location at the time of this appointment was obtained as well a phone number she could be reached at in the event of technical difficulties. Leah Brown and this provider participated in today's telepsychological service.   This provider conducted a brief check-in. Kaly described work as being a lot. She also discussed trying to be more mindful when eating at work to avoid scarf[ing] her food and described making better choices and engaging in portion control. Notably, Leah Brown described experiencing stress and worry regarding work-related tasks resulting in her feeling she is constantly in survival mode. Psychoeducation provided regarding the fight, flight, and freeze response, including coping as she described the aforementioned previously triggered emotional/binge eating behaviors. She expressed willingness to implement the following: going for massages, going for acupuncture, asking for help from colleagues, using visual reminders, practicing yoga, and walking. Overall, Leah Brown was receptive to today's appointment as evidenced by openness to sharing, responsiveness to feedback, and willingness to implement discussed  strategies .  Mental Status Examination:  Appearance: neat Behavior: appropriate to circumstances Mood: neutral Affect: mood congruent Speech: WNL Eye Contact: appropriate Psychomotor Activity: WNL Gait: unable to assess Thought Process: linear, logical, and goal directed and no evidence or endorsement of suicidal, homicidal, and self-harm ideation, plan and intent  Thought Content/Perception: no hallucinations, delusions, bizarre thinking or behavior endorsed or observed Orientation: AAOx4 Memory/Concentration: intact Insight: good Judgment: good  Interventions:  Conducted a brief chart review Provided empathic reflections and validation Provided positive reinforcement Employed supportive psychotherapy interventions to facilitate reduced distress and to improve coping skills with identified stressors Psychoeducation provided regarding the fight, flight, and freeze response Engaged pt in problem solving  DSM-5 Diagnosis(es): F50.89 Other Specified Feeding or Eating Disorder, Emotional and Binge Eating Behaviors  Treatment Goal & Progress:  The following treatment goal was established: increase coping skills. Leah Brown has demonstrated progress in her goal as evidenced by increased awareness of hunger patterns, increased awareness of triggers for emotional eating behaviors, and reduction in emotional and binge eating behaviors. Leah Brown also continues to demonstrate willingness to engage in learned skill(s).   Plan: The next appointment is scheduled for 01/07/2024 at 2pm, which will be via MyChart Video Visit. The next session will focus on working towards the established treatment goal.   Catherene Close, PsyD

## 2023-12-27 LAB — HEMOGLOBIN A1C
Est. average glucose Bld gHb Est-mCnc: 105 mg/dL
Hgb A1c MFr Bld: 5.3 % (ref 4.8–5.6)

## 2023-12-27 LAB — CMP14+EGFR
ALT: 16 IU/L (ref 0–32)
AST: 17 IU/L (ref 0–40)
Albumin: 4.1 g/dL (ref 3.9–4.9)
Alkaline Phosphatase: 76 IU/L (ref 44–121)
BUN/Creatinine Ratio: 18 (ref 9–23)
BUN: 16 mg/dL (ref 6–24)
Bilirubin Total: 0.4 mg/dL (ref 0.0–1.2)
CO2: 18 mmol/L — ABNORMAL LOW (ref 20–29)
Calcium: 9.1 mg/dL (ref 8.7–10.2)
Chloride: 103 mmol/L (ref 96–106)
Creatinine, Ser: 0.87 mg/dL (ref 0.57–1.00)
Globulin, Total: 2.2 g/dL (ref 1.5–4.5)
Glucose: 95 mg/dL (ref 70–99)
Potassium: 4.1 mmol/L (ref 3.5–5.2)
Sodium: 136 mmol/L (ref 134–144)
Total Protein: 6.3 g/dL (ref 6.0–8.5)
eGFR: 85 mL/min/{1.73_m2} (ref >59)

## 2023-12-27 LAB — LIPID PANEL WITH LDL/HDL RATIO
Cholesterol, Total: 175 mg/dL (ref 100–199)
HDL: 38 mg/dL — ABNORMAL LOW (ref >39)
LDL Chol Calc (NIH): 116 mg/dL — ABNORMAL HIGH (ref 0–99)
LDL/HDL Ratio: 3.1 ratio (ref 0.0–3.2)
Triglycerides: 114 mg/dL (ref 0–149)
VLDL Cholesterol Cal: 21 mg/dL (ref 5–40)

## 2023-12-27 LAB — INSULIN, RANDOM: INSULIN: 16.8 u[IU]/mL (ref 2.6–24.9)

## 2023-12-27 LAB — VITAMIN D 25 HYDROXY (VIT D DEFICIENCY, FRACTURES): Vit D, 25-Hydroxy: 34.8 ng/mL (ref 30.0–100.0)

## 2023-12-27 LAB — VITAMIN B12: Vitamin B-12: 512 pg/mL (ref 232–1245)

## 2024-01-06 ENCOUNTER — Other Ambulatory Visit (INDEPENDENT_AMBULATORY_CARE_PROVIDER_SITE_OTHER): Payer: Self-pay | Admitting: Family Medicine

## 2024-01-06 DIAGNOSIS — F3289 Other specified depressive episodes: Secondary | ICD-10-CM

## 2024-01-07 ENCOUNTER — Telehealth (INDEPENDENT_AMBULATORY_CARE_PROVIDER_SITE_OTHER): Admitting: Psychology

## 2024-01-07 DIAGNOSIS — F5089 Other specified eating disorder: Secondary | ICD-10-CM

## 2024-01-07 NOTE — Progress Notes (Signed)
  Office: [phone]  /  Fax: [phone]    Date: January 07, 2024  Appointment Start Time: 2:03pm Duration: 29 minutes Provider: Wyatt Fire, Psy.D. Type of Session: Individual Therapy  Location of Patient: Home (private location) Location of Provider: Provider's Home (private office) Type of Contact: Telepsychological Visit via MyChart Video Visit  Session Content: Leah Brown is a 43 y.o. female presenting for a follow-up appointment to address the previously established treatment goal of increasing coping skills. Today's appointment was a telepsychological visit. Leah Brown provided verbal consent for today's telepsychological appointment and she is aware she is responsible for securing confidentiality on her end of the session. Prior to proceeding with today's appointment, Leah Brown's physical location at the time of this appointment was obtained as well a phone number she could be reached at in the event of technical difficulties. Leah Brown and this provider participated in today's telepsychological service.   This provider conducted a brief check-in. Leah Brown stated she has been busy with work as it is the end of the fiscal year. Reviewed stress, including the fight or flight response. Leah Brown indicated she is able to better notice when she hit[s] the max stress, which allows her to re-evaluate her circumstances. Explored how the aforementioned has impacted her eating habits. Leah Brown reflected she is making better choices and engaging in portion control, adding there has been a reduction in cravings despite stress. Discussed strategies for stress management (e.g., engaging in physical activity, journaling, minimizing screen time, eating a balanced diet, coloring). Overall, Leah Brown was receptive to today's appointment as evidenced by openness to sharing, responsiveness to feedback, and willingness to implement discussed strategies .  Mental Status Examination:  Appearance: neat Behavior: appropriate to  circumstances Mood: neutral Affect: mood congruent Speech: WNL Eye Contact: appropriate Psychomotor Activity: WNL Gait: unable to assess Thought Process: linear, logical, and goal directed and no evidence or endorsement of suicidal, homicidal, and self-harm ideation, plan and intent  Thought Content/Perception: no hallucinations, delusions, bizarre thinking or behavior endorsed or observed Orientation: AAOx4 Memory/Concentration: intact Insight: good Judgment: good  Interventions:  Conducted a brief chart review Provided empathic reflections and validation Reviewed content from the previous session Provided positive reinforcement Employed supportive psychotherapy interventions to facilitate reduced distress and to improve coping skills with identified stressors Engaged patient in problem solving Discussed fight or flight (and freeze) system further  DSM-5 Diagnosis(es): F50.89 Other Specified Feeding or Eating Disorder, Emotional and Binge Eating Behaviors  Treatment Goal & Progress: The following treatment goal was established: increase coping skills. Leah Brown has demonstrated progress in her goal as evidenced by increased awareness of hunger patterns, increased awareness of triggers for emotional eating behaviors, and reduction in emotional and binge eating behaviors. Leah Brown also continues to demonstrate willingness to engage in learned skill(s).   Plan: The next appointment is scheduled for 01/27/2024 at 2:30pm, which will be via MyChart Video Visit. The next session will focus on working towards the established treatment goal.   Wyatt Fire, PsyD

## 2024-01-23 ENCOUNTER — Ambulatory Visit (INDEPENDENT_AMBULATORY_CARE_PROVIDER_SITE_OTHER): Admitting: Family Medicine

## 2024-01-23 ENCOUNTER — Encounter (INDEPENDENT_AMBULATORY_CARE_PROVIDER_SITE_OTHER): Payer: Self-pay | Admitting: Family Medicine

## 2024-01-23 VITALS — BP 118/68 | HR 77 | Temp 98.7°F | Ht 63.0 in | Wt 221.0 lb

## 2024-01-23 DIAGNOSIS — E669 Obesity, unspecified: Secondary | ICD-10-CM

## 2024-01-23 DIAGNOSIS — E559 Vitamin D deficiency, unspecified: Secondary | ICD-10-CM

## 2024-01-23 DIAGNOSIS — R7303 Prediabetes: Secondary | ICD-10-CM

## 2024-01-23 DIAGNOSIS — F5089 Other specified eating disorder: Secondary | ICD-10-CM

## 2024-01-23 DIAGNOSIS — E7849 Other hyperlipidemia: Secondary | ICD-10-CM

## 2024-01-23 DIAGNOSIS — Z6839 Body mass index (BMI) 39.0-39.9, adult: Secondary | ICD-10-CM

## 2024-01-23 DIAGNOSIS — F3289 Other specified depressive episodes: Secondary | ICD-10-CM

## 2024-01-23 DIAGNOSIS — E785 Hyperlipidemia, unspecified: Secondary | ICD-10-CM | POA: Diagnosis not present

## 2024-01-23 DIAGNOSIS — Z6841 Body Mass Index (BMI) 40.0 and over, adult: Secondary | ICD-10-CM

## 2024-01-23 MED ORDER — TOPIRAMATE 25 MG PO TABS: 25.0000 mg | ORAL_TABLET | Freq: Every day | ORAL | 0 refills | Status: DC

## 2024-01-23 MED ORDER — VITAMIN D (ERGOCALCIFEROL) 1.25 MG (50000 UNIT) PO CAPS
50000.0000 [IU] | ORAL_CAPSULE | ORAL | 0 refills | Status: DC
Start: 2024-01-23 — End: 2024-03-04

## 2024-01-23 MED ORDER — BUPROPION HCL ER (SR) 150 MG PO TB12: 150.0000 mg | ORAL_TABLET | Freq: Two times a day (BID) | ORAL | 0 refills | Status: DC

## 2024-01-23 NOTE — Progress Notes (Signed)
 Office: [phone]  /  Fax: [phone]  WEIGHT SUMMARY AND BIOMETRICS  Anthropometric Measurements Height: 5' 3 (1.6 m) Weight: 221 lb (100.2 kg) BMI (Calculated): 39.16 Weight at Last Visit: 225 lb Weight Lost Since Last Visit: 4 lb Weight Gained Since Last Visit: 0 Starting Weight: 228 lb Total Weight Loss (lbs): 7 lb (3.175 kg) Peak Weight: 235 lb   Body Composition  Body Fat %: 45.5 % Fat Mass (lbs): 100.6 lbs Muscle Mass (lbs): 114.4 lbs Total Body Water (lbs): 870 lbs Visceral Fat Rating : 12   Other Clinical Data Fasting: no Labs: no Today's Visit #: 15 Starting Date: 02/13/23    Chief Complaint: OBESITY   Discussed the use of AI scribe software for clinical note transcription with the patient, who gave verbal consent to proceed.  History of Present Illness Leah Brown is a 43 year old female with obesity and prediabetes who presents for obesity treatment.  She is actively managing her obesity by following a category two eating plan about 20% of the time and engaging in treadmill walking for 30 minutes, three times a week. She has lost four pounds in the last month. She takes topiramate  25 mg, which helps reduce cravings and aids in making healthier dietary choices, such as opting for smaller portions and choosing salads over fries.  She is managing prediabetes and previously discontinued metformin  due to severe gastrointestinal upset, specifically diarrhea, even when taken with food. Recent lab work shows improvement in her A1c from 5.7 to 5.3, fasting glucose from 100 to 95, and insulin  levels from 19 to 16.  Her vitamin D  levels have improved from 24 to 34, and she continues to take vitamin D  supplements without any issues.  Cholesterol levels remain a concern, with LDL cholesterol previously at 127, now improved to 116. However, her HDL cholesterol has decreased, which she attributes to a decrease in physical activity.  Her husband's  hospitalization with a severe eye infection has been a significant stressor. Despite this, she continues to make mindful dietary choices and manage her health conditions. 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 No brain fog from her medication, attributing any absent-mindedness to life stressors.      PHYSICAL EXAM:  Blood pressure 118/68, pulse 77, temperature 98.7 F (37.1 C), height 5' 3 (1.6 m), weight 221 lb (100.2 kg), SpO2 98%. Body mass index is 39.15 kg/m.  DIAGNOSTIC DATA REVIEWED:  BMET    Component Value Date/Time   NA 136 12/26/2023 0951   NA 138 06/11/2013 1014   K 4.1 12/26/2023 0951   K 3.8 06/11/2013 1014   CL 103 12/26/2023 0951   CL 102 06/11/2013 1014   CO2 18 (L) 12/26/2023 0951   CO2 27 06/11/2013 1014   GLUCOSE 95 12/26/2023 0951   GLUCOSE 96 06/25/2017 1205   GLUCOSE 96 06/11/2013 1014   BUN 16 12/26/2023 0951   BUN 11 06/11/2013 1014   CREATININE 0.87 12/26/2023 0951   CREATININE 0.72 06/25/2017 1205   CALCIUM 9.1 12/26/2023 0951   CALCIUM 8.7 06/11/2013 1014   GFRNONAA 97 03/05/2018 0835   GFRNONAA 108 06/25/2017 1205   GFRAA 111 03/05/2018 0835   GFRAA 126 06/25/2017 1205   Lab Results  Component Value Date   HGBA1C 5.3 12/26/2023   HGBA1C 5.2 10/31/2017   Lab Results  Component Value Date   INSULIN  16.8 12/26/2023   INSULIN  20.2 10/31/2017  Lab Results  Component Value Date   TSH 1.630 02/13/2023   CBC    Component Value Date/Time   WBC 7.1 02/13/2023 0859   WBC 8.4 06/25/2017 1205   RBC 4.61 02/13/2023 0859   RBC 4.51 06/25/2017 1205   HGB 14.0 02/13/2023 0859   HCT 42.7 02/13/2023 0859   PLT 289 02/13/2023 0859   MCV 93 02/13/2023 0859   MCV 91 06/11/2013 1014   MCH 30.4 02/13/2023 0859   MCH 30.4 06/25/2017 1205   MCHC 32.8 02/13/2023 0859   MCHC 33.7 06/25/2017 1205   RDW 13.2 02/13/2023 0859   RDW 12.5 06/11/2013 1014   Iron Studies No results found for:  IRON, TIBC, FERRITIN, IRONPCTSAT Lipid Panel     Component Value Date/Time   CHOL 175 12/26/2023 0951   CHOL 175 06/11/2013 1014   TRIG 114 12/26/2023 0951   TRIG 52 06/11/2013 1014   HDL 38 (L) 12/26/2023 0951   HDL 42 06/11/2013 1014   CHOLHDL 3.5 12/13/2016 0902   VLDL 13 12/13/2016 0902   VLDL 10 06/11/2013 1014   LDLCALC 116 (H) 12/26/2023 0951   LDLCALC 123 (H) 06/11/2013 1014   Hepatic Function Panel     Component Value Date/Time   PROT 6.3 12/26/2023 0951   PROT 7.3 06/11/2013 1014   ALBUMIN 4.1 12/26/2023 0951   ALBUMIN 3.9 06/11/2013 1014   AST 17 12/26/2023 0951   AST 13 (L) 06/11/2013 1014   ALT 16 12/26/2023 0951   ALT 21 06/11/2013 1014   ALKPHOS 76 12/26/2023 0951   ALKPHOS 52 06/11/2013 1014   BILITOT 0.4 12/26/2023 0951   BILITOT 0.4 06/11/2013 1014      Component Value Date/Time   TSH 1.630 02/13/2023 0859   Nutritional Lab Results  Component Value Date   VD25OH 34.8 12/26/2023   VD25OH 24.1 (L) 02/13/2023   VD25OH 39.0 03/05/2018     Assessment and Plan Assessment & Plan Obesity and Emotional Eating Behaviors She follows her category two eating plan 20% of the time and exercises on the treadmill for 30 minutes three times weekly, resulting in a four-pound weight loss over the last month. Topiramate  25 mg aids in reducing cravings, facilitating better dietary decisions. She is making mindful choices, such as opting for smaller portions and healthier alternatives. Emphasized the importance of portion control and smart choices, acknowledging her efforts despite life stressors. Discussed the need to challenge muscles to prevent muscle loss during weight loss, suggesting incline walking or using ankle weights. - Continue topiramate  25 mg - Encourage continued mindful eating and portion control - Increase physical activity, including incline walking or using ankle weights to maintain muscle mass  Prediabetes She discontinued metformin  due to  severe diarrhea, even when taken with food. Her A1c improved from 5.7 to 5.3, fasting glucose from 100 to 95, and insulin  from 19 to 16, indicating improved insulin  resistance. Metformin  is removed from her medication list, and her condition will be monitored through lifestyle modifications. - Remove metformin  from medication list - Continue lifestyle modifications to improve insulin  resistance - Monitor A1c and glucose levels in future lab work  Hyperlipidemia Her LDL cholesterol improved from 127 to 116, but the goal is to achieve levels under 100. Her HDL cholesterol has decreased, possibly due to reduced physical activity. Triglycerides are at a good level (114). Genetic factors and age may contribute to cholesterol levels. Medication is not immediately necessary but may be considered if levels do not improve. - Continue  dietary modifications to improve cholesterol levels - Monitor cholesterol levels in future lab work  Vitamin D  Deficiency Her vitamin D  level improved from 24 to 34, with a goal to reach the 50s. Plans to continue vitamin D  supplementation. - Continue vitamin D  supplementation - Monitor vitamin D  levels in future lab work    She was informed of the importance of frequent follow up visits to maximize her success with intensive lifestyle modifications for her multiple health conditions.    Louann Penton, MD

## 2024-01-27 ENCOUNTER — Telehealth (INDEPENDENT_AMBULATORY_CARE_PROVIDER_SITE_OTHER): Admitting: Psychology

## 2024-01-27 DIAGNOSIS — F5089 Other specified eating disorder: Secondary | ICD-10-CM

## 2024-01-27 NOTE — Progress Notes (Signed)
  Office: [phone]  /  Fax: [phone]    Date: January 27, 2024  Appointment Start Time: 2:26pm Duration: 22 minutes Provider: Wyatt Fire, Psy.D. Type of Session: Individual Therapy  Location of Patient: Work (private location) Location of Provider: Provider's Home (private office) Type of Contact: Telepsychological Visit via MyChart Video Visit  Session Content: Leah Brown is a 43 y.o. female presenting for a follow-up appointment to address the previously established treatment goal of increasing coping skills.Today's appointment was a telepsychological visit. Leah Brown provided verbal consent for today's telepsychological appointment and she is aware she is responsible for securing confidentiality on her end of the session. Prior to proceeding with today's appointment, Leah Brown's physical location at the time of this appointment was obtained as well a phone number she could be reached at in the event of technical difficulties. Leah Brown and this provider participated in today's telepsychological service.   This provider conducted a brief check-in. Leah Brown shared about recent stressors, noting she started implementing discussed strategies for stress management. Explored the impact of journaling on her overall mood and eating habits. Associated thoughts and feelings were also processed. To further assist with stress reduction, psychoeducation provided regarding diaphragmatic breathing and Leah Brown was engaged in an exercise. Her experience was processed. Leah Brown provided verbal consent during today's appointment for this provider to send a handout about diaphragmatic breathing via e-mail. Overall, Leah Brown was receptive to today's appointment as evidenced by openness to sharing, responsiveness to feedback, and willingness to implement discussed strategies .  Mental Status Examination:  Appearance: neat Behavior: appropriate to circumstances Mood: neutral Affect: mood congruent Speech: WNL Eye Contact:  appropriate Psychomotor Activity: WNL Gait: unable to assess Thought Process: linear, logical, and goal directed and no evidence or endorsement of suicidal, homicidal, and self-harm ideation, plan and intent  Thought Content/Perception: no hallucinations, delusions, bizarre thinking or behavior endorsed or observed Orientation: AAOx4 Memory/Concentration: intact Insight: good Judgment: good  Interventions:  Conducted a brief chart review Provided empathic reflections and validation Reviewed content from the previous session Provided positive reinforcement Employed supportive psychotherapy interventions to facilitate reduced distress and to improve coping skills with identified stressors Psychoeducation provided regarding diaphragmatic breathing  DSM-5 Diagnosis(es): F50.89 Other Specified Feeding or Eating Disorder, Emotional and Binge Eating Behaviors  Treatment Goal & Progress: The following treatment goal was established: increase coping skills. Leah Brown has demonstrated progress in her goal as evidenced by increased awareness of hunger patterns, increased awareness of triggers for emotional eating behaviors, and reduction in emotional and binge eating behaviors. Leah Brown also continues to demonstrate willingness to engage in learned skill(s).   Plan: The next appointment is scheduled for 02/11/2024 at 2pm, which will be via MyChart Video Visit. The next session will focus on working towards the established treatment goal and possible termination planning.   Wyatt Fire, PsyD

## 2024-02-11 ENCOUNTER — Telehealth (INDEPENDENT_AMBULATORY_CARE_PROVIDER_SITE_OTHER): Admitting: Psychology

## 2024-02-11 DIAGNOSIS — F5089 Other specified eating disorder: Secondary | ICD-10-CM

## 2024-02-11 NOTE — Progress Notes (Signed)
  Office: [phone]  /  Fax: [phone]    Date: February 11, 2024  Appointment Start Time: 2:04pm Duration: 26 minutes Provider: Wyatt Fire, Psy.D. Type of Session: Individual Therapy  Location of Patient: Work (private location) Location of Provider: Provider's Home (private office) Type of Contact: Telepsychological Visit via MyChart Video Visit  Session Content: Leah Brown is a 43 y.o. female presenting for a follow-up appointment to address the previously established treatment goal of increasing coping skills.Today's appointment was a telepsychological visit. [name] provided verbal consent for today's telepsychological appointment and she is aware she is responsible for securing confidentiality on her end of the session. Prior to proceeding with today's appointment, [name] physical location at the time of this appointment was obtained as well a phone number she could be reached at in the event of technical difficulties. Kirandeep and this provider participated in today's telepsychological service.   This provider conducted a brief check-in. [name] shared she is preparing for her upcoming cruise on Saturday. She also reported implementing discussed strategies and described a reduction in emotional eating behaviors as well as cravings. Psychoeducation regarding making better choices and engaging in portion control during the holidays/celebrations/vacations was provided. More specifically, this provider discussed the following strategies: coming to meals hungry, but not starving; avoid filling up on appetizers; managing portion sizes; not completely depriving yourself; making the plate colorful (e.g., vegetables); pacing yourself (e.g., waiting 10 minutes before going back for seconds); taking advantage of the nutritious foods; practicing mindfulness; staying hydrated; and avoid bringing home leftovers. Furthermore, termination planning was discussed. [name] was receptive to a follow-up appointment in 3-4  weeks and an additional follow-up/termination appointment in 4-5 weeks after that. Overall, [name] was receptive to today's appointment as evidenced by openness to sharing, responsiveness to feedback, and willingness to implement discussed strategies .  Mental Status Examination:  Appearance: neat Behavior: appropriate to circumstances Mood: neutral Affect: mood congruent Speech: WNL Eye Contact: appropriate Psychomotor Activity: WNL Gait: unable to assess Thought Process: linear, logical, and goal directed and no evidence or endorsement of suicidal, homicidal, and self-harm ideation, plan and intent  Thought Content/Perception: no hallucinations, delusions, bizarre thinking or behavior endorsed or observed Orientation: AAOx4 Memory/Concentration: intact Insight: good Judgment: good  Interventions:  Conducted a brief chart review Provided empathic reflections and validation Reviewed content from the previous session Provided positive reinforcement Employed supportive psychotherapy interventions to facilitate reduced distress and to improve coping skills with identified stressors Discussed termination planning Psychoeducation provided regarding strategies for celebrations/holidays/vacations  DSM-5 Diagnosis(es): F50.89 Other Specified Feeding or Eating Disorder, Emotional and Binge Eating Behaviors  Treatment Goal & Progress: The following treatment goal was established: increase coping skills. [name] has demonstrated progress in her goal as evidenced by increased awareness of hunger patterns, increased awareness of triggers for emotional eating behaviors, and reduction in emotional and binge eating behaviors. [name] also continues to demonstrate willingness to engage in learned skill(s).   Plan: The next appointment is scheduled for 03/10/2024 at 2pm, which will be via MyChart Video Visit. The next session will focus on working towards the established treatment goal.   Wyatt Fire,  PsyD

## 2024-02-19 ENCOUNTER — Other Ambulatory Visit (INDEPENDENT_AMBULATORY_CARE_PROVIDER_SITE_OTHER): Payer: Self-pay | Admitting: Family Medicine

## 2024-02-19 DIAGNOSIS — F3289 Other specified depressive episodes: Secondary | ICD-10-CM

## 2024-02-20 ENCOUNTER — Ambulatory Visit (INDEPENDENT_AMBULATORY_CARE_PROVIDER_SITE_OTHER): Admitting: Family Medicine

## 2024-03-04 ENCOUNTER — Encounter (INDEPENDENT_AMBULATORY_CARE_PROVIDER_SITE_OTHER): Payer: Self-pay | Admitting: Family Medicine

## 2024-03-04 ENCOUNTER — Ambulatory Visit (INDEPENDENT_AMBULATORY_CARE_PROVIDER_SITE_OTHER): Admitting: Family Medicine

## 2024-03-04 VITALS — BP 124/70 | HR 77 | Temp 98.2°F | Ht 63.0 in | Wt 217.0 lb

## 2024-03-04 DIAGNOSIS — E66813 Obesity, class 3: Secondary | ICD-10-CM

## 2024-03-04 DIAGNOSIS — F5089 Other specified eating disorder: Secondary | ICD-10-CM

## 2024-03-04 DIAGNOSIS — E669 Obesity, unspecified: Secondary | ICD-10-CM

## 2024-03-04 DIAGNOSIS — E559 Vitamin D deficiency, unspecified: Secondary | ICD-10-CM | POA: Diagnosis not present

## 2024-03-04 DIAGNOSIS — Z6839 Body mass index (BMI) 39.0-39.9, adult: Secondary | ICD-10-CM

## 2024-03-04 DIAGNOSIS — F3289 Other specified depressive episodes: Secondary | ICD-10-CM

## 2024-03-04 DIAGNOSIS — R7303 Prediabetes: Secondary | ICD-10-CM

## 2024-03-04 DIAGNOSIS — Z6838 Body mass index (BMI) 38.0-38.9, adult: Secondary | ICD-10-CM

## 2024-03-04 MED ORDER — BUPROPION HCL ER (XL) 300 MG PO TB24: 300.0000 mg | ORAL_TABLET | Freq: Every day | ORAL | 0 refills | Status: DC

## 2024-03-04 MED ORDER — TOPIRAMATE 25 MG PO TABS
25.0000 mg | ORAL_TABLET | Freq: Every day | ORAL | 0 refills | Status: DC
Start: 2024-03-04 — End: 2024-04-02

## 2024-03-04 MED ORDER — VITAMIN D (ERGOCALCIFEROL) 1.25 MG (50000 UNIT) PO CAPS: 50000.0000 [IU] | ORAL_CAPSULE | ORAL | 0 refills | Status: DC

## 2024-03-04 NOTE — Progress Notes (Signed)
 Office: [phone]  /  Fax: [phone]  WEIGHT SUMMARY AND BIOMETRICS  Anthropometric Measurements Height: 5' 3 (1.6 m) Weight: 217 lb (98.4 kg) BMI (Calculated): 38.45 Weight at Last Visit: 221 lb Weight Lost Since Last Visit: 4 lb Weight Gained Since Last Visit: 0 Starting Weight: 228 lb Total Weight Loss (lbs): 11 lb (4.99 kg) Peak Weight: 235 lb   Body Composition  Body Fat %: 44.1 % Fat Mass (lbs): 96 lbs Muscle Mass (lbs): 115.6 lbs Total Body Water (lbs): 85.4 lbs Visceral Fat Rating : 12   Other Clinical Data Fasting: no Labs: no Today's Visit #: 16 Starting Date: 02/13/23 Comments: cat 2    Chief Complaint: OBESITY    History of Present Illness Leah Brown is a 43 year old female with obesity and vitamin D  deficiency who presents for obesity treatment and progress assessment.  She is following a category two eating plan but adheres to it only about ten percent of the time. She exercises two days a week for forty-five minutes and is attempting to increase her daily steps, currently averaging about six thousand steps per day. Over the last six weeks, she has lost four pounds. Emotional eating behaviors are present, and she is being treated with topiramate  and Wellbutrin . She is also consulting with a psychologist about eating disorders. She requests refills for her Wellbutrin  and topiramate .  Two weeks ago, she contracted COVID-19, which led to a temporary loss of taste and smell. This occurred during a cruise, where she initially worried about gaining weight but ended up losing weight due to her inability to taste food. Her sense of taste is gradually returning, though some foods still taste different.  She is being treated for vitamin D  deficiency with a prescription of fifty thousand international units weekly and requests a refill for this medication.  She discusses challenges with protein intake, noting a preference for carbohydrates over  protein-rich foods like lean meats. She is trying to be more mindful of her eating habits and is exploring ways to incorporate more protein into her diet.      PHYSICAL EXAM:  Blood pressure 124/70, pulse 77, temperature 98.2 F (36.8 C), height 5' 3 (1.6 m), weight 217 lb (98.4 kg), SpO2 98%. Body mass index is 38.44 kg/m.  DIAGNOSTIC DATA REVIEWED:  BMET    Component Value Date/Time   NA 136 12/26/2023 0951   NA 138 06/11/2013 1014   K 4.1 12/26/2023 0951   K 3.8 06/11/2013 1014   CL 103 12/26/2023 0951   CL 102 06/11/2013 1014   CO2 18 (L) 12/26/2023 0951   CO2 27 06/11/2013 1014   GLUCOSE 95 12/26/2023 0951   GLUCOSE 96 06/25/2017 1205   GLUCOSE 96 06/11/2013 1014   BUN 16 12/26/2023 0951   BUN 11 06/11/2013 1014   CREATININE 0.87 12/26/2023 0951   CREATININE 0.72 06/25/2017 1205   CALCIUM 9.1 12/26/2023 0951   CALCIUM 8.7 06/11/2013 1014   GFRNONAA 97 03/05/2018 0835   GFRNONAA 108 06/25/2017 1205   GFRAA 111 03/05/2018 0835   GFRAA 126 06/25/2017 1205   Lab Results  Component Value Date   HGBA1C 5.3 12/26/2023   HGBA1C 5.2 10/31/2017   Lab Results  Component Value Date   INSULIN  16.8 12/26/2023   INSULIN  20.2 10/31/2017   Lab Results  Component Value Date   TSH 1.630 02/13/2023   CBC    Component Value Date/Time   WBC 7.1 02/13/2023 0859   WBC 8.4  06/25/2017 1205   RBC 4.61 02/13/2023 0859   RBC 4.51 06/25/2017 1205   HGB 14.0 02/13/2023 0859   HCT 42.7 02/13/2023 0859   PLT 289 02/13/2023 0859   MCV 93 02/13/2023 0859   MCV 91 06/11/2013 1014   MCH 30.4 02/13/2023 0859   MCH 30.4 06/25/2017 1205   MCHC 32.8 02/13/2023 0859   MCHC 33.7 06/25/2017 1205   RDW 13.2 02/13/2023 0859   RDW 12.5 06/11/2013 1014   Iron Studies No results found for: IRON, TIBC, FERRITIN, IRONPCTSAT Lipid Panel     Component Value Date/Time   CHOL 175 12/26/2023 0951   CHOL 175 06/11/2013 1014   TRIG 114 12/26/2023 0951   TRIG 52 06/11/2013 1014    HDL 38 (L) 12/26/2023 0951   HDL 42 06/11/2013 1014   CHOLHDL 3.5 12/13/2016 0902   VLDL 13 12/13/2016 0902   VLDL 10 06/11/2013 1014   LDLCALC 116 (H) 12/26/2023 0951   LDLCALC 123 (H) 06/11/2013 1014   Hepatic Function Panel     Component Value Date/Time   PROT 6.3 12/26/2023 0951   PROT 7.3 06/11/2013 1014   ALBUMIN 4.1 12/26/2023 0951   ALBUMIN 3.9 06/11/2013 1014   AST 17 12/26/2023 0951   AST 13 (L) 06/11/2013 1014   ALT 16 12/26/2023 0951   ALT 21 06/11/2013 1014   ALKPHOS 76 12/26/2023 0951   ALKPHOS 52 06/11/2013 1014   BILITOT 0.4 12/26/2023 0951   BILITOT 0.4 06/11/2013 1014      Component Value Date/Time   TSH 1.630 02/13/2023 0859   Nutritional Lab Results  Component Value Date   VD25OH 34.8 12/26/2023   VD25OH 24.1 (L) 02/13/2023   VD25OH 39.0 03/05/2018     Assessment and Plan Assessment & Plan Obesity with emotional eating behaviors Following the category two eating plan only 10% of the time. Exercising two days a week for 45 minutes and increasing steps most days. Lost 4 pounds in the last 6 weeks. Emotional eating behaviors managed with topiramate  and Wellbutrin . Discussed challenges with protein intake and provided recipes to incorporate more protein without large meat portions. Discussed the impact of COVID on taste and appetite, and strategies to increase daily activity. Wellbutrin  SR is preferred for weight loss and smoking cessation, but she prefers to switch to Wellbutrin  XL due to adherence issues with twice-daily dosing. Discussed potential side effects of Wellbutrin , including diarrhea, and noted that extended-release formulations may reduce GI side effects. - Refill topiramate . - Switch Wellbutrin  SR to Wellbutrin  XL 300 mg once daily. - Provide recipes for protein-rich meals and snacks. - Encourage continued exercise and increasing daily steps.  Prediabetes She is working on lifestyle modifications to treat her prediabetes - Continue diet,  exercise and weight loss as discussed today as an important part of the treatment plan. Will continue to monitor her progress closely  Vitamin D  deficiency Vitamin D  deficiency being treated with prescription vitamin D  50,000 IU weekly. - Refill vitamin D  50,000 IU weekly.     She was informed of the importance of frequent follow up visits to maximize her success with intensive lifestyle modifications for her multiple health conditions.    Louann Penton, MD

## 2024-03-10 ENCOUNTER — Telehealth (INDEPENDENT_AMBULATORY_CARE_PROVIDER_SITE_OTHER): Admitting: Psychology

## 2024-04-01 ENCOUNTER — Ambulatory Visit (INDEPENDENT_AMBULATORY_CARE_PROVIDER_SITE_OTHER): Admitting: Family Medicine

## 2024-04-02 ENCOUNTER — Encounter: Payer: Self-pay | Admitting: Internal Medicine

## 2024-04-02 ENCOUNTER — Ambulatory Visit: Admitting: Internal Medicine

## 2024-04-02 VITALS — BP 122/60 | HR 67 | Ht 63.0 in | Wt 222.0 lb

## 2024-04-02 DIAGNOSIS — Z6839 Body mass index (BMI) 39.0-39.9, adult: Secondary | ICD-10-CM | POA: Diagnosis not present

## 2024-04-02 DIAGNOSIS — Z1231 Encounter for screening mammogram for malignant neoplasm of breast: Secondary | ICD-10-CM | POA: Diagnosis not present

## 2024-04-02 DIAGNOSIS — F3341 Major depressive disorder, recurrent, in partial remission: Secondary | ICD-10-CM | POA: Diagnosis not present

## 2024-04-02 MED ORDER — BUPROPION HCL ER (SR) 150 MG PO TB12: 150.0000 mg | ORAL_TABLET | Freq: Two times a day (BID) | ORAL | 1 refills | Status: AC

## 2024-04-02 MED ORDER — CITALOPRAM HYDROBROMIDE 10 MG PO TABS: 10.0000 mg | ORAL_TABLET | Freq: Every day | ORAL | 1 refills | Status: AC

## 2024-04-02 MED ORDER — TRAZODONE HCL 50 MG PO TABS
50.0000 mg | ORAL_TABLET | Freq: Every day | ORAL | 0 refills | Status: AC
Start: 2024-04-02 — End: ?

## 2024-04-02 NOTE — Progress Notes (Signed)
 Date:  04/02/2024   Name:  Leah Brown   DOB:  1981-04-23   MRN:  [id number]   Chief Complaint: Depression and Anxiety (Wants to switch back to Wellbutrin  HCL 150 mg BID. Currently taking Wellbutrin  300 mg XL Daily and it does not seem to be helping as much. )  Depression        This is a chronic (previously on Celexa  and Bupropion  bid) problem.  The problem has been gradually worsening (since stopping Celexa  and changes Bupropion  to XL 300 mg) since onset.  Associated symptoms include no fatigue and no suicidal ideas.     The symptoms are aggravated by social issues and work stress. Weight management - has been going to healthy weight and wellness - changed to XL bupropion  and added Topiramate  but without any weight loss benefit.  She is likely going to stop going to those appointments.  Her insurance does not cover GLP-1 medications for weight loss.   Review of Systems  Constitutional:  Negative for chills, fatigue and unexpected weight change.  Respiratory:  Negative for chest tightness and shortness of breath.   Cardiovascular:  Negative for chest pain.  Neurological:  Negative for dizziness and light-headedness.  Psychiatric/Behavioral:  Positive for depression and dysphoric mood. Negative for sleep disturbance and suicidal ideas. The patient is nervous/anxious.      Lab Results  Component Value Date   NA 136 12/26/2023   K 4.1 12/26/2023   CO2 18 (L) 12/26/2023   GLUCOSE 95 12/26/2023   BUN 16 12/26/2023   CREATININE 0.87 12/26/2023   CALCIUM 9.1 12/26/2023   EGFR 85 12/26/2023   GFRNONAA 97 03/05/2018   Lab Results  Component Value Date   CHOL 175 12/26/2023   HDL 38 (L) 12/26/2023   LDLCALC 116 (H) 12/26/2023   TRIG 114 12/26/2023   CHOLHDL 3.5 12/13/2016   Lab Results  Component Value Date   TSH 1.630 02/13/2023   Lab Results  Component Value Date   HGBA1C 5.3 12/26/2023   Lab Results  Component Value Date   WBC 7.1 02/13/2023   HGB 14.0  02/13/2023   HCT 42.7 02/13/2023   MCV 93 02/13/2023   PLT 289 02/13/2023   Lab Results  Component Value Date   ALT 16 12/26/2023   AST 17 12/26/2023   ALKPHOS 76 12/26/2023   BILITOT 0.4 12/26/2023   Lab Results  Component Value Date   VD25OH 34.8 12/26/2023     Patient Active Problem List   Diagnosis Date Noted   BMI 39.0-39.9,adult 03/13/2023   BRCA gene mutation negative in female 03/06/2023   PCOS (polycystic ovarian syndrome) 03/06/2023   Gastroesophageal reflux disease 02/26/2022   Recurrent major depression in partial remission 11/18/2017   Vitamin D  deficiency 12/13/2016   Other seasonal allergic rhinitis 12/03/2015   Family hx of ovarian malignancy 12/03/2015    Allergies  Allergen Reactions   Almond (Diagnostic) Hives and Swelling   Cat Dander Itching   Celery Oil Swelling   Codeine Nausea Only   Mixed Feathers Itching   Peanut (Diagnostic) Other (See Comments)    Allergy testing   Peanut-Containing Drug Products Other (See Comments)    Allergy testing    Pollen Extract Swelling   Tree Extract Hives and Swelling    Past Surgical History:  Procedure Laterality Date   CHOLECYSTECTOMY, LAPAROSCOPIC  02/24/2021   SKIN CANCER DESTRUCTION Left 2008   Breast   WISDOM TOOTH EXTRACTION  Social History   Tobacco Use   Smoking status: Former    Current packs/day: 0.00    Average packs/day: 0.5 packs/day for 4.0 years (2.0 ttl pk-yrs)    Types: Cigarettes    Start date: 07/10/1999    Quit date: 07/10/2003    Years since quitting: 20.7   Smokeless tobacco: Never  Vaping Use   Vaping status: Never Used  Substance Use Topics   Alcohol use: Yes    Alcohol/week: 2.0 standard drinks of alcohol    Types: 1 Glasses of wine, 1 Cans of beer per week    Comment: socially   Drug use: No     Medication list has been reviewed and updated.  Current Meds  Medication Sig   Bacillus Coagulans-Inulin (PROBIOTIC-PREBIOTIC PO) Take by mouth daily.   buPROPion   (WELLBUTRIN  SR) 150 MG 12 hr tablet Take 1 tablet (150 mg total) by mouth 2 (two) times daily.   caffeine 200 MG TABS tablet Take 100 mg by mouth daily as needed.   cetirizine (ZYRTEC) 10 MG tablet Take 10 mg by mouth as needed.    citalopram  (CELEXA ) 10 MG tablet Take 1 tablet (10 mg total) by mouth daily.   Multiple Vitamin (MULTIVITAMIN WITH MINERALS) TABS tablet Take 1 tablet by mouth daily.   scopolamine  (TRANSDERM SCOP , 1.5 MG,) 1 MG/3DAYS Place 1 patch (1.5 mg total) onto the skin every 3 (three) days.   Vitamin D , Ergocalciferol , (DRISDOL ) 1.25 MG (50000 UNIT) CAPS capsule Take 1 capsule (50,000 Units total) by mouth every 7 (seven) days.   [DISCONTINUED] buPROPion  (WELLBUTRIN  XL) 300 MG 24 hr tablet Take 1 tablet (300 mg total) by mouth daily.   [DISCONTINUED] topiramate  (TOPAMAX ) 25 MG tablet Take 1 tablet (25 mg total) by mouth at bedtime.   [DISCONTINUED] traZODone  (DESYREL ) 50 MG tablet Take 50 mg by mouth at bedtime.   [DISCONTINUED] TRI-LO-MARZIA 0.18/0.215/0.25 MG-25 MCG TABS Take 1 tablet by mouth every morning.       04/02/2024   10:39 AM 09/20/2023   10:27 AM 07/17/2023    2:47 PM 03/06/2023    2:53 PM  GAD 7 : Generalized Anxiety Score  Nervous, Anxious, on Edge 3 1 0 1  Control/stop worrying 3 0 0 0  Worry too much - different things 3 0 0 0  Trouble relaxing 2 0 0 0  Restless 0 0 0 0  Easily annoyed or irritable 1 0 0 0  Afraid - awful might happen 0 1 0 0  Total GAD 7 Score 12 2 0 1  Anxiety Difficulty Very difficult  Not difficult at all Not difficult at all       04/02/2024   10:39 AM 09/20/2023   10:27 AM 07/17/2023    2:47 PM  Depression screen PHQ 2/9  Decreased Interest 0 1 0  Down, Depressed, Hopeless 0 0 0  PHQ - 2 Score 0 1 0  Altered sleeping 1 1   Tired, decreased energy 1 2   Change in appetite 0 0   Feeling bad or failure about yourself  0 0   Trouble concentrating 1 1   Moving slowly or fidgety/restless 0 0   Suicidal thoughts 0 0   PHQ-9  Score 3 5   Difficult doing work/chores Not difficult at all      BP Readings from Last 3 Encounters:  04/02/24 122/60  03/04/24 124/70  01/23/24 118/68    Physical Exam Vitals and nursing note reviewed.  Constitutional:  General: She is not in acute distress.    Appearance: Normal appearance. She is well-developed.  HENT:     Head: Normocephalic and atraumatic.  Cardiovascular:     Rate and Rhythm: Normal rate and regular rhythm.  Pulmonary:     Effort: Pulmonary effort is normal. No respiratory distress.     Breath sounds: No wheezing or rhonchi.  Musculoskeletal:     Cervical back: Normal range of motion.     Right lower leg: No edema.     Left lower leg: No edema.  Lymphadenopathy:     Cervical: No cervical adenopathy.  Skin:    General: Skin is warm and dry.     Findings: No rash.  Neurological:     General: No focal deficit present.     Mental Status: She is alert and oriented to person, place, and time.  Psychiatric:        Mood and Affect: Mood normal.        Behavior: Behavior normal.     Wt Readings from Last 3 Encounters:  04/02/24 222 lb (100.7 kg)  03/04/24 217 lb (98.4 kg)  01/23/24 221 lb (100.2 kg)    BP 122/60   Pulse 67   Ht 5' 3 (1.6 m)   Wt 222 lb (100.7 kg)   SpO2 99%   BMI 39.33 kg/m   Assessment and Plan:  Problem List Items Addressed This Visit       Unprioritized   BMI 39.0-39.9,adult   No longer going the [name]. Continue diet and exercise.      Recurrent major depression in partial remission - Primary   Symptoms slowly worsening - she felt that she was doing well when on Celexa  10 mg with bupropion  SR 150 mg bid. She would like to resume that regimen. Recommend follow up if not improving - otherwise in 4 months for CPX      Relevant Medications   buPROPion  (WELLBUTRIN  SR) 150 MG 12 hr tablet   citalopram  (CELEXA ) 10 MG tablet   traZODone  (DESYREL ) 50 MG tablet   Other Visit Diagnoses       Encounter for  screening mammogram for breast cancer       Relevant Orders   MM 3D SCREENING MAMMOGRAM BILATERAL BREAST       Return in about 4 months (around 08/02/2024) for [facility] CPX  Dr. Lemon.    Leita HILARIO Adie, MD [facility] [place]

## 2024-04-02 NOTE — Assessment & Plan Note (Signed)
 No longer going the [name]. Continue diet and exercise.

## 2024-04-02 NOTE — Assessment & Plan Note (Signed)
 Symptoms slowly worsening - she felt that she was doing well when on Celexa  10 mg with bupropion  SR 150 mg bid. She would like to resume that regimen. Recommend follow up if not improving - otherwise in 4 months for CPX

## 2024-04-20 ENCOUNTER — Ambulatory Visit: Admitting: Internal Medicine

## 2024-04-22 ENCOUNTER — Ambulatory Visit: Admitting: Internal Medicine

## 2024-04-22 VITALS — BP 128/78 | HR 83 | Ht 63.0 in | Wt 218.4 lb

## 2024-04-22 DIAGNOSIS — Z6838 Body mass index (BMI) 38.0-38.9, adult: Secondary | ICD-10-CM

## 2024-04-22 DIAGNOSIS — F3341 Major depressive disorder, recurrent, in partial remission: Secondary | ICD-10-CM | POA: Diagnosis not present

## 2024-04-22 MED ORDER — TIRZEPATIDE-WEIGHT MANAGEMENT 2.5 MG/0.5ML ~~LOC~~ SOLN: 2.5000 mg | SUBCUTANEOUS | 0 refills | Status: DC

## 2024-04-22 NOTE — Assessment & Plan Note (Signed)
 Begin Zepbound through [facility].  2.5 mg weekly x 4 then call for the next dose of 5 mg. Follow up in 2 months. Continue low carb and low fat diet choices; regular exercise.

## 2024-04-22 NOTE — Assessment & Plan Note (Signed)
 Depression and anxiety symptoms are improved on Bupropion  with the addition of Citalopram . No SI/HI reported. I recommend continuing the same medical regimen.

## 2024-04-22 NOTE — Progress Notes (Signed)
 Date:  04/22/2024   Name:  Leah Brown   DOB:  [birth year]   MRN:  [id number]   Chief Complaint: Weight Check (Wants to discuss weight loss injections. Pt tried Saxenda in the past, and it helped until it caused her to pass gallstones. Had gallbladder removed. Pt interested in Zepbound and wants sent to [facility].)  Depression        This is a chronic problem.  The problem has been gradually improving since onset.  Associated symptoms include no fatigue, no myalgias and no headaches.  Past treatments include SSRIs - Selective serotonin reuptake inhibitors (and bupropion .  Citalopram  added last month).  Compliance with treatment is good.  Previous treatment provided moderate relief. Obesity - she wants to try Zepbound from [facility].  She has used Saxenda in the past with good results but stopped it after she had gall stones and required surgery.  Review of Systems  Constitutional:  Negative for chills, fatigue and fever.  Respiratory:  Negative for chest tightness and shortness of breath.   Cardiovascular:  Negative for chest pain.  Gastrointestinal:  Negative for abdominal distention, abdominal pain, constipation and diarrhea.  Musculoskeletal:  Negative for arthralgias and myalgias.  Neurological:  Negative for dizziness and headaches.  Psychiatric/Behavioral:  Positive for depression and dysphoric mood. Negative for sleep disturbance. The patient is not nervous/anxious.      Lab Results  Component Value Date   NA 136 12/26/2023   K 4.1 12/26/2023   CO2 18 (L) 12/26/2023   GLUCOSE 95 12/26/2023   BUN 16 12/26/2023   CREATININE 0.87 12/26/2023   CALCIUM 9.1 12/26/2023   EGFR 85 12/26/2023   GFRNONAA 97 03/05/2018   Lab Results  Component Value Date   CHOL 175 12/26/2023   HDL 38 (L) 12/26/2023   LDLCALC 116 (H) 12/26/2023   TRIG 114 12/26/2023   CHOLHDL 3.5 12/13/2016   Lab Results  Component Value Date   TSH 1.630 02/13/2023   Lab Results   Component Value Date   HGBA1C 5.3 12/26/2023   Lab Results  Component Value Date   WBC 7.1 02/13/2023   HGB 14.0 02/13/2023   HCT 42.7 02/13/2023   MCV 93 02/13/2023   PLT 289 02/13/2023   Lab Results  Component Value Date   ALT 16 12/26/2023   AST 17 12/26/2023   ALKPHOS 76 12/26/2023   BILITOT 0.4 12/26/2023   Lab Results  Component Value Date   VD25OH 34.8 12/26/2023     Patient Active Problem List   Diagnosis Date Noted   BMI 38.0-38.9,adult 03/13/2023   BRCA gene mutation negative in female 03/06/2023   PCOS (polycystic ovarian syndrome) 03/06/2023   Gastroesophageal reflux disease 02/26/2022   Recurrent major depression in partial remission 11/18/2017   Vitamin D  deficiency 12/13/2016   Other seasonal allergic rhinitis 12/03/2015   Family hx of ovarian malignancy 12/03/2015    Allergies  Allergen Reactions   Almond (Diagnostic) Hives and Swelling   Cat Dander Itching   Celery Oil Swelling   Codeine Nausea Only   Mixed Feathers Itching   Peanut (Diagnostic) Other (See Comments)    Allergy testing   Peanut-Containing Drug Products Other (See Comments)    Allergy testing    Pollen Extract Swelling   Tree Extract Hives and Swelling    Past Surgical History:  Procedure Laterality Date   CHOLECYSTECTOMY, LAPAROSCOPIC  02/24/2021   SKIN CANCER DESTRUCTION Left 2008   Breast   WISDOM TOOTH  EXTRACTION      Social History   Tobacco Use   Smoking status: Former    Current packs/day: 0.00    Average packs/day: 0.5 packs/day for 4.0 years (2.0 ttl pk-yrs)    Types: Cigarettes    Start date: 07/10/1999    Quit date: 07/10/2003    Years since quitting: 20.8   Smokeless tobacco: Never  Vaping Use   Vaping status: Never Used  Substance Use Topics   Alcohol use: Yes    Alcohol/week: 2.0 standard drinks of alcohol    Types: 1 Glasses of wine, 1 Cans of beer per week    Comment: socially   Drug use: No     Medication list has been reviewed and  updated.  Current Meds  Medication Sig   Bacillus Coagulans-Inulin (PROBIOTIC-PREBIOTIC PO) Take by mouth daily.   buPROPion  (WELLBUTRIN  SR) 150 MG 12 hr tablet Take 1 tablet (150 mg total) by mouth 2 (two) times daily.   caffeine 200 MG TABS tablet Take 100 mg by mouth daily as needed.   cetirizine (ZYRTEC) 10 MG tablet Take 10 mg by mouth as needed.    citalopram  (CELEXA ) 10 MG tablet Take 1 tablet (10 mg total) by mouth daily.   Multiple Vitamin (MULTIVITAMIN WITH MINERALS) TABS tablet Take 1 tablet by mouth daily.   tirzepatide (ZEPBOUND) 2.5 MG/0.5ML injection vial Inject 2.5 mg into the skin once a week.   traZODone  (DESYREL ) 50 MG tablet Take 1 tablet (50 mg total) by mouth at bedtime.   Vitamin D , Ergocalciferol , (DRISDOL ) 1.25 MG (50000 UNIT) CAPS capsule Take 1 capsule (50,000 Units total) by mouth every 7 (seven) days.       04/22/2024    3:30 PM 04/02/2024   10:39 AM 09/20/2023   10:27 AM 07/17/2023    2:47 PM  GAD 7 : Generalized Anxiety Score  Nervous, Anxious, on Edge 2 3 1  0  Control/stop worrying 2 3 0 0  Worry too much - different things 2 3 0 0  Trouble relaxing 2 2 0 0  Restless 0 0 0 0  Easily annoyed or irritable 1 1 0 0  Afraid - awful might happen 0 0 1 0  Total GAD 7 Score 9 12 2  0  Anxiety Difficulty Very difficult Very difficult  Not difficult at all       04/22/2024    3:30 PM 04/02/2024   10:39 AM 09/20/2023   10:27 AM  Depression screen PHQ 2/9  Decreased Interest 0 0 1  Down, Depressed, Hopeless 0 0 0  PHQ - 2 Score 0 0 1  Altered sleeping 0 1 1  Tired, decreased energy 0 1 2  Change in appetite 0 0 0  Feeling bad or failure about yourself  0 0 0  Trouble concentrating 0 1 1  Moving slowly or fidgety/restless 0 0 0  Suicidal thoughts 0 0 0  PHQ-9 Score 0 3 5  Difficult doing work/chores Not difficult at all Not difficult at all     BP Readings from Last 3 Encounters:  04/22/24 128/78  04/02/24 122/60  03/04/24 124/70    Physical  Exam Vitals and nursing note reviewed.  Constitutional:      General: She is not in acute distress.    Appearance: Normal appearance. She is well-developed.  HENT:     Head: Normocephalic and atraumatic.  Cardiovascular:     Rate and Rhythm: Normal rate and regular rhythm.  Pulmonary:  Effort: Pulmonary effort is normal. No respiratory distress.     Breath sounds: No wheezing or rhonchi.  Musculoskeletal:     Cervical back: Normal range of motion.  Lymphadenopathy:     Cervical: No cervical adenopathy.  Skin:    General: Skin is warm and dry.     Findings: No rash.  Neurological:     Mental Status: She is alert and oriented to person, place, and time.  Psychiatric:        Mood and Affect: Mood normal.        Behavior: Behavior normal.     Wt Readings from Last 3 Encounters:  04/22/24 218 lb 6.4 oz (99.1 kg)  04/02/24 222 lb (100.7 kg)  03/04/24 217 lb (98.4 kg)    BP 128/78   Pulse 83   Ht 5' 3 (1.6 m)   Wt 218 lb 6.4 oz (99.1 kg)   SpO2 98%   BMI 38.69 kg/m   Assessment and Plan:  Problem List Items Addressed This Visit       Unprioritized   Recurrent major depression in partial remission - Primary (Chronic)   Depression and anxiety symptoms are improved on Bupropion  with the addition of Citalopram . No SI/HI reported. I recommend continuing the same medical regimen.       BMI 38.0-38.9,adult   Begin Zepbound through [employer].  2.5 mg weekly x 4 then call for the next dose of 5 mg. Follow up in 2 months. Continue low carb and low fat diet choices; regular exercise.      Relevant Medications   tirzepatide (ZEPBOUND) 2.5 MG/0.5ML injection vial    Return in about 2 months (around 06/22/2024) for weight follow up.    Leita HILARIO Adie, MD [facility] [place]

## 2024-04-30 ENCOUNTER — Ambulatory Visit (INDEPENDENT_AMBULATORY_CARE_PROVIDER_SITE_OTHER): Admitting: Family Medicine

## 2024-05-13 ENCOUNTER — Other Ambulatory Visit: Payer: Self-pay | Admitting: Internal Medicine

## 2024-05-13 DIAGNOSIS — Z6838 Body mass index (BMI) 38.0-38.9, adult: Secondary | ICD-10-CM

## 2024-05-14 ENCOUNTER — Encounter (INDEPENDENT_AMBULATORY_CARE_PROVIDER_SITE_OTHER): Payer: Self-pay | Admitting: Family Medicine

## 2024-05-14 ENCOUNTER — Ambulatory Visit (INDEPENDENT_AMBULATORY_CARE_PROVIDER_SITE_OTHER): Payer: Self-pay | Admitting: Family Medicine

## 2024-05-14 VITALS — BP 107/64 | HR 71 | Temp 98.6°F | Ht 63.0 in | Wt 210.0 lb

## 2024-05-14 DIAGNOSIS — E559 Vitamin D deficiency, unspecified: Secondary | ICD-10-CM

## 2024-05-14 DIAGNOSIS — E669 Obesity, unspecified: Secondary | ICD-10-CM

## 2024-05-14 DIAGNOSIS — Z6837 Body mass index (BMI) 37.0-37.9, adult: Secondary | ICD-10-CM | POA: Diagnosis not present

## 2024-05-14 DIAGNOSIS — Z9189 Other specified personal risk factors, not elsewhere classified: Secondary | ICD-10-CM | POA: Diagnosis not present

## 2024-05-14 MED ORDER — VITAMIN D (ERGOCALCIFEROL) 1.25 MG (50000 UNIT) PO CAPS: 50000.0000 [IU] | ORAL_CAPSULE | ORAL | 0 refills | Status: DC

## 2024-05-14 NOTE — Progress Notes (Signed)
 Office: [phone]  /  Fax: [phone]  WEIGHT SUMMARY AND BIOMETRICS  Anthropometric Measurements Height: 5' 3 (1.6 m) Weight: 210 lb (95.3 kg) BMI (Calculated): 37.21 Weight at Last Visit: 217 lb Weight Lost Since Last Visit: 7 lb Weight Gained Since Last Visit: 0 Starting Weight: 228 lb Total Weight Loss (lbs): 18 lb (8.165 kg) Peak Weight: 235 lb   Body Composition  Body Fat %: 44.6 % Fat Mass (lbs): 94 lbs Muscle Mass (lbs): 110.8 lbs Total Body Water (lbs): 84.8 lbs Visceral Fat Rating : 11   Other Clinical Data Fasting: no Labs: no Today's Visit #: 18 Starting Date: 02/13/23    Chief Complaint: OBESITY    History of Present Illness Leah Brown is a 43 year old female who presents for obesity treatment and progress assessment.  She is intermittently following a category three eating plan but is not consuming the recommended amount of protein, fruits, and vegetables. She hydrates well and sleeps seven to nine hours per night. Her ability to exercise has been impacted by a recent back injury, although she had been walking for exercise previously.  Over the past two and a half months, she has lost seven pounds. She has been using Zepbound for two weeks, which has helped her feel full and contributed to her weight loss without any side effects. She consumes between 1200 to 1700 calories daily, focusing on increasing her protein intake, although reaching 150 grams of protein per day is challenging. She logs her food intake daily.  She is being treated for vitamin D  deficiency with a prescription and requests a refill. She is due for lab work soon to monitor her vitamin D  levels.  She is currently on a six-day course of prednisone and a muscle relaxer due to her back injury and is concerned about potential weight gain from prednisone. She plans to start physical therapy next week.      PHYSICAL EXAM:  Blood pressure 107/64, pulse 71, temperature  98.6 F (37 C), height 5' 3 (1.6 m), weight 210 lb (95.3 kg), SpO2 99%. Body mass index is 37.2 kg/m.  DIAGNOSTIC DATA REVIEWED:  BMET    Component Value Date/Time   NA 136 12/26/2023 0951   NA 138 06/11/2013 1014   K 4.1 12/26/2023 0951   K 3.8 06/11/2013 1014   CL 103 12/26/2023 0951   CL 102 06/11/2013 1014   CO2 18 (L) 12/26/2023 0951   CO2 27 06/11/2013 1014   GLUCOSE 95 12/26/2023 0951   GLUCOSE 96 06/25/2017 1205   GLUCOSE 96 06/11/2013 1014   BUN 16 12/26/2023 0951   BUN 11 06/11/2013 1014   CREATININE 0.87 12/26/2023 0951   CREATININE 0.72 06/25/2017 1205   CALCIUM 9.1 12/26/2023 0951   CALCIUM 8.7 06/11/2013 1014   GFRNONAA 97 03/05/2018 0835   GFRNONAA 108 06/25/2017 1205   GFRAA 111 03/05/2018 0835   GFRAA 126 06/25/2017 1205   Lab Results  Component Value Date   HGBA1C 5.3 12/26/2023   HGBA1C 5.2 10/31/2017   Lab Results  Component Value Date   INSULIN  16.8 12/26/2023   INSULIN  20.2 10/31/2017   Lab Results  Component Value Date   TSH 1.630 02/13/2023   CBC    Component Value Date/Time   WBC 7.1 02/13/2023 0859   WBC 8.4 06/25/2017 1205   RBC 4.61 02/13/2023 0859   RBC 4.51 06/25/2017 1205   HGB 14.0 02/13/2023 0859   HCT 42.7 02/13/2023 0859   PLT  289 02/13/2023 0859   MCV 93 02/13/2023 0859   MCV 91 06/11/2013 1014   MCH 30.4 02/13/2023 0859   MCH 30.4 06/25/2017 1205   MCHC 32.8 02/13/2023 0859   MCHC 33.7 06/25/2017 1205   RDW 13.2 02/13/2023 0859   RDW 12.5 06/11/2013 1014   Iron Studies No results found for: IRON, TIBC, FERRITIN, IRONPCTSAT Lipid Panel     Component Value Date/Time   CHOL 175 12/26/2023 0951   CHOL 175 06/11/2013 1014   TRIG 114 12/26/2023 0951   TRIG 52 06/11/2013 1014   HDL 38 (L) 12/26/2023 0951   HDL 42 06/11/2013 1014   CHOLHDL 3.5 12/13/2016 0902   VLDL 13 12/13/2016 0902   VLDL 10 06/11/2013 1014   LDLCALC 116 (H) 12/26/2023 0951   LDLCALC 123 (H) 06/11/2013 1014   Hepatic Function  Panel     Component Value Date/Time   PROT 6.3 12/26/2023 0951   PROT 7.3 06/11/2013 1014   ALBUMIN 4.1 12/26/2023 0951   ALBUMIN 3.9 06/11/2013 1014   AST 17 12/26/2023 0951   AST 13 (L) 06/11/2013 1014   ALT 16 12/26/2023 0951   ALT 21 06/11/2013 1014   ALKPHOS 76 12/26/2023 0951   ALKPHOS 52 06/11/2013 1014   BILITOT 0.4 12/26/2023 0951   BILITOT 0.4 06/11/2013 1014      Component Value Date/Time   TSH 1.630 02/13/2023 0859   Nutritional Lab Results  Component Value Date   VD25OH 34.8 12/26/2023   VD25OH 24.1 (L) 02/13/2023   VD25OH 39.0 03/05/2018     Assessment and Plan Assessment & Plan Obesity management with anti-obesity pharmacotherapy and dietary intervention Obesity management is ongoing with Zepbound, which she self-prescribed due to insurance coverage issues. She reports significant weight loss and increased satiety without side effects. Concerns about prednisone use for back pain potentially affecting weight and insulin  resistance were discussed. Emphasis on maintaining adequate nutrition, particularly protein intake, to prevent muscle loss and metabolic slowdown. She is logging food intake and maintaining a caloric range of 1200-1700 calories per day, with a focus on protein intake of 100 grams per day. Discussed the importance of not increasing Zepbound dose unnecessarily and the potential for sarcopenia if protein intake is inadequate. - Continue Zepbound as prescribed. - Maintain caloric intake between 1200-1700 calories per day. - Ensure protein intake of 100 grams per day. - Consider using protein pudding recipe to increase protein intake. - Monitor weight and nutritional intake regularly.  Vitamin D  deficiency on supplementation, monitoring pending labs Vitamin D  deficiency is being managed with prescription Zepbound 50,000 IU weekly. She is due for lab work to monitor vitamin D  levels. - Ordered vitamin D  level test during next fasting  appointment.  Sarcopenia risk in context of weight loss Risk of sarcopenia due to inadequate protein intake and weight loss. Emphasis on the importance of protein intake to prevent muscle loss and maintain metabolism. Discussed the potential for muscle breakdown if protein intake is insufficient, leading to decreased metabolism and potential weight regain. - Increase protein intake to 100 grams per day. - Incorporate protein-rich foods and snacks into daily diet. - Monitor muscle mass and metabolic rate.     Kiandria was counseled on the importance of maintaining healthy lifestyle habits, including balanced nutrition, regular physical activity, and behavioral modifications, while taking antiobesity medication.  Patient verbalized understanding that medication is an adjunct to, not a replacement for, lifestyle changes and that the long-term success and weight maintenance depend on continued adherence  to these strategies.   Rozetta was informed of the importance of frequent follow up visits to maximize her success with intensive lifestyle modifications for her obesity and obesity related health conditions as recommended by USPSTF and CMS guidelines   Louann Penton, MD

## 2024-05-14 NOTE — Telephone Encounter (Signed)
 Requested medications are due for refill today.  yes  Requested medications are on the active medications list.  yes  Last refill. 04/22/2024 2mL 0 rf  Future visit scheduled.   yes  Notes to clinic.  No protocol for this medication. Please review for refill.    Requested Prescriptions  Pending Prescriptions Disp Refills   ZEPBOUND 2.5 MG/0.5ML injection vial [Pharmacy Med Name: ZEPBOUND 2.5 MG/0.5ML SUBCUTANEOUS SOLUTION] 2 mL 0    Sig: INJECT 0.5 ML (2.5 MG) UNDER THE SKIN ONCE WEEKLY (0.5ML= 50 UNITS)     Off-Protocol Failed - 05/14/2024  4:42 PM      Failed - Medication not assigned to a protocol, review manually.      Passed - Valid encounter within last 12 months    Recent Outpatient Visits           3 weeks ago Recurrent major depression in partial remission   [place] [facility] at [facility], Leita DEL, MD   1 month ago Recurrent major depressive disorder, in partial remission   [place] [facility] at [facility], Leita DEL, MD   7 months ago Sacroiliac joint pain   [facility] at [facility], Leita DEL, MD

## 2024-05-18 ENCOUNTER — Encounter: Payer: Self-pay | Admitting: Internal Medicine

## 2024-06-05 ENCOUNTER — Other Ambulatory Visit: Payer: Self-pay | Admitting: Internal Medicine

## 2024-06-05 DIAGNOSIS — Z6838 Body mass index (BMI) 38.0-38.9, adult: Secondary | ICD-10-CM

## 2024-06-08 ENCOUNTER — Encounter: Payer: Self-pay | Admitting: Internal Medicine

## 2024-06-08 ENCOUNTER — Other Ambulatory Visit: Payer: Self-pay | Admitting: Internal Medicine

## 2024-06-08 DIAGNOSIS — Z6838 Body mass index (BMI) 38.0-38.9, adult: Secondary | ICD-10-CM

## 2024-06-08 MED ORDER — TIRZEPATIDE-WEIGHT MANAGEMENT 5 MG/0.5ML ~~LOC~~ SOLN: 5.0000 mg | SUBCUTANEOUS | 0 refills | Status: DC

## 2024-06-08 NOTE — Telephone Encounter (Signed)
 Please review.  KP

## 2024-06-08 NOTE — Progress Notes (Unsigned)
 Date:  06/08/2024   Name:  Leah Brown   DOB:  [birth year]   MRN:  [id number]   Chief Complaint: No chief complaint on file.  HPI  Review of Systems   Lab Results  Component Value Date   NA 136 12/26/2023   K 4.1 12/26/2023   CO2 18 (L) 12/26/2023   GLUCOSE 95 12/26/2023   BUN 16 12/26/2023   CREATININE 0.87 12/26/2023   CALCIUM 9.1 12/26/2023   EGFR 85 12/26/2023   GFRNONAA 97 03/05/2018   Lab Results  Component Value Date   CHOL 175 12/26/2023   HDL 38 (L) 12/26/2023   LDLCALC 116 (H) 12/26/2023   TRIG 114 12/26/2023   CHOLHDL 3.5 12/13/2016   Lab Results  Component Value Date   TSH 1.630 02/13/2023   Lab Results  Component Value Date   HGBA1C 5.3 12/26/2023   Lab Results  Component Value Date   WBC 7.1 02/13/2023   HGB 14.0 02/13/2023   HCT 42.7 02/13/2023   MCV 93 02/13/2023   PLT 289 02/13/2023   Lab Results  Component Value Date   ALT 16 12/26/2023   AST 17 12/26/2023   ALKPHOS 76 12/26/2023   BILITOT 0.4 12/26/2023   Lab Results  Component Value Date   VD25OH 34.8 12/26/2023     Patient Active Problem List   Diagnosis Date Noted   BMI 38.0-38.9,adult 03/13/2023   BRCA gene mutation negative in female 03/06/2023   PCOS (polycystic ovarian syndrome) 03/06/2023   Gastroesophageal reflux disease 02/26/2022   Recurrent major depression in partial remission 11/18/2017   Vitamin D  deficiency 12/13/2016   Other seasonal allergic rhinitis 12/03/2015   Family hx of ovarian malignancy 12/03/2015    Allergies  Allergen Reactions   Almond (Diagnostic) Hives and Swelling   Cat Dander Itching   Celery Oil Swelling   Codeine Nausea Only   Mixed Feathers Itching   Peanut (Diagnostic) Other (See Comments)    Allergy testing   Peanut-Containing Drug Products Other (See Comments)    Allergy testing    Pollen Extract Swelling   Tree Extract Hives and Swelling    Past Surgical History:  Procedure Laterality Date   CHOLECYSTECTOMY,  LAPAROSCOPIC  02/24/2021   SKIN CANCER DESTRUCTION Left 2008   Breast   WISDOM TOOTH EXTRACTION      Social History   Tobacco Use   Smoking status: Former    Current packs/day: 0.00    Average packs/day: 0.5 packs/day for 4.0 years (2.0 ttl pk-yrs)    Types: Cigarettes    Start date: 07/10/1999    Quit date: 07/10/2003    Years since quitting: 20.9   Smokeless tobacco: Never  Vaping Use   Vaping status: Never Used  Substance Use Topics   Alcohol use: Yes    Alcohol/week: 2.0 standard drinks of alcohol    Types: 1 Glasses of wine, 1 Cans of beer per week    Comment: socially   Drug use: No     Medication list has been reviewed and updated.  No outpatient medications have been marked as taking for the 06/08/24 encounter (Orders Only) with Justus Leita DEL, MD.       04/22/2024    3:30 PM 04/02/2024   10:39 AM 09/20/2023   10:27 AM 07/17/2023    2:47 PM  GAD 7 : Generalized Anxiety Score  Nervous, Anxious, on Edge 2 3 1  0  Control/stop worrying 2 3 0 0  Worry too  much - different things 2 3 0 0  Trouble relaxing 2 2 0 0  Restless 0 0 0 0  Easily annoyed or irritable 1 1 0 0  Afraid - awful might happen 0 0 1 0  Total GAD 7 Score 9 12 2  0  Anxiety Difficulty Very difficult Very difficult  Not difficult at all       04/22/2024    3:30 PM 04/02/2024   10:39 AM 09/20/2023   10:27 AM  Depression screen PHQ 2/9  Decreased Interest 0 0 1  Down, Depressed, Hopeless 0 0 0  PHQ - 2 Score 0 0 1  Altered sleeping 0 1 1  Tired, decreased energy 0 1 2  Change in appetite 0 0 0  Feeling bad or failure about yourself  0 0 0  Trouble concentrating 0 1 1  Moving slowly or fidgety/restless 0 0 0  Suicidal thoughts 0 0 0  PHQ-9 Score 0  3  5   Difficult doing work/chores Not difficult at all Not difficult at all      Data saved with a previous flowsheet row definition    BP Readings from Last 3 Encounters:  05/14/24 107/64  04/22/24 128/78  04/02/24 122/60    Physical  Exam  Wt Readings from Last 3 Encounters:  05/14/24 210 lb (95.3 kg)  04/22/24 218 lb 6.4 oz (99.1 kg)  04/02/24 222 lb (100.7 kg)    There were no vitals taken for this visit.  Assessment and Plan:  Problem List Items Addressed This Visit   None   No follow-ups on file.    Leita HILARIO Adie, MD [facility] [place]

## 2024-06-09 NOTE — Telephone Encounter (Signed)
 Discontinued 06/08/24, dose change.  Requested Prescriptions  Pending Prescriptions Disp Refills   ZEPBOUND  2.5 MG/0.5ML injection vial [Pharmacy Med Name: ZEPBOUND  2.5 MG/0.5ML SUBCUTANEOUS SOLUTION] 2 mL 0    Sig: INJECT 0.5 ML (2.5 MG) UNDER THE SKIN ONCE WEEKLY (0.5ML= 50 UNITS)     Off-Protocol Failed - 06/09/2024  3:57 PM      Failed - Medication not assigned to a protocol, review manually.      Passed - Valid encounter within last 12 months    Recent Outpatient Visits           1 month ago Recurrent major depression in partial remission   [place] [facility] at [facility], Leita DEL, MD   2 months ago Recurrent major depressive disorder, in partial remission   [place] [facility] at [facility], Leita DEL, MD   8 months ago Sacroiliac joint pain   [facility] at [facility], Leita DEL, MD

## 2024-06-15 ENCOUNTER — Other Ambulatory Visit (INDEPENDENT_AMBULATORY_CARE_PROVIDER_SITE_OTHER): Payer: Self-pay | Admitting: Family Medicine

## 2024-06-15 DIAGNOSIS — E559 Vitamin D deficiency, unspecified: Secondary | ICD-10-CM

## 2024-06-15 NOTE — Progress Notes (Unsigned)
 SUBJECTIVE: Discussed the use of AI scribe software for clinical note transcription with the patient, who gave verbal consent to proceed.  Chief Complaint: Obesity  Interim History: She is down 6 lbs since her last visit.  Down 24 lbs overall TBW loss of 10.5%  Leah Brown is here to discuss her progress with her obesity treatment plan. She is on the Category 3 Plan and keeping a food journal and adhering to recommended goals of 1500-1600 calories and 100 grams of protein and states she is following her eating plan approximately 25 % of the time. She states she is not exercising. Leah Brown is a 43 year old female with obesity who presents for follow-up of her obesity treatment plan.  She is on a regimen of Zepbound  5 mg weekly for medical weight loss, bupropion  SR 150 mg twice daily, and Celexa  10 mg daily for depression with emotional eating. She also takes ergocalciferol  50,000 units once weekly for vitamin D  deficiency.  She experiences a significant decrease in 'food noise' and cravings, attributing this improvement to her current medication regimen. She logs her calorie intake using the 'Lose It' app, aiming for 1,200 to 1,700 calories daily and strives to consume 100 grams of protein each day. She experiences increased sensitivity to smells, particularly to unpleasant odors, which she attributes to her medication. No nausea or significant gastrointestinal side effects, although she had mild constipation which improved with increased fiber intake and adequate hydration.  She uses a 32-ounce water bottle and drinks about three of those a day, which is approximately 96 ounces of water. Her household consists of her and her husband, who is supportive of her dietary changes, although he has had to adjust his own habits, such as reducing ice cream consumption. Her goal weight is between 150 to 170 pounds. She identifies social situations, particularly during holidays, as challenging for  maintaining her dietary goals. She has had to communicate her health priorities to friends and family to avoid temptations.  She has not been exercising regularly but is considering resuming activities like circuit training at [employer], which she has enjoyed in the past.   Pharmacotherapy: Started Zepbound  2.5 mg 05/15/24- prescribed by PCP.       OBJECTIVE: Visit Diagnoses: Problem List Items Addressed This Visit     Vitamin D  deficiency   Relevant Medications   Vitamin D , Ergocalciferol , (DRISDOL ) 1.25 MG (50000 UNIT) CAPS capsule   Other Relevant Orders   VITAMIN D  25 Hydroxy (Vit-D Deficiency, Fractures)   Other Visit Diagnoses       Prediabetes    -  Primary   Relevant Orders   Hemoglobin A1c   Insulin , random   Lipid Panel With LDL/HDL Ratio   CMP14+EGFR     Other depression, with Emotional Eating Behavior         Obesity, starting BMI 40.39         BMI 36.0-36.9,adult Current BMI 36.1         Obesity Management with Zepbound  5 mg weekly has resulted in significant weight loss and improved metabolic parameters. She reports decreased appetite and food noise, with no significant side effects except increased sensitivity to smells. Current weight is 92 kg with a goal of 150-170 kg. Muscle mass has slightly increased, and visceral adipose rating has improved to 10, indicating reduced risk of chronic diseases. Discussed potential side effects of increasing Zepbound  dose, including gastrointestinal issues and muscle loss. Consideration of maintaining current dose to minimize side effects  and costs. - Continue Zepbound  5 mg weekly. - Consider maintaining current dose by using half of the 5 mg vial to reduce costs and side effects. - Encouraged continued use of Lose It for calorie tracking. - Encouraged maintaining protein intake of 90-100 grams per day. - Encouraged walking 20-30 minutes after evening meals to control glucose spikes. - Encouraged consideration of  strengthening exercises to support weight loss efforts.  Polycystic ovary syndrome PCOS is contributing to obesity and insulin  resistance. Zepbound 's action on insulin  receptors is beneficial for managing PCOS symptoms and improving insulin  sensitivity. - Continue current management with Zepbound .  Prediabetes Improved metabolic parameters due to weight loss and Zepbound  use. Discussed the importance of maintaining glucose control to prevent progression to type 2 diabetes. - Ordered A1c, insulin , and lipid panel to monitor glucose control and metabolic health.  Major depressive disorder with emotional eating Managed with bupropion  SR 150 mg twice daily and Celexa  10 mg daily. Emotional eating behavior is being addressed through medication and lifestyle changes. - Continue bupropion  SR 150 mg twice daily. - Continue Celexa  10 mg daily.  Vitamin D  deficiency Managed with ergocalciferol  50,000 units weekly. No reported issues with current supplementation. - Refilled ergocalciferol  50,000 units weekly. - Ordered vitamin D  level to monitor supplementation efficacy.  General Health Maintenance Discussed the importance of maintaining a healthy lifestyle, including nutrition and exercise, to support overall health and weight management. - Encouraged continued use of multivitamins. - Ordered B12 level to ensure adequate nutrition. - Encouraged consideration of protein shots for additional protein intake.  Vitals Temp: 98.7 F (37.1 C) BP: 104/61 Pulse Rate: 78 SpO2: 99 %   Anthropometric Measurements Height: 5' 3 (1.6 m) Weight: 204 lb (92.5 kg) BMI (Calculated): 36.15 Weight at Last Visit: 210 lb Weight Lost Since Last Visit: 6 lb Weight Gained Since Last Visit: 0 Starting Weight: 228 lb Total Weight Loss (lbs): 24 lb (10.9 kg) Peak Weight: 235 lb   Body Composition  Body Fat %: 42.4 % Fat Mass (lbs): 86.4 lbs Muscle Mass (lbs): 111.6 lbs Total Body Water (lbs): 81.2  lbs Visceral Fat Rating : 10   Other Clinical Data Fasting: yes Labs: yes Today's Visit #: 19 Starting Date: 02/13/23     ASSESSMENT AND PLAN:  Diet: Leah Brown is currently in the action stage of change. As such, her goal is to continue with weight loss efforts. She has agreed to keeping a food journal and adhering to recommended goals of 1500-1600 calories and 100 grams of protein.  Exercise: Leah Brown has been instructed to work up to a goal of 150 minutes of combined cardio and strengthening exercise per week and that some exercise is better than none for weight loss and overall health benefits.   Behavior Modification:  We discussed the following Behavioral Modification Strategies today: increasing lean protein intake, decreasing simple carbohydrates, increasing vegetables, increase H2O intake, increase high fiber foods, meal planning and cooking strategies, emotional eating strategies , holiday eating strategies, avoiding temptations, planning for success, and keep a strict food journal. We discussed various medication options to help Leah Brown with her weight loss efforts and we both agreed to continue Zepbound  5 mg weekly for medical weight loss.  Return in about 4 weeks (around 07/15/2024).SABRA She was informed of the importance of frequent follow up visits to maximize her success with intensive lifestyle modifications for her multiple health conditions.  Attestation Statements:   Reviewed by clinician on day of visit: allergies, medications, problem list, medical history, surgical  history, family history, social history, and previous encounter notes.   Time spent on visit including pre-visit chart review and post-visit care and charting was 31 minutes.    Alcus Bradly, PA-C

## 2024-06-17 ENCOUNTER — Encounter (INDEPENDENT_AMBULATORY_CARE_PROVIDER_SITE_OTHER): Payer: Self-pay | Admitting: Physician Assistant

## 2024-06-17 ENCOUNTER — Ambulatory Visit (INDEPENDENT_AMBULATORY_CARE_PROVIDER_SITE_OTHER): Admitting: Physician Assistant

## 2024-06-17 VITALS — BP 104/61 | HR 78 | Temp 98.7°F | Ht 63.0 in | Wt 204.0 lb

## 2024-06-17 DIAGNOSIS — E669 Obesity, unspecified: Secondary | ICD-10-CM

## 2024-06-17 DIAGNOSIS — F5089 Other specified eating disorder: Secondary | ICD-10-CM

## 2024-06-17 DIAGNOSIS — E559 Vitamin D deficiency, unspecified: Secondary | ICD-10-CM | POA: Diagnosis not present

## 2024-06-17 DIAGNOSIS — Z6841 Body Mass Index (BMI) 40.0 and over, adult: Secondary | ICD-10-CM

## 2024-06-17 DIAGNOSIS — F339 Major depressive disorder, recurrent, unspecified: Secondary | ICD-10-CM | POA: Diagnosis not present

## 2024-06-17 DIAGNOSIS — R7303 Prediabetes: Secondary | ICD-10-CM

## 2024-06-17 DIAGNOSIS — F3289 Other specified depressive episodes: Secondary | ICD-10-CM

## 2024-06-17 DIAGNOSIS — Z6836 Body mass index (BMI) 36.0-36.9, adult: Secondary | ICD-10-CM

## 2024-06-17 DIAGNOSIS — E282 Polycystic ovarian syndrome: Secondary | ICD-10-CM | POA: Diagnosis not present

## 2024-06-17 MED ORDER — VITAMIN D (ERGOCALCIFEROL) 1.25 MG (50000 UNIT) PO CAPS: 50000.0000 [IU] | ORAL_CAPSULE | ORAL | 0 refills | Status: DC

## 2024-06-18 LAB — LIPID PANEL WITH LDL/HDL RATIO
Cholesterol, Total: 168 mg/dL (ref 100–199)
HDL: 38 mg/dL — ABNORMAL LOW (ref >39)
LDL Chol Calc (NIH): 115 mg/dL — ABNORMAL HIGH (ref 0–99)
LDL/HDL Ratio: 3 ratio (ref 0.0–3.2)
Triglycerides: 80 mg/dL (ref 0–149)
VLDL Cholesterol Cal: 15 mg/dL (ref 5–40)

## 2024-06-18 LAB — CMP14+EGFR
ALT: 18 IU/L (ref 0–32)
AST: 16 IU/L (ref 0–40)
Albumin: 4.5 g/dL (ref 3.9–4.9)
Alkaline Phosphatase: 75 IU/L (ref 41–116)
BUN/Creatinine Ratio: 13 (ref 9–23)
BUN: 12 mg/dL (ref 6–24)
Bilirubin Total: 0.6 mg/dL (ref 0.0–1.2)
CO2: 20 mmol/L (ref 20–29)
Calcium: 9.4 mg/dL (ref 8.7–10.2)
Chloride: 101 mmol/L (ref 96–106)
Creatinine, Ser: 0.91 mg/dL (ref 0.57–1.00)
Globulin, Total: 2.4 g/dL (ref 1.5–4.5)
Glucose: 83 mg/dL (ref 70–99)
Potassium: 4 mmol/L (ref 3.5–5.2)
Sodium: 136 mmol/L (ref 134–144)
Total Protein: 6.9 g/dL (ref 6.0–8.5)
eGFR: 81 mL/min/1.73 (ref >59)

## 2024-06-18 LAB — INSULIN, RANDOM: INSULIN: 9.6 u[IU]/mL (ref 2.6–24.9)

## 2024-06-18 LAB — HEMOGLOBIN A1C
Est. average glucose Bld gHb Est-mCnc: 105 mg/dL
Hgb A1c MFr Bld: 5.3 % (ref 4.8–5.6)

## 2024-06-18 LAB — VITAMIN D 25 HYDROXY (VIT D DEFICIENCY, FRACTURES): Vit D, 25-Hydroxy: 43.4 ng/mL (ref 30.0–100.0)

## 2024-06-22 ENCOUNTER — Ambulatory Visit (INDEPENDENT_AMBULATORY_CARE_PROVIDER_SITE_OTHER): Payer: Self-pay | Admitting: Physician Assistant

## 2024-06-24 ENCOUNTER — Encounter: Payer: Self-pay | Admitting: Internal Medicine

## 2024-06-24 ENCOUNTER — Ambulatory Visit: Admitting: Internal Medicine

## 2024-06-24 VITALS — BP 122/72 | HR 88 | Ht 63.0 in | Wt 204.0 lb

## 2024-06-24 DIAGNOSIS — Z6838 Body mass index (BMI) 38.0-38.9, adult: Secondary | ICD-10-CM

## 2024-06-24 DIAGNOSIS — E282 Polycystic ovarian syndrome: Secondary | ICD-10-CM | POA: Diagnosis not present

## 2024-06-24 DIAGNOSIS — Z1231 Encounter for screening mammogram for malignant neoplasm of breast: Secondary | ICD-10-CM | POA: Diagnosis not present

## 2024-06-24 MED ORDER — TIRZEPATIDE-WEIGHT MANAGEMENT 5 MG/0.5ML ~~LOC~~ SOLN: 5.0000 mg | SUBCUTANEOUS | 0 refills | Status: DC

## 2024-06-24 NOTE — Assessment & Plan Note (Addendum)
 Doing well on Zepbound .  Now on 5 mg.  Will continue this dose for now and give one refill. She will follow up with [name] and they will assume the prescribing.

## 2024-06-24 NOTE — Assessment & Plan Note (Signed)
 Prediabetes resolved with weight loss and diet changes. No longer on MTF.

## 2024-06-24 NOTE — Progress Notes (Signed)
 Date:  06/24/2024   Name:  Leah Brown   DOB:  Mar 01, 1981   MRN:  [id number]   Chief Complaint: Weight Check  HPI Weight management - on Zepbound ; current weight is down 6 pounds from starting dose 2 months ago.  No side effects noted,  She continues to see [name] regularly as well.  The are willing to take over the Zepbound  Rx so after the next refill they will assume. Prediabetes/PCOS improved with weight loss and diet changes.  Review of Systems  Constitutional:  Negative for chills, fatigue and fever.  Respiratory:  Negative for chest tightness and shortness of breath.   Cardiovascular:  Negative for chest pain and palpitations.  Gastrointestinal:  Positive for nausea (mild nausea with certain odors). Negative for abdominal distention, diarrhea and vomiting.  Psychiatric/Behavioral:  Negative for dysphoric mood and sleep disturbance. The patient is not nervous/anxious.      Lab Results  Component Value Date   NA 136 06/17/2024   K 4.0 06/17/2024   CO2 20 06/17/2024   GLUCOSE 83 06/17/2024   BUN 12 06/17/2024   CREATININE 0.91 06/17/2024   CALCIUM 9.4 06/17/2024   EGFR 81 06/17/2024   GFRNONAA 97 03/05/2018   Lab Results  Component Value Date   CHOL 168 06/17/2024   HDL 38 (L) 06/17/2024   LDLCALC 115 (H) 06/17/2024   TRIG 80 06/17/2024   CHOLHDL 3.5 12/13/2016   Lab Results  Component Value Date   TSH 1.630 02/13/2023   Lab Results  Component Value Date   HGBA1C 5.3 06/17/2024   Lab Results  Component Value Date   WBC 7.1 02/13/2023   HGB 14.0 02/13/2023   HCT 42.7 02/13/2023   MCV 93 02/13/2023   PLT 289 02/13/2023   Lab Results  Component Value Date   ALT 18 06/17/2024   AST 16 06/17/2024   ALKPHOS 75 06/17/2024   BILITOT 0.6 06/17/2024   Lab Results  Component Value Date   VD25OH 43.4 06/17/2024     Patient Active Problem List   Diagnosis Date Noted   BMI 38.0-38.9,adult 03/13/2023   BRCA gene mutation negative in female  03/06/2023   PCOS (polycystic ovarian syndrome) 03/06/2023   Gastroesophageal reflux disease 02/26/2022   Recurrent major depression in partial remission 11/18/2017   Vitamin D  deficiency 12/13/2016   Other seasonal allergic rhinitis 12/03/2015   Family hx of ovarian malignancy 12/03/2015    Allergies[1]  Past Surgical History:  Procedure Laterality Date   CHOLECYSTECTOMY, LAPAROSCOPIC  02/24/2021   SKIN CANCER DESTRUCTION Left 2008   Breast   WISDOM TOOTH EXTRACTION      Social History[2]   Medication list has been reviewed and updated.  Active Medications[3]     04/22/2024    3:30 PM 04/02/2024   10:39 AM 09/20/2023   10:27 AM 07/17/2023    2:47 PM  GAD 7 : Generalized Anxiety Score  Nervous, Anxious, on Edge 2 3 1  0  Control/stop worrying 2 3 0 0  Worry too much - different things 2 3 0 0  Trouble relaxing 2 2 0 0  Restless 0 0 0 0  Easily annoyed or irritable 1 1 0 0  Afraid - awful might happen 0 0 1 0  Total GAD 7 Score 9 12 2  0  Anxiety Difficulty Very difficult Very difficult  Not difficult at all       04/22/2024    3:30 PM 04/02/2024   10:39 AM 09/20/2023  10:27 AM  Depression screen PHQ 2/9  Decreased Interest 0 0 1  Down, Depressed, Hopeless 0 0 0  PHQ - 2 Score 0 0 1  Altered sleeping 0 1 1  Tired, decreased energy 0 1 2  Change in appetite 0 0 0  Feeling bad or failure about yourself  0 0 0  Trouble concentrating 0 1 1  Moving slowly or fidgety/restless 0 0 0  Suicidal thoughts 0 0 0  PHQ-9 Score 0  3  5   Difficult doing work/chores Not difficult at all Not difficult at all      Data saved with a previous flowsheet row definition    BP Readings from Last 3 Encounters:  06/24/24 122/72  06/17/24 104/61  05/14/24 107/64    Physical Exam Vitals and nursing note reviewed.  Constitutional:      General: She is not in acute distress.    Appearance: Normal appearance. She is well-developed.  HENT:     Head: Normocephalic and atraumatic.   Cardiovascular:     Rate and Rhythm: Normal rate and regular rhythm.  Pulmonary:     Effort: Pulmonary effort is normal. No respiratory distress.     Breath sounds: No wheezing or rhonchi.  Musculoskeletal:     Cervical back: Normal range of motion.     Right lower leg: No edema.     Left lower leg: No edema.  Lymphadenopathy:     Cervical: No cervical adenopathy.  Skin:    General: Skin is warm and dry.     Findings: No rash.  Neurological:     General: No focal deficit present.     Mental Status: She is alert and oriented to person, place, and time.  Psychiatric:        Mood and Affect: Mood normal.        Behavior: Behavior normal.     Wt Readings from Last 3 Encounters:  06/24/24 204 lb (92.5 kg)  06/17/24 204 lb (92.5 kg)  05/14/24 210 lb (95.3 kg)    BP 122/72   Pulse 88   Ht 5' 3 (1.6 m)   Wt 204 lb (92.5 kg)   SpO2 97%   BMI 36.14 kg/m   Assessment and Plan:  Problem List Items Addressed This Visit       Unprioritized   PCOS (polycystic ovarian syndrome) - Primary   Prediabetes resolved with weight loss and diet changes. No longer on MTF.      BMI 38.0-38.9,adult   Doing well on Zepbound .  Now on 5 mg.  Will continue this dose for now and give one refill. She will follow up with [name] and they will assume the prescribing.       Relevant Medications   tirzepatide  5 MG/0.5ML injection vial   Other Visit Diagnoses       Encounter for screening mammogram for breast cancer       patient reminded to schedule mammogram       No follow-ups on file.    Leita HILARIO Adie, MD [facility] [place]           [1]  Allergies Allergen Reactions   Almond (Diagnostic) Hives and Swelling   Cat Dander Itching   Celery Oil Swelling   Codeine Nausea Only   Mixed Feathers Itching   Peanut (Diagnostic) Other (See Comments)    Allergy testing   Peanut-Containing Drug Products Other (See Comments)    Allergy  testing    Pollen Extract Swelling   Tree Extract Hives and Swelling  [2]  Social History Tobacco Use   Smoking status: Former    Current packs/day: 0.00    Average packs/day: 0.5 packs/day for 4.0 years (2.0 ttl pk-yrs)    Types: Cigarettes    Start date: 07/10/1999    Quit date: 07/10/2003    Years since quitting: 20.9   Smokeless tobacco: Never  Vaping Use   Vaping status: Never Used  Substance Use Topics   Alcohol use: Yes    Alcohol/week: 2.0 standard drinks of alcohol    Types: 1 Glasses of wine, 1 Cans of beer per week    Comment: socially   Drug use: No  [3]  Current Meds  Medication Sig   Bacillus Coagulans-Inulin (PROBIOTIC-PREBIOTIC PO) Take by mouth daily.   buPROPion  (WELLBUTRIN  SR) 150 MG 12 hr tablet Take 1 tablet (150 mg total) by mouth 2 (two) times daily.   caffeine 200 MG TABS tablet Take 100 mg by mouth daily as needed.   cetirizine (ZYRTEC) 10 MG tablet Take 10 mg by mouth as needed.    citalopram  (CELEXA ) 10 MG tablet Take 1 tablet (10 mg total) by mouth daily.   Multiple Vitamin (MULTIVITAMIN WITH MINERALS) TABS tablet Take 1 tablet by mouth daily.   traZODone  (DESYREL ) 50 MG tablet Take 1 tablet (50 mg total) by mouth at bedtime.   Vitamin D , Ergocalciferol , (DRISDOL ) 1.25 MG (50000 UNIT) CAPS capsule Take 1 capsule (50,000 Units total) by mouth every 7 (seven) days.   [DISCONTINUED] tirzepatide  5 MG/0.5ML injection vial Inject 5 mg into the skin once a week.

## 2024-06-24 NOTE — Patient Instructions (Signed)
 Call [facility] to schedule your mammogram at [phone].

## 2024-07-21 NOTE — Progress Notes (Unsigned)
 "  SUBJECTIVE: Discussed the use of AI scribe software for clinical note transcription with the patient, who gave verbal consent to proceed.  Chief Complaint: Obesity  Interim History: She is down 9 lbs since last visit. Down 33 lbs overall TBW loss of 14.5%  Leah Brown is here to discuss her progress with her obesity treatment plan. She is on the keeping a food journal and adhering to recommended goals of 1300-1700 calories and 100 grams of protein and states she is following her eating plan approximately 100 % of the time. She states she is exercising 30 minutes 3 times per week.  Leah Brown is a 44 year old female who presents for follow-up of her BCD treatment plan.  She is consistently adhering to her category 3 journaling plan and has achieved a total weight loss of 33 pounds, with 9 pounds lost since her last visit. Her current medications include tirzepatide  5 mg weekly, bupropion  SR 150 mg twice daily, and ergocalciferol  50,000 units once weekly.  She is diligent with food logging, ensuring adequate protein intake, and is working on increasing her fiber intake, primarily through beans, due to slower bowel movements and less frequent fruit consumption.  In February of the previous year, she participated in a study for PCOS where her resting energy expenditure was calculated at 2065 calories using a Canopy device for measurement. Our subsequent IC test in August 2025 showed a decrease of almost 325 calories in her resting energy expenditure at 1742. Her current approach of calorie goal of 1300-1700 with 100 grams of protein daily should work with calorie range closer to 1300 daily to keep in calorie deficit range if her REE is 1742. She has had clinically significant weight loss with this approach.   She exercises three days a week, incorporating walking during TV commercials and lunch breaks, and using a kettlebell at home. She attempted to go to the gym but found it too crowded  and is considering adjusting her schedule to avoid peak times.  She experienced nausea after increasing her Zepbound  dose to 5 mg, so she adjusted to approximately 3 mg, which alleviated the nausea while maintaining hunger cues and reducing cravings. She is managing her dosing to extend her supply.  Recent lab results show normal electrolytes, liver function, and an A1c of 5.3. Her cholesterol is slightly low, and LDL is elevated. She continues on vitamin D  supplementation without side effects, with a recent blood level of 43.  OBJECTIVE: Visit Diagnoses: Problem List Items Addressed This Visit     Vitamin D  deficiency   Relevant Medications   Vitamin D , Ergocalciferol , (DRISDOL ) 1.25 MG (50000 UNIT) CAPS capsule   Other Visit Diagnoses       Prediabetes    -  Primary     Other hyperlipidemia         Other depression, with Emotional Eating Behavior         Obesity, starting BMI 40.39         BMI 34.0-34.9,adult Current BMI 34.6         Class 3 obesity Significant weight loss of 33 pounds, with 9 pounds lost since the last visit, with nearly 6 lbs of this being adipose loss. Current regimen includes tirzepatide  ~3 mg weekly, bupropion  SR 150 mg twice daily, and ergocalciferol  50,000 units weekly. She is a super responder to tirzepatide  showing clinically significant weight loss at very low dose and experiencing nausea at higher doses, and has adjusted her dose to avoid  side effects.  Body composition is improving with a decrease in body fat percentage and an increase in muscle mass.  Exercise regimen includes walking, kettlebell exercises, and core strengthening activities. No significant side effects from current medications otherwise. - Continue tirzepatide  at adjusted dose ~ 3mg  weekly. Previously prescribed by PCP, but provider at [facility] will take responsibility for future prescriptions. She does not currently require a refill.  - Continue bupropion  SR 150 mg twice daily. - Continue  ergocalciferol  50,000 units weekly. - Encouraged continued exercise and dietary modifications. - Scheduled follow-up in 8 weeks.  Vitamin D  deficiency Vitamin D  levels previously low, currently managed with ergocalciferol  50,000 units weekly. Last vitamin D  level was 43, indicating improvement. Last vitamin D  Lab Results  Component Value Date   VD25OH 43.4 06/17/2024   Low vitamin D  levels can be associated with adiposity and may result in leptin resistance and weight gain. Also associated with fatigue.  Currently on vitamin D  supplementation without any adverse effects such as nausea, vomiting or muscle weakness.  - Continue ergocalciferol  50,000 units weekly. - Will reassess vitamin D  levels periodically to avoid over supplementation.  Meds ordered this encounter  Medications   Vitamin D , Ergocalciferol , (DRISDOL ) 1.25 MG (50000 UNIT) CAPS capsule    Sig: Take 1 capsule (50,000 Units total) by mouth every 7 (seven) days.    Dispense:  4 capsule    Refill:  0    Other hyperlipidemia Cholesterol levels are slightly low, with LDL elevated. Exercise and weight loss are expected to improve lipid profile. Tirzepatide  may also contribute to lowering LDL over time. Not on statin therapy.  Lab Results  Component Value Date   CHOL 168 06/17/2024   CHOL 175 12/26/2023   CHOL 201 (H) 02/13/2023   Lab Results  Component Value Date   HDL 38 (L) 06/17/2024   HDL 38 (L) 12/26/2023   HDL 55 02/13/2023   Lab Results  Component Value Date   LDLCALC 115 (H) 06/17/2024   LDLCALC 116 (H) 12/26/2023   LDLCALC 127 (H) 02/13/2023   Lab Results  Component Value Date   TRIG 80 06/17/2024   TRIG 114 12/26/2023   TRIG 109 02/13/2023   Lab Results  Component Value Date   CHOLHDL 3.5 12/13/2016  The 10-year ASCVD risk score (Arnett DK, et al., 2019) is: 0.6%   Values used to calculate the score:     Age: 18 years     Clinically relevant sex: Female     Is Non-Hispanic African American:  No     Diabetic: No     Tobacco smoker: No     Systolic Blood Pressure: 101 mmHg     Is BP treated: No     HDL Cholesterol: 38 mg/dL     Total Cholesterol: 168 mg/dL  No results found for: LDLDIRECT Continue to work on nutrition plan -decreasing simple carbohydrates, increasing lean proteins, decreasing saturated fats and cholesterol , avoiding trans fats and exercise as able to promote weight loss, improve lipids and decrease cardiovascular risks. Statin therapy not indicated by current risk score.  - Encouraged regular exercise to improve lipid profile. - Will monitor lipid levels in future visits.  Prediabetes A1c is 5.3, indicating good control. Insulin  levels have decreased significantly, likely due to tirzepatide  and dietary modifications. Continued focus on reducing simple carbohydrates is advised. Lab Results  Component Value Date   HGBA1C 5.3 06/17/2024   HGBA1C 5.3 12/26/2023   HGBA1C 5.7 (H) 02/13/2023   Lab  Results  Component Value Date   LDLCALC 115 (H) 06/17/2024   CREATININE 0.91 06/17/2024   INSULIN   Date Value Ref Range Status  06/17/2024 9.6 2.6 - 24.9 uIU/mL Final  ]A1c improved and at goal. Insulin  improved, but not at goal.  Continue working on nutrition plan to decrease simple carbohydrates, increase lean proteins and exercise to promote weight loss, improve glycemic control and prevent progression to Type 2 diabetes.  - Continue current dietary and medication regimen.- Zepbound  ~ 3mg  weekly and monitor.  - Monitor A1c and insulin  levels in future visits.  Vitals Temp: 98.7 F (37.1 C) BP: 101/67 Pulse Rate: 77 SpO2: 98 %   Anthropometric Measurements Height: 5' 3 (1.6 m) Weight: 195 lb (88.5 kg) BMI (Calculated): 34.55 Weight at Last Visit: 204 lb Weight Lost Since Last Visit: 9 Weight Gained Since Last Visit: 0 Starting Weight: 228 lb Total Weight Loss (lbs): 33 lb (15 kg)   Body Composition  Body Fat %: 41.3 % Fat Mass (lbs): 80.6  lbs Muscle Mass (lbs): 108.8 lbs Total Body Water (lbs): 80.8 lbs Visceral Fat Rating : 10   Other Clinical Data Today's Visit #: 20 Starting Date: 02/13/23     ASSESSMENT AND PLAN:  Diet: Betrice is currently in the action stage of change. As such, her goal is to continue with weight loss efforts. She has agreed to keeping a food journal and adhering to recommended goals of 1500-1700 calories and 100 grams of protein.  Exercise: Saraiah has been instructed to work up to a goal of 150 minutes of combined cardio and strengthening exercise per week and to continue exercising as is for weight loss and overall health benefits.   Behavior Modification:  We discussed the following Behavioral Modification Strategies today: increasing lean protein intake, decreasing simple carbohydrates, increasing vegetables, increase H2O intake, increase high fiber foods, no skipping meals, meal planning and cooking strategies, avoiding temptations, planning for success, and keep a strict food journal. We discussed various medication options to help Colletta with her weight loss efforts and we both agreed to continue Zepbound  for medical weight loss.  Return in about 8 weeks (around 09/16/2024).SABRA She was informed of the importance of frequent follow up visits to maximize her success with intensive lifestyle modifications for her multiple health conditions.  Attestation Statements:   Reviewed by clinician on day of visit: allergies, medications, problem list, medical history, surgical history, family history, social history, and previous encounter notes.   Time spent on visit including pre-visit chart review and post-visit care and charting was 43 minutes.    Ewart Carrera, PA-C  "

## 2024-07-22 ENCOUNTER — Ambulatory Visit (INDEPENDENT_AMBULATORY_CARE_PROVIDER_SITE_OTHER): Admitting: Physician Assistant

## 2024-07-22 ENCOUNTER — Encounter (INDEPENDENT_AMBULATORY_CARE_PROVIDER_SITE_OTHER): Payer: Self-pay | Admitting: Physician Assistant

## 2024-07-22 VITALS — BP 101/67 | HR 77 | Temp 98.7°F | Ht 63.0 in | Wt 195.0 lb

## 2024-07-22 DIAGNOSIS — R7303 Prediabetes: Secondary | ICD-10-CM | POA: Diagnosis not present

## 2024-07-22 DIAGNOSIS — E66813 Obesity, class 3: Secondary | ICD-10-CM

## 2024-07-22 DIAGNOSIS — E7849 Other hyperlipidemia: Secondary | ICD-10-CM | POA: Diagnosis not present

## 2024-07-22 DIAGNOSIS — Z6834 Body mass index (BMI) 34.0-34.9, adult: Secondary | ICD-10-CM | POA: Diagnosis not present

## 2024-07-22 DIAGNOSIS — E559 Vitamin D deficiency, unspecified: Secondary | ICD-10-CM

## 2024-07-22 DIAGNOSIS — F3289 Other specified depressive episodes: Secondary | ICD-10-CM

## 2024-07-22 MED ORDER — VITAMIN D (ERGOCALCIFEROL) 1.25 MG (50000 UNIT) PO CAPS: 50000.0000 [IU] | ORAL_CAPSULE | ORAL | 0 refills | Status: AC

## 2024-07-27 ENCOUNTER — Encounter (INDEPENDENT_AMBULATORY_CARE_PROVIDER_SITE_OTHER): Payer: Self-pay | Admitting: Physician Assistant

## 2024-07-28 ENCOUNTER — Other Ambulatory Visit (INDEPENDENT_AMBULATORY_CARE_PROVIDER_SITE_OTHER): Payer: Self-pay | Admitting: Physician Assistant

## 2024-07-28 DIAGNOSIS — Z6838 Body mass index (BMI) 38.0-38.9, adult: Secondary | ICD-10-CM

## 2024-07-28 MED ORDER — TIRZEPATIDE-WEIGHT MANAGEMENT 5 MG/0.5ML ~~LOC~~ SOLN: 5.0000 mg | SUBCUTANEOUS | 0 refills | Status: AC

## 2024-09-02 ENCOUNTER — Encounter: Admitting: Student

## 2024-09-16 ENCOUNTER — Ambulatory Visit (INDEPENDENT_AMBULATORY_CARE_PROVIDER_SITE_OTHER): Admitting: Physician Assistant
